# Patient Record
Sex: Female | Born: 1935 | Race: White | Hispanic: No | Marital: Married | Smoking: Former smoker
Health system: Southern US, Community
[De-identification: ages and names within clinical notes are randomized; demographics above are authoritative.]

## PROBLEM LIST (undated history)

## (undated) DIAGNOSIS — E039 Hypothyroidism, unspecified: Secondary | ICD-10-CM

## (undated) DIAGNOSIS — K649 Unspecified hemorrhoids: Secondary | ICD-10-CM

## (undated) DIAGNOSIS — E871 Hypo-osmolality and hyponatremia: Secondary | ICD-10-CM

## (undated) DIAGNOSIS — L600 Ingrowing nail: Secondary | ICD-10-CM

## (undated) DIAGNOSIS — I10 Essential (primary) hypertension: Secondary | ICD-10-CM

## (undated) DIAGNOSIS — Z8639 Personal history of other endocrine, nutritional and metabolic disease: Secondary | ICD-10-CM

## (undated) DIAGNOSIS — N390 Urinary tract infection, site not specified: Secondary | ICD-10-CM

## (undated) DIAGNOSIS — H9190 Unspecified hearing loss, unspecified ear: Secondary | ICD-10-CM

## (undated) DIAGNOSIS — C4490 Unspecified malignant neoplasm of skin, unspecified: Secondary | ICD-10-CM

## (undated) DIAGNOSIS — J309 Allergic rhinitis, unspecified: Secondary | ICD-10-CM

## (undated) DIAGNOSIS — G4730 Sleep apnea, unspecified: Secondary | ICD-10-CM

## (undated) DIAGNOSIS — Z923 Personal history of irradiation: Secondary | ICD-10-CM

## (undated) DIAGNOSIS — C50919 Malignant neoplasm of unspecified site of unspecified female breast: Secondary | ICD-10-CM

## (undated) DIAGNOSIS — D751 Secondary polycythemia: Secondary | ICD-10-CM

## (undated) DIAGNOSIS — I773 Arterial fibromuscular dysplasia: Secondary | ICD-10-CM

## (undated) DIAGNOSIS — R42 Dizziness and giddiness: Secondary | ICD-10-CM

## (undated) DIAGNOSIS — G4733 Obstructive sleep apnea (adult) (pediatric): Secondary | ICD-10-CM

## (undated) DIAGNOSIS — H9201 Otalgia, right ear: Secondary | ICD-10-CM

## (undated) DIAGNOSIS — F0390 Unspecified dementia without behavioral disturbance: Secondary | ICD-10-CM

## (undated) HISTORY — PX: APPENDECTOMY: SHX54

## (undated) HISTORY — DX: Malignant neoplasm of unspecified site of unspecified female breast: C50.919

## (undated) HISTORY — DX: Secondary polycythemia: D75.1

## (undated) HISTORY — DX: Hypothyroidism, unspecified: E03.9

## (undated) HISTORY — DX: Allergic rhinitis, unspecified: J30.9

## (undated) HISTORY — DX: Unspecified hemorrhoids: K64.9

## (undated) HISTORY — DX: Arterial fibromuscular dysplasia: I77.3

## (undated) HISTORY — DX: Dizziness and giddiness: R42

## (undated) HISTORY — DX: Unspecified malignant neoplasm of skin, unspecified: C44.90

## (undated) HISTORY — DX: Essential (primary) hypertension: I10

## (undated) HISTORY — DX: Personal history of irradiation: Z92.3

## (undated) HISTORY — DX: Sleep apnea, unspecified: G47.30

## (undated) HISTORY — DX: Ingrowing nail: L60.0

## (undated) HISTORY — DX: Unspecified hearing loss, unspecified ear: H91.90

## (undated) HISTORY — DX: Otalgia, right ear: H92.01

## (undated) HISTORY — DX: Unspecified dementia, unspecified severity, without behavioral disturbance, psychotic disturbance, mood disturbance, and anxiety: F03.90

## (undated) HISTORY — DX: Hypo-osmolality and hyponatremia: E87.1

## (undated) HISTORY — DX: Urinary tract infection, site not specified: N39.0

## (undated) HISTORY — DX: Personal history of other endocrine, nutritional and metabolic disease: Z86.39

---

## 1995-05-04 HISTORY — PX: OTHER SURGICAL HISTORY: SHX169

## 2003-12-11 ENCOUNTER — Ambulatory Visit: Payer: Self-pay | Admitting: Internal Medicine

## 2004-09-17 ENCOUNTER — Ambulatory Visit: Payer: Self-pay | Admitting: Internal Medicine

## 2005-09-19 ENCOUNTER — Ambulatory Visit: Payer: Self-pay | Admitting: Internal Medicine

## 2006-02-07 ENCOUNTER — Ambulatory Visit: Payer: Self-pay | Admitting: Internal Medicine

## 2006-02-12 ENCOUNTER — Inpatient Hospital Stay: Payer: Self-pay | Admitting: Specialist

## 2006-02-12 ENCOUNTER — Other Ambulatory Visit: Payer: Self-pay

## 2006-07-25 ENCOUNTER — Ambulatory Visit: Payer: Self-pay | Admitting: Internal Medicine

## 2006-09-22 ENCOUNTER — Ambulatory Visit: Payer: Self-pay | Admitting: Internal Medicine

## 2007-02-12 ENCOUNTER — Ambulatory Visit: Payer: Self-pay | Admitting: Ophthalmology

## 2007-02-19 ENCOUNTER — Ambulatory Visit: Payer: Self-pay | Admitting: Ophthalmology

## 2007-07-18 ENCOUNTER — Ambulatory Visit: Payer: Self-pay | Admitting: Unknown Physician Specialty

## 2007-09-27 ENCOUNTER — Ambulatory Visit: Payer: Self-pay | Admitting: Internal Medicine

## 2008-01-25 HISTORY — PX: FRACTURE SURGERY: SHX138

## 2008-11-20 ENCOUNTER — Ambulatory Visit: Payer: Self-pay | Admitting: Internal Medicine

## 2009-01-24 DIAGNOSIS — Z923 Personal history of irradiation: Secondary | ICD-10-CM

## 2009-01-24 HISTORY — DX: Personal history of irradiation: Z92.3

## 2009-11-24 ENCOUNTER — Ambulatory Visit: Payer: Self-pay | Admitting: Internal Medicine

## 2009-12-08 ENCOUNTER — Ambulatory Visit: Payer: Self-pay | Admitting: Internal Medicine

## 2010-01-24 HISTORY — PX: OTHER SURGICAL HISTORY: SHX169

## 2010-01-24 HISTORY — PX: BREAST LUMPECTOMY: SHX2

## 2011-11-10 DIAGNOSIS — H9201 Otalgia, right ear: Secondary | ICD-10-CM | POA: Insufficient documentation

## 2011-11-10 HISTORY — DX: Otalgia, right ear: H92.01

## 2011-12-26 DIAGNOSIS — R42 Dizziness and giddiness: Secondary | ICD-10-CM

## 2011-12-26 HISTORY — DX: Dizziness and giddiness: R42

## 2012-02-14 ENCOUNTER — Ambulatory Visit: Payer: Self-pay | Admitting: Ophthalmology

## 2012-02-20 ENCOUNTER — Ambulatory Visit: Payer: Self-pay | Admitting: Ophthalmology

## 2012-03-23 DIAGNOSIS — H9190 Unspecified hearing loss, unspecified ear: Secondary | ICD-10-CM | POA: Insufficient documentation

## 2012-05-29 DIAGNOSIS — L989 Disorder of the skin and subcutaneous tissue, unspecified: Secondary | ICD-10-CM | POA: Insufficient documentation

## 2012-11-12 ENCOUNTER — Ambulatory Visit: Payer: Self-pay | Admitting: Unknown Physician Specialty

## 2014-02-17 DIAGNOSIS — C50411 Malignant neoplasm of upper-outer quadrant of right female breast: Secondary | ICD-10-CM | POA: Insufficient documentation

## 2014-03-25 DIAGNOSIS — L600 Ingrowing nail: Secondary | ICD-10-CM

## 2014-03-25 HISTORY — DX: Ingrowing nail: L60.0

## 2014-05-16 NOTE — Op Note (Signed)
DATE OF BIRTH:  1935-04-24  DATE OF PROCEDURE:  02/20/2012  PREOPERATIVE DIAGNOSIS:  Cataract, left eye.   POSTOPERATIVE DIAGNOSIS:  Cataract, left eye.   PROCEDURE PERFORMED:  Extracapsular cataract extraction using phacoemulsification with placement of Alcon [id number] 21.0 diopter posterior chamber lens, serial number [id number].   ANESTHESIA:  Four percent lidocaine, 0.75% Marcaine, a 50-50 mixture of 10 units/mL of Hylenex added given as a peribulbar.   ANESTHESIOLOGIST:  Dr. [facility] Service.   COMPLICATIONS:  None.  ESTIMATED BLOOD LOSS:  Less than 1 mL.   DESCRIPTION OF PROCEDURE:  The patient was brought to the operating room and given a peribulbar block.  The patient was then prepped and draped in the usual fashion.  The vertical rectus muscles were imbricated using 5-0 silk sutures.  These sutures were then clamped to the sterile drapes as bridle sutures.  A limbal peritomy was performed extending two clock hours and hemostasis was obtained with cautery.  A partial thickness scleral groove was made at the surgical limbus and dissected anteriorly in a lamellar dissection using an Alcon crescent knife.  The anterior chamber was entered supero-temporally with a Superblade and through the lamellar dissection with a 2.6 mm keratome.  DisCoVisc was used to replace the aqueous and a continuous tear capsulorrhexis was carried out.  Hydrodissection and hydrodelineation were carried out with balanced salt and a 27 gauge canula.  The nucleus was rotated to confirm the effectiveness of the hydrodissection.  Phacoemulsification was carried out using a divide-and-conquer technique.  Total ultrasound time was 1 minute and 52 seconds with an average power of 17.2 percent, CDE of 32.81.  Irrigation/aspiration was used to remove the residual cortex.  DisCoVisc was used to inflate the capsule and the internal incision was enlarged to 3 mm with the crescent knife.  The intraocular lens was folded and  inserted into the capsular bag using the [employer].  Irrigation/aspiration was used to remove the residual DisCoVisc.  Miostat was injected into the anterior chamber through the paracentesis track to inflate the anterior chamber and induce miosis.  The wound was checked for leaks and none were found. The conjunctiva was closed with cautery and the bridle sutures were removed.  Two drops of 0.3% Vigamox were placed on the eye.   An eye shield was placed on the eye.  The patient was discharged to the recovery room in good condition.  An AcrySert delivery system was used without a Librarian, academic. A tenth of a milliliter of cefuroxime was injected into the anterior chamber via the paracentesis tract.    ____________________________ Loura Back. Trevor Duty, MD sad:ms D: 02/20/2012 13:22:18 ET T: 02/20/2012 22:05:49 ET JOB#: [id number]  cc: Remo Lipps A. Ysenia Filice, MD, <Dictator> Martie Lee MD ELECTRONICALLY SIGNED 02/27/2012 12:43

## 2014-10-17 ENCOUNTER — Encounter: Payer: Self-pay | Admitting: Internal Medicine

## 2014-10-17 ENCOUNTER — Inpatient Hospital Stay: Payer: Medicare Other

## 2014-10-17 ENCOUNTER — Inpatient Hospital Stay: Payer: Medicare Other | Attending: Internal Medicine | Admitting: Internal Medicine

## 2014-10-17 ENCOUNTER — Telehealth: Payer: Self-pay | Admitting: *Deleted

## 2014-10-17 ENCOUNTER — Encounter (INDEPENDENT_AMBULATORY_CARE_PROVIDER_SITE_OTHER): Payer: Self-pay

## 2014-10-17 ENCOUNTER — Ambulatory Visit: Payer: Self-pay

## 2014-10-17 VITALS — BP 148/84 | HR 66 | Resp 18

## 2014-10-17 VITALS — BP 141/95 | HR 90 | Temp 98.7°F | Resp 18 | Ht 66.5 in | Wt 132.7 lb

## 2014-10-17 DIAGNOSIS — I10 Essential (primary) hypertension: Secondary | ICD-10-CM

## 2014-10-17 DIAGNOSIS — Z79899 Other long term (current) drug therapy: Secondary | ICD-10-CM

## 2014-10-17 DIAGNOSIS — Z87891 Personal history of nicotine dependence: Secondary | ICD-10-CM | POA: Diagnosis not present

## 2014-10-17 DIAGNOSIS — D751 Secondary polycythemia: Secondary | ICD-10-CM | POA: Diagnosis present

## 2014-10-17 DIAGNOSIS — I701 Atherosclerosis of renal artery: Secondary | ICD-10-CM | POA: Diagnosis not present

## 2014-10-17 DIAGNOSIS — E871 Hypo-osmolality and hyponatremia: Secondary | ICD-10-CM

## 2014-10-17 DIAGNOSIS — G4733 Obstructive sleep apnea (adult) (pediatric): Secondary | ICD-10-CM | POA: Diagnosis not present

## 2014-10-17 DIAGNOSIS — Z853 Personal history of malignant neoplasm of breast: Secondary | ICD-10-CM

## 2014-10-17 DIAGNOSIS — Z9223 Personal history of estrogen therapy: Secondary | ICD-10-CM

## 2014-10-17 DIAGNOSIS — R634 Abnormal weight loss: Secondary | ICD-10-CM

## 2014-10-17 DIAGNOSIS — F0390 Unspecified dementia without behavioral disturbance: Secondary | ICD-10-CM

## 2014-10-17 DIAGNOSIS — Z85828 Personal history of other malignant neoplasm of skin: Secondary | ICD-10-CM

## 2014-10-17 DIAGNOSIS — Z923 Personal history of irradiation: Secondary | ICD-10-CM

## 2014-10-17 DIAGNOSIS — R5383 Other fatigue: Secondary | ICD-10-CM

## 2014-10-17 DIAGNOSIS — E039 Hypothyroidism, unspecified: Secondary | ICD-10-CM

## 2014-10-17 LAB — CBC WITH DIFFERENTIAL/PLATELET
BASOS ABS: 0 10*3/uL (ref 0–0.1)
Basophils Relative: 0 %
Eosinophils Absolute: 0 10*3/uL (ref 0–0.7)
Eosinophils Relative: 1 %
HEMATOCRIT: 47.2 % — AB (ref 35.0–47.0)
HEMOGLOBIN: 16.1 g/dL — AB (ref 12.0–16.0)
LYMPHS ABS: 0.9 10*3/uL — AB (ref 1.0–3.6)
LYMPHS PCT: 14 %
MCH: 33 pg (ref 26.0–34.0)
MCHC: 34.1 g/dL (ref 32.0–36.0)
MCV: 96.7 fL (ref 80.0–100.0)
Monocytes Absolute: 0.6 10*3/uL (ref 0.2–0.9)
Monocytes Relative: 9 %
NEUTROS ABS: 4.9 10*3/uL (ref 1.4–6.5)
NEUTROS PCT: 76 %
PLATELETS: 267 10*3/uL (ref 150–440)
RBC: 4.88 MIL/uL (ref 3.80–5.20)
RDW: 13.4 % (ref 11.5–14.5)
WBC: 6.3 10*3/uL (ref 3.6–11.0)

## 2014-10-17 LAB — COMPREHENSIVE METABOLIC PANEL
ALT: 28 U/L (ref 14–54)
AST: 29 U/L (ref 15–41)
Albumin: 4.6 g/dL (ref 3.5–5.0)
Alkaline Phosphatase: 69 U/L (ref 38–126)
Anion gap: 5 (ref 5–15)
BUN: 12 mg/dL (ref 6–20)
CO2: 29 mmol/L (ref 22–32)
CREATININE: 0.62 mg/dL (ref 0.44–1.00)
Calcium: 9.3 mg/dL (ref 8.9–10.3)
Chloride: 95 mmol/L — ABNORMAL LOW (ref 101–111)
Glucose, Bld: 109 mg/dL — ABNORMAL HIGH (ref 65–99)
POTASSIUM: 4.1 mmol/L (ref 3.5–5.1)
SODIUM: 129 mmol/L — AB (ref 135–145)
Total Bilirubin: 0.9 mg/dL (ref 0.3–1.2)
Total Protein: 7.6 g/dL (ref 6.5–8.1)

## 2014-10-17 LAB — LACTATE DEHYDROGENASE: LDH: 135 U/L (ref 98–192)

## 2014-10-17 NOTE — Progress Notes (Signed)
[facility] CONSULT NOTE  PCP: Dr.Jasmine Candiss Norse MD  CHIEF COMPLAINTS/PURPOSE OF CONSULTATION: Polycythemia  ONCOLOGIC HISTORY:  # 2012- STAGE I RIGHT BREAST CA [[facility]] ER/PR-POS; Her-2 NEG s/p LUMEPC- RT;ONCTYPE- RS 19; NO CHEMO; INTOL to AI.   # 2016- ISOLATED ERYTHROCYTOSIS  HISTORY OF PRESENTING ILLNESS:  Laura Carpenter 79 y.o. female  with multiple problems including obstructive sleep apnea on CPAP; with remote history of smoking; prior history of renal artery stenosis and also history of dementia was noted on routine labs to have elevated hemoglobin of 16.6 and hematocrit of 48.8. Upon the trend of her labs noted that- patient's hemoglobin started going up the last 6 months. Patient's white count and platelet count was within normal limits.  Patient complains of mild-to-moderate fatigue over the last 3 months. Patient denies any new headaches or vision changes. Denies any discoloration of her fingertips and toes.  Patient has mild memory problems; recently started on Aricept.   REVIEW OF SYSTEMS:   A complete 10 point review of system is done is negative for mentioned above in history of present illness   MEDICAL HISTORY:  Past Medical History  Diagnosis Date  . Breast cancer     02/17/10: right lumpectomy reveals 0.8 cm of invasive ductal carcinoma with 1/3 LN+ for isolated tumor cell cluster in Oncotype recurrent score 19 or 12% chance of distant recurrence; Femara self-discontinued secondary to side effects. She continues to decline further therapy  . History of radiation therapy 2011  . Sleep apnea   . Hemorrhoid   . Hypothyroidism   . Skin cancer     squamous cell skin cancer removed by Dermatology--arms and hands  . Dementia   . Dizziness   . Hypertension   . Fibromuscular dysplasia of renal artery     1997 Renal angioplasty at [facility]. She subsequently underwent repeat angioplasty a few years later. 12/2009 Abdominal Duplex: <60% bilateral RAS, Sequential  narrowing and dilations of renal arteries, suggests fibromuscular dysplasia.   . Otalgia of right ear 11/10/2011  . Chronic hyponatremia     Hyponatremia for which she has had since somewhere in the mid 1990's. She's had multiple hospitalization, one hospitalization in 1993, she was diagnosed with an EF of 25% as well, though coronaries were clean during that hospitalization. The cath showed an EF of 50%. She is being worked up for her hyponatremia at [facility].  . H/O electrolyte imbalance   . Ingrowing nail, left great toe   . Polycythemia   . Allergic rhinitis   . Hearing loss     SURGICAL HISTORY: Past Surgical History  Procedure Laterality Date  . Breast lumpectomy Right 2012    performed [facility]  . Core biopsy breast Right 2012    ultrasound guided core needle biopsy of the right breast reveals invasive ductal carcinoma, ER/PR positive, HER2 neu   . Fracture surgery  2010  . Angioplasty of right renal artery  05/04/1995    SOCIAL HISTORY: Social History   Social History  . Marital Status: Married    Spouse Name: N/A  . Number of Children: N/A  . Years of Education: N/A   Occupational History  . Not on file.   Social History Main Topics  . Smoking status: Former Smoker -- 0.25 packs/day for 20 years    Types: Cigarettes  . Smokeless tobacco: Former Systems developer    Quit date: 05/30/1976     Comment: early teens; early college smoking Quit: 05/30/1976  . Alcohol Use:  0.0 oz/week    0 Standard drinks or equivalent per week     Comment: occasional social drinker  . Drug Use: No  . Sexual Activity:    Partners: Male   Other Topics Concern  . Not on file   Social History Narrative  . No narrative on file    FAMILY HISTORY: Family History  Problem Relation Age of Onset  . Breast cancer Mother   . Hypertension Mother   . Alcohol abuse Father   . Cirrhosis Father   . Hypertension Father   . Heart disease Brother   . Hypertension Daughter   . Skin cancer Daughter      ALLERGIES:  has No Known Allergies.  MEDICATIONS:  Current Outpatient Prescriptions  Medication Sig Dispense Refill  . cyanocobalamin (,VITAMIN B-12,) 1000 MCG/ML injection Inject 1,000 mcg into the muscle every 30 (thirty) days.    Marland Kitchen desonide (DESOWEN) 0.05 % cream Apply 1 application topically 2 (two) times daily.    Marland Kitchen amLODipine (NORVASC) 5 MG tablet Take 1 tablet by mouth daily.  3  . Calcium-Vitamin D-Vitamin K 500-100-40 MG-UNT-MCG CHEW Chew 1 tablet by mouth daily.    . Cranberry Fruit 405 MG CAPS Take 1 capsule by mouth daily.    Marland Kitchen donepezil (ARICEPT) 5 MG tablet Take 1 tablet by mouth daily.  0  . fluticasone (FLONASE) 50 MCG/ACT nasal spray Place 2 sprays into both nostrils daily.  4  . levothyroxine (SYNTHROID, LEVOTHROID) 100 MCG tablet Take 1 tablet by mouth daily.  2  . lisinopril (PRINIVIL,ZESTRIL) 40 MG tablet Take 1 tablet by mouth daily.  8   No current facility-administered medications for this visit.      PHYSICAL EXAMINATION: ECOG PERFORMANCE STATUS: 1 - Symptomatic but completely ambulatory  Filed Vitals:   10/17/14 0934  BP: 141/95  Pulse: 90  Temp: 98.7 F (37.1 C)  Resp: 18   Filed Weights   10/17/14 0934  Weight: 132 lb 11.5 oz (60.201 kg)    GENERAL:alert, no distress and comfortable; accompanied by her husband. SKIN: skin color, texture, turgor are normal, no rashes or significant lesions EYES: normal, conjunctiva are pink and non-injected, sclera clear OROPHARYNX:no exudate, no erythema and lips, buccal mucosa, and tongue normal  NECK: supple, thyroid normal size, non-tender, without nodularity LYMPH:  no palpable lymphadenopathy in the cervical, axillary or inguinal LUNGS: clear to auscultation and percussion with normal breathing effort HEART: regular rate & rhythm and no murmurs and no lower extremity edema ABDOMEN:abdomen soft, non-tender and normal bowel sounds Musculoskeletal:no cyanosis of digits and no clubbing  PSYCH: alert &  oriented x 3 with fluent speech NEURO: no focal motor/sensory deficits  LABORATORY DATA:    ASSESSMENT & PLAN:   Isolated erythrocytosis- likely secondary erythrocytosis from ongoing obstructive sleep apnea. Patient is awaiting repeat workup for a new CPAP machine. However checked jak-2 mutation testing. Add erythropoietin.  # Weight loss- recommend checking CT of the abdomen and pelvis/ uterine tumors-renal Tumors also cause erythrocytosis.  # I recommend phlebotomy of 2 50 mL of blood today; the treatment symptomatically help patient fatigue. I recommend starting aspirin 81 mg a day.   # History of breast cancer early stage  2012; clinically no concerns for recurrence  She'll follow-up with me in approximately 2-3 weeks.   Thank you Dr. Candiss Norse for allowing me to participate the care of your pleasant patient.    All questions were answered. The patient knows to call the clinic with any problems,  questions or concerns. I spent 25 minutes counseling the patient face to face. The total time spent in the appointment was 40 minutes and more than 50% was on counseling.     Cammie Sickle, MD 10/17/2014 10:01 AM

## 2014-10-17 NOTE — Telephone Encounter (Signed)
Left msg. Patient did not get EPO drawn in clinic today. pt need labs drawn for full diagnostic workup. Asked patient to call cancer center back to arrange for this draw.

## 2014-10-17 NOTE — Patient Instructions (Signed)
Polycythemia Vera  Polycythemia Laura Carpenter is a condition in which the body makes too many red blood cells and there is no known cause. The red blood cells (erythrocytes) are the cells which carry the oxygen in your blood stream to the cells of your body. Because of the increased red blood cells, the blood becomes thicker and does not circulate as well. It would be similar to your car having oil which is too thick so it cannot start and circulate as well. When the blood is too thick it often causes headaches and dizziness. It may also cause blood clots. Even though the blood clots easier, these patients bleed easier. The bleeding is caused because the blood cells which help stop bleeding (platelets) do not function normally. It occurs in all age groups but is more common in the 30 to 12 year age range. TREATMENT  The treatment of polycythemia vera for many years has been blood removal (phlebotomy) which is similar to blood removal in a blood bank, however this blood is not used for donation. Hydroxyurea is used to supplement phlebotomy. Aspirin is commonly given to thin the blood as long as the patient does not have a problem with bleeding. Other drugs are used based on the progression of the disease. Document Released: 10/05/2000 Document Revised: 04/04/2011 Document Reviewed: 04/11/2008 [facility] Patient Information 2015 [place], [place]. This information is not intended to replace advice given to you by your health care provider. Make sure you discuss any questions you have with your health care provider.  Therapeutic Phlebotomy Therapeutic phlebotomy is the controlled removal of blood from your body for the purpose of treating a medical condition. It is similar to donating blood. Usually, about a pint (470 mL) of blood is removed. The average adult has 9 to 12 pints (4.3 to 5.7 L) of blood. Therapeutic phlebotomy may be used to treat the following medical conditions:  Hemochromatosis. This is a condition in  which there is too much iron in the blood.  Polycythemia vera. This is a condition in which there are too many red cells in the blood.  Porphyria cutanea tarda. This is a disease usually passed from one generation to the next (inherited). It is a condition in which an important part of hemoglobin is not made properly. This results in the build up of abnormal amounts of porphyrins in the body.  Sickle cell disease. This is an inherited disease. It is a condition in which the red blood cells form an abnormal crescent shape rather than a round shape. LET YOUR CAREGIVER KNOW ABOUT:  Allergies.  Medicines taken including herbs, eyedrops, over-the-counter medicines, and creams.  Use of steroids (by mouth or creams).  Previous problems with anesthetics or numbing medicine.  History of blood clots.  History of bleeding or blood problems.  Previous surgery.  Possibility of pregnancy, if this applies. RISKS AND COMPLICATIONS This is a simple and safe procedure. Problems are unlikely. However, problems can occur and may include:  Nausea or lightheadedness.  Low blood pressure.  Soreness, bleeding, swelling, or bruising at the needle insertion site.  Infection. BEFORE THE PROCEDURE  This is a procedure that can be done as an outpatient. Confirm the time that you need to arrive for your procedure. Confirm whether there is a need to fast or withhold any medications. It is helpful to wear clothing with sleeves that can be raised above the elbow. A blood sample may be done to determine the amount of red blood cells or iron in  your blood. Plan ahead of time to have someone drive you home after the procedure. PROCEDURE The entire procedure from preparation through recovery takes about 1 hour. The actual collection takes about 10 to 15 minutes.  A needle will be inserted into your vein.  Tubing and a collection bag will be attached to that needle.  Blood will flow through the needle and  tubing into the collection bag.  You may be asked to open and close your hand slowly and continuously during the entire collection.  Once the specified amount of blood has been removed from your body, the collection bag and tubing will be clamped.  The needle will be removed.  Pressure will be held on the site of the needle insertion to stop the bleeding. Then a bandage will be placed over the needle insertion site. AFTER THE PROCEDURE  Your recovery will be assessed and monitored. If there are no problems, as an outpatient, you should be able to go home shortly after the procedure.  Document Released: 06/14/2010 Document Revised: 04/04/2011 Document Reviewed: 06/14/2010 [facility] Patient Information 2015 [place], [place]. This information is not intended to replace advice given to you by your health care provider. Make sure you discuss any questions you have with your health care provider.  Therapeutic Phlebotomy, Care After Refer to this sheet in the next few weeks. These instructions provide you with information on caring for yourself after your procedure. Your caregiver may also give you more specific instructions. Your treatment has been planned according to current medical practices, but problems sometimes occur. Call your caregiver if you have any problems or questions after your procedure. HOME CARE INSTRUCTIONS Most people can go back to their normal activities right away. Before you leave, be sure to ask if there is anything you should or should not do. In general, it would be wise to:  Keep the bandage dry. You can remove the bandage after about 5 hours.  Eat well-balanced meals for the next 24 hours.  Drink enough fluids to keep your urine clear or pale yellow.  Avoid drinking alcohol minimally until after eating.  Avoid smoking for at least 30 minutes after the procedure.  Avoid strenuous physical activity or heavy lifting or pulling for about 5 hours after the  procedure.  Athletes should avoid strenuous exercise for 12 hours or more.  Change positions slowly for the remainder of the day to prevent light-headedness or fainting.  If you feel light-headed, lie down until the feeling subsides.  If you have bleeding from the needle insertion site, elevate your arm and press firmly on the site until the bleeding stops.  If bruising or bleeding appears under the skin, apply ice to the area for 15 to 20 minutes, 3 to 4 times per day. Put the ice in a plastic bag and place a towel between the bag of ice and your skin. Do this while you are awake for the first 24 hours. The ice packs can be stopped before 24 hours if the swelling goes away. If swelling persists after 24 hours, a warm, moist washcloth can be applied to the area for 15 to 20 minutes, 3 to 4 times per day. The warm, moist treatments can be stopped when the swelling goes away.  It is important to continue further therapeutic phlebotomy as directed by your caregiver. SEEK MEDICAL CARE IF:  There is bleeding or fluid leaking from the needle insertion site.  The needle insertion site becomes swollen, red, or sore.  You feel light-headed, dizzy or nauseated, and the feeling does not go away.  You notice new bruising at the needle insertion site.  You feel more weak or tired than normal.  You develop a fever. SEEK IMMEDIATE MEDICAL CARE IF:   There is increased bleeding, pain, or swelling from the needle insertion site.  You have severe nausea or vomiting.  You have chest pain.  You have trouble breathing. MAKE SURE YOU:  Understand these instructions.  Will watch your condition.  Will get help right away if you are not doing well or get worse. Document Released: 06/14/2010 Document Revised: 05/27/2013 Document Reviewed: 06/14/2010 [facility] Patient Information 2015 [place], [place]. This information is not intended to replace advice given to you by your health care provider. Make  sure you discuss any questions you have with your health care provider.

## 2014-10-20 ENCOUNTER — Inpatient Hospital Stay: Payer: Medicare Other

## 2014-10-20 DIAGNOSIS — D751 Secondary polycythemia: Secondary | ICD-10-CM | POA: Diagnosis not present

## 2014-10-20 NOTE — Telephone Encounter (Signed)
Husband called back this morning. Will bring his wife for the EPO level today at 1pm.

## 2014-10-21 ENCOUNTER — Ambulatory Visit: Payer: Medicare Other | Attending: Internal Medicine

## 2014-10-21 DIAGNOSIS — R531 Weakness: Secondary | ICD-10-CM | POA: Diagnosis present

## 2014-10-21 DIAGNOSIS — R2681 Unsteadiness on feet: Secondary | ICD-10-CM

## 2014-10-21 LAB — ERYTHROPOIETIN: Erythropoietin: 9.8 m[IU]/mL (ref 2.6–18.5)
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
## 2014-10-21 NOTE — Patient Instructions (Signed)
[email or web address] Sit to stands 2x10

## 2014-10-22 ENCOUNTER — Ambulatory Visit: Payer: Medicare Other | Attending: Otolaryngology

## 2014-10-22 DIAGNOSIS — G4733 Obstructive sleep apnea (adult) (pediatric): Secondary | ICD-10-CM | POA: Diagnosis present

## 2014-10-24 ENCOUNTER — Ambulatory Visit
Admission: RE | Admit: 2014-10-24 | Discharge: 2014-10-24 | Disposition: A | Discharge status: Home / Self Care | Payer: Medicare Other | Source: Ambulatory Visit | Attending: Internal Medicine | Admitting: Internal Medicine

## 2014-10-24 DIAGNOSIS — I700 Atherosclerosis of aorta: Secondary | ICD-10-CM | POA: Insufficient documentation

## 2014-10-24 DIAGNOSIS — I313 Pericardial effusion (noninflammatory): Secondary | ICD-10-CM | POA: Diagnosis not present

## 2014-10-24 DIAGNOSIS — R634 Abnormal weight loss: Secondary | ICD-10-CM

## 2014-10-24 LAB — JAK2 GENOTYPR

## 2014-10-24 MED ORDER — IOHEXOL 300 MG/ML  SOLN
100.0000 mL | Freq: Once | INTRAMUSCULAR | Status: AC | PRN
  Administered 2014-10-24: 85 mL via INTRAVENOUS

## 2014-10-27 ENCOUNTER — Ambulatory Visit: Payer: Medicare Other | Attending: Internal Medicine

## 2014-10-27 DIAGNOSIS — R531 Weakness: Secondary | ICD-10-CM

## 2014-10-27 DIAGNOSIS — R2681 Unsteadiness on feet: Secondary | ICD-10-CM | POA: Diagnosis present

## 2014-10-28 NOTE — Therapy (Signed)
[place] MAIN [facility] SERVICES [address] [place], [place], [address] Phone: [phone]   Fax:  [phone]  Physical Therapy Treatment  Patient Details  Name: Laura Carpenter MRN: [id number] Date of Birth: Jan 10, 1936 Referring Provider:  Glendon Axe, MD  Encounter Date: 10/27/2014      PT End of Session - 10/28/14 0911    Visit Number 2   Number of Visits 9   Date for PT Re-Evaluation 11/18/14   Authorization Type 03-27-22 G codes   PT Start Time 8676   PT Stop Time 1430   PT Time Calculation (min) 45 min   Equipment Utilized During Treatment Gait belt   Activity Tolerance Patient tolerated treatment well   Behavior During Therapy [facility] for tasks assessed/performed      Past Medical History  Diagnosis Date  . History of radiation therapy 2011  . Sleep apnea   . Hemorrhoid   . Hypothyroidism   . Dementia   . Dizziness   . Hypertension   . Fibromuscular dysplasia of renal artery ([place])     1997 Renal angioplasty at [facility]. She subsequently underwent repeat angioplasty a few years later. 12/2009 Abdominal Duplex: <60% bilateral RAS, Sequential narrowing and dilations of renal arteries, suggests fibromuscular dysplasia.   . Otalgia of right ear 11/10/2011  . Chronic hyponatremia     Hyponatremia for which she has had since somewhere in the mid 1990's. She's had multiple hospitalization, one hospitalization in 1993, she was diagnosed with an EF of 25% as well, though coronaries were clean during that hospitalization. The cath showed an EF of 50%. She is being worked up for her hyponatremia at [facility].  . H/O electrolyte imbalance   . Ingrowing nail, left great toe   . Polycythemia   . Allergic rhinitis   . Hearing loss   . Breast cancer ([place])     02/17/10: right lumpectomy reveals 0.8 cm of invasive ductal carcinoma with 1/3 LN+ for isolated tumor cell cluster in Oncotype recurrent score 19 or 12% chance of distant recurrence; Femara  self-discontinued secondary to side effects. She continues to decline further therapy  . Skin cancer     squamous cell skin cancer removed by Dermatology--arms and hands    Past Surgical History  Procedure Laterality Date  . Breast lumpectomy Right 2012    performed [facility]  . Core biopsy breast Right 2012    ultrasound guided core needle biopsy of the right breast reveals invasive ductal carcinoma, ER/PR positive, HER2 neu   . Fracture surgery  2010  . Angioplasty of right renal artery  05/04/1995    There were no vitals filed for this visit.  Visit Diagnosis:  Unsteadiness on feet  Weakness      Subjective Assessment - 10/28/14 0909    Subjective Pt has been compliant with HEP and hast started walking more   Patient Stated Goals would like to walk normal and go and down steps   Currently in Pain? No/denies   Pain Score 0-No pain         Nustep x4 min level 5: no charge,  Vitals were taken per history high blood pressure:  143/76 mmHg, 80 bpm Bilateral leg press with 60# 2x10 Sit to stand x10 Squats x10 Standing hip extension/abduction red band above knees 2x10 each LE Heel raises 2x10 Squats on AIREX X10 in //bars Eccentric step downs from 4 inch step x10 each LE Supine SLR x10 each LE  Neuro re-ed: Static  standing on AIREX with head turns (up, down, left, right) x2 min, pt required min UE when looking up Static standing on AIREX with UE movements x2 min Marching in //bars x 2 laps with instruction to maintain SLS as long as possible before advancing LEs, pt was able to maintain SLS ~1-2 sec bilaterally   pt required verbal and visual cues for correct exercise technique                        PT Education - 10/28/14 0911    Education provided Yes   Education Details plan of care and new exercises for HEP   Person(s) Educated Patient   Methods Explanation   Comprehension Verbalized understanding             PT Long Term Goals - 10/21/14  1730    PT LONG TERM GOAL #1   Title pt will be able to ascend/descend 4 steps with step over step pattern and without UE assist   Baseline pt ascends steps with step to step pattern without UE assist   Time 4   Period Weeks   Status New   PT LONG TERM GOAL #2   Title pt's 5x sit to stand will be less than 10 seconds indicating improved LE functional strength    Baseline 16.26 sec without UE assist   Period Days   Status New   PT LONG TERM GOAL #3   Title pt's timed up and go time will be less than 10 seconds indicating decreased fall risk   Baseline 12.26 sec   Time 4   Period Weeks   Status New               Plan - 10/28/14 0912    Clinical Impression Statement Pt did will today and was able to ambulate around the gym with close supervision and without an assistive device.  Pt did experience complaint of dizziness during AIREX exercises today and notes she occasionally gets dizzy during swaying motions.     Pt will benefit from skilled therapeutic intervention in order to improve on the following deficits Decreased strength;Decreased balance;Hypomobility;Decreased activity tolerance;Decreased range of motion   Rehab Potential Fair   Clinical Impairments Affecting Rehab Potential decreased right ankle ROM from previous surgery    PT Frequency 2x / week   PT Duration 4 weeks   PT Treatment/Interventions Cryotherapy;Moist Heat;Therapeutic exercise;Therapeutic activities;Functional mobility training;Stair training;Gait training;Balance training;Neuromuscular re-education;Patient/family education;Manual techniques   PT Next Visit Plan progress HEP        Problem List Patient Active Problem List   Diagnosis Date Noted  . Polycythemia, secondary 10/17/2014   Renford Dills, SPT This entire session was performed under direct supervision and direction of a licensed therapist/therapist assistant . I have personally read, edited and approve of the note as written. Gorden Harms.  [name], PT, DPT [phone]  [name],[name] 10/28/2014, 10:37 AM  [place] MAIN [facility] SERVICES [address] [place], [place], [address] Phone: [phone]   Fax:  [phone]

## 2014-10-28 NOTE — Patient Instructions (Signed)
[email or web address] SLR bilaterally 2x10 Standing hip abduction with red band 2x10

## 2014-10-29 ENCOUNTER — Ambulatory Visit: Payer: Medicare Other

## 2014-10-29 DIAGNOSIS — R531 Weakness: Secondary | ICD-10-CM

## 2014-10-29 DIAGNOSIS — R2681 Unsteadiness on feet: Secondary | ICD-10-CM

## 2014-10-30 NOTE — Therapy (Signed)
[place] MAIN [facility] SERVICES [address] [place], [place], [address] Phone: [phone]   Fax:  [phone]  Physical Therapy Treatment  Patient Details  Name: Laura Carpenter MRN: [id number] Date of Birth: 03/26/1935 Referring Provider:  Glendon Axe, MD  Encounter Date: 10/29/2014      PT End of Session - 10/30/14 1002    Visit Number 3   Number of Visits 9   Date for PT Re-Evaluation 11/18/14   Authorization Type 04/12/2022 G codes   PT Start Time 1300   PT Stop Time 1345   PT Time Calculation (min) 45 min   Equipment Utilized During Treatment Gait belt   Activity Tolerance Patient tolerated treatment well   Behavior During Therapy [name]      Past Medical History  Diagnosis Date  . History of radiation therapy 2011  . Sleep apnea   . Hemorrhoid   . Hypothyroidism   . Dementia   . Dizziness   . Hypertension   . Fibromuscular dysplasia of renal artery ([place])     1997 Renal angioplasty at [facility]. She subsequently underwent repeat angioplasty a few years later. 12/2009 Abdominal Duplex: <60% bilateral RAS, Sequential narrowing and dilations of renal arteries, suggests fibromuscular dysplasia.   . Otalgia of right ear 11/10/2011  . Chronic hyponatremia     Hyponatremia for which she has had since somewhere in the mid 1990's. She's had multiple hospitalization, one hospitalization in 1993, she was diagnosed with an EF of 25% as well, though coronaries were clean during that hospitalization. The cath showed an EF of 50%. She is being worked up for her hyponatremia at [employer].  . H/O electrolyte imbalance   . Ingrowing nail, left great toe   . Polycythemia   . Allergic rhinitis   . Hearing loss   . Breast cancer ([place])     02/17/10: right lumpectomy reveals 0.8 cm of invasive ductal carcinoma with 1/3 LN+ for isolated tumor cell cluster in Oncotype recurrent score 19 or 12% chance of distant recurrence; Femara  self-discontinued secondary to side effects. She continues to decline further therapy  . Skin cancer     squamous cell skin cancer removed by Dermatology--arms and hands    Past Surgical History  Procedure Laterality Date  . Breast lumpectomy Right 2012    performed [facility]  . Core biopsy breast Right 2012    ultrasound guided core needle biopsy of the right breast reveals invasive ductal carcinoma, ER/PR positive, HER2 neu   . Fracture surgery  2010  . Angioplasty of right renal artery  05/04/1995    There were no vitals filed for this visit.  Visit Diagnosis:  Unsteadiness on feet  Weakness      Subjective Assessment - 10/30/14 1001    Subjective Pt relates she has been doing well and is heading to the beach today.   pt denies any pain currently.     Patient Stated Goals would like to walk normal and go and down steps   Currently in Pain? No/denies   Pain Score 0-No pain        There ex:  Nustep x4 min level 5: no charge,   Bilateral leg press with 75# 2x10 Heel raises on leg press with 60# 2x10 Sit to stand x10 Standing hip extension/abduction with red band above knees 2x10 each LE Heel raises 2x10 Squats on AIREX 2X10 in //bars Side step on 6 inch step 5x sit to stand  regular pace 5x sit to stand in 15 sec  Neuro re-ed: Step on airex followed by 6 inch step followed by stepping down onto airex and finally down to hard surface x12 with step over step pattern.    Pt ambulated 2x80 ft while performing vertical and horizontal head turns Side stepping on airex beam 2 laps Forward on airex beam x 2 laps  Pt required verbal, visual and tactile cues for correct exercise technique                          PT Education - 10/30/14 1002    Education provided Yes   Education Details LE strengthening and balance training    Person(s) Educated Patient   Methods Explanation   Comprehension Verbalized understanding             PT Long Term Goals  - 10/21/14 1730    PT LONG TERM GOAL #1   Title pt will be able to ascend/descend 4 steps with step over step pattern and without UE assist   Baseline pt ascends steps with step to step pattern without UE assist   Time 4   Period Weeks   Status New   PT LONG TERM GOAL #2   Title pt's 5x sit to stand will be less than 10 seconds indicating improved LE functional strength    Baseline 16.26 sec without UE assist   Period Days   Status New   PT LONG TERM GOAL #3   Title pt's timed up and go time will be less than 10 seconds indicating decreased fall risk   Baseline 12.26 sec   Time 4   Period Weeks   Status New               Plan - 10/30/14 1003    Clinical Impression Statement Pt is able to ambulate around the gym safely without an assistive device.  pt decreases her cadence to  ambulate safely with vertical and horizontal head turns .  Will progress to gait training on unleveled surface next session.     Pt will benefit from skilled therapeutic intervention in order to improve on the following deficits Decreased strength;Decreased balance;Hypomobility;Decreased activity tolerance;Decreased range of motion   Rehab Potential Fair   Clinical Impairments Affecting Rehab Potential decreased right ankle ROM from previous surgery    PT Frequency 2x / week   PT Duration 4 weeks   PT Treatment/Interventions Cryotherapy;Moist Heat;Therapeutic exercise;Therapeutic activities;Functional mobility training;Stair training;Gait training;Balance training;Neuromuscular re-education;Patient/family education;Manual techniques   PT Next Visit Plan progress HEP        Problem List Patient Active Problem List   Diagnosis Date Noted  . Polycythemia, secondary 10/17/2014   Renford Dills, SPT This entire session was performed under direct supervision and direction of a licensed therapist/therapist assistant . I have personally read, edited and approve of the note as written. Gorden Harms. [name],  PT, DPT [phone]  [name],[name] 10/30/2014, 1:44 PM  [place] MAIN [facility] SERVICES [address] [place], [place], [address] Phone: [phone]   Fax:  [phone]

## 2014-10-31 ENCOUNTER — Inpatient Hospital Stay: Payer: Medicare Other | Attending: Internal Medicine

## 2014-10-31 ENCOUNTER — Inpatient Hospital Stay (HOSPITAL_BASED_OUTPATIENT_CLINIC_OR_DEPARTMENT_OTHER): Payer: Medicare Other | Admitting: Internal Medicine

## 2014-10-31 ENCOUNTER — Ambulatory Visit: Payer: Medicare Other | Admitting: Internal Medicine

## 2014-10-31 ENCOUNTER — Other Ambulatory Visit: Payer: Medicare Other

## 2014-10-31 VITALS — BP 168/98 | HR 98 | Temp 98.1°F | Resp 18 | Ht 66.5 in | Wt 127.9 lb

## 2014-10-31 DIAGNOSIS — E039 Hypothyroidism, unspecified: Secondary | ICD-10-CM

## 2014-10-31 DIAGNOSIS — Z923 Personal history of irradiation: Secondary | ICD-10-CM | POA: Diagnosis not present

## 2014-10-31 DIAGNOSIS — D751 Secondary polycythemia: Secondary | ICD-10-CM

## 2014-10-31 DIAGNOSIS — Z170 Estrogen receptor positive status [ER+]: Secondary | ICD-10-CM | POA: Insufficient documentation

## 2014-10-31 DIAGNOSIS — E871 Hypo-osmolality and hyponatremia: Secondary | ICD-10-CM | POA: Diagnosis not present

## 2014-10-31 DIAGNOSIS — Z853 Personal history of malignant neoplasm of breast: Secondary | ICD-10-CM | POA: Diagnosis not present

## 2014-10-31 DIAGNOSIS — Z87891 Personal history of nicotine dependence: Secondary | ICD-10-CM

## 2014-10-31 DIAGNOSIS — G4733 Obstructive sleep apnea (adult) (pediatric): Secondary | ICD-10-CM | POA: Diagnosis not present

## 2014-10-31 DIAGNOSIS — Z85828 Personal history of other malignant neoplasm of skin: Secondary | ICD-10-CM | POA: Diagnosis not present

## 2014-10-31 LAB — CBC WITH DIFFERENTIAL/PLATELET
Basophils Absolute: 0 10*3/uL (ref 0–0.1)
Basophils Relative: 1 %
Eosinophils Absolute: 0.1 10*3/uL (ref 0–0.7)
Eosinophils Relative: 2 %
HEMATOCRIT: 44.5 % (ref 35.0–47.0)
HEMOGLOBIN: 15.2 g/dL (ref 12.0–16.0)
LYMPHS PCT: 21 %
Lymphs Abs: 0.9 10*3/uL — ABNORMAL LOW (ref 1.0–3.6)
MCH: 33 pg (ref 26.0–34.0)
MCHC: 34.2 g/dL (ref 32.0–36.0)
MCV: 96.7 fL (ref 80.0–100.0)
MONO ABS: 0.5 10*3/uL (ref 0.2–0.9)
MONOS PCT: 11 %
NEUTROS ABS: 2.8 10*3/uL (ref 1.4–6.5)
NEUTROS PCT: 65 %
Platelets: 211 10*3/uL (ref 150–440)
RBC: 4.6 MIL/uL (ref 3.80–5.20)
RDW: 13.3 % (ref 11.5–14.5)
WBC: 4.3 10*3/uL (ref 3.6–11.0)

## 2014-10-31 NOTE — Progress Notes (Signed)
.[place] CONSULT NOTE  PCP: Dr.Jasmine Candiss Norse MD  CHIEF COMPLAINTS/PURPOSE OF CONSULTATION: Polycythemia  ONCOLOGIC HISTORY:  # 2016- ISOLATED ERYTHROCYTOSIS likely SECONDARY [ ? OSA vs RAS; Jak-2 NEG]   # 2012- STAGE I RIGHT BREAST CA [[facility]] ER/PR-POS; Her-2 NEG s/p LUMEPC- RT;ONCTYPE- RS 19; NO CHEMO; INTOL to AI.   # OSA on CPAP; Hx Fibromuscular dysplasia [1997 s/p Ang[facility]oplasty [facility]]  HISTORY OF PRESENTING ILLNESS:  Laura Carpenter 79 y.o. female  Patient is here for follow-up/to review the results of her blood work that were done approximately 2 weeks ago for her isolated erythrocytosis.  Patient had phlebotomy 2 weeks ago; she does not feel any significantly different since last time.   REVIEW OF SYSTEMS:   A complete 10 point review of system is done is negative for mentioned above in history of present illness   MEDICAL HISTORY:  Past Medical History  Diagnosis Date  . History of radiation therapy 2011  . Sleep apnea   . Hemorrhoid   . Hypothyroidism   . Dementia   . Dizziness   . Hypertension   . Fibromuscular dysplasia of renal artery ([place])     1997 Renal angioplasty at [facility]. She subsequently underwent repeat angioplasty a few years later. 12/2009 Abdominal Duplex: <60% bilateral RAS, Sequential narrowing and dilations of renal arteries, suggests fibromuscular dysplasia.   . Otalgia of right ear 11/10/2011  . Chronic hyponatremia     Hyponatremia for which she has had since somewhere in the mid 1990's. She's had multiple hospitalization, one hospitalization in 1993, she was diagnosed with an EF of 25% as well, though coronaries were clean during that hospitalization. The cath showed an EF of 50%. She is being worked up for her hyponatremia at [facility].  . H/O electrolyte imbalance   . Ingrowing nail, left great toe   . Polycythemia   . Allergic rhinitis   . Hearing loss   . Breast cancer ([place])     02/17/10: right lumpectomy reveals 0.8 cm of  invasive ductal carcinoma with 1/3 LN+ for isolated tumor cell cluster in Oncotype recurrent score 19 or 12% chance of distant recurrence; Femara self-discontinued secondary to side effects. She continues to decline further therapy  . Skin cancer     squamous cell skin cancer removed by Dermatology--arms and hands    SURGICAL HISTORY: Past Surgical History  Procedure Laterality Date  . Breast lumpectomy Right 2012    performed [facility]  . Core biopsy breast Right 2012    ultrasound guided core needle biopsy of the right breast reveals invasive ductal carcinoma, ER/PR positive, HER2 neu   . Fracture surgery  2010  . Angioplasty of right renal artery  05/04/1995    SOCIAL HISTORY: Social History   Social History  . Marital Status: Married    Spouse Name: N/A  . Number of Children: N/A  . Years of Education: N/A   Occupational History  . Not on file.   Social History Main Topics  . Smoking status: Former Smoker -- 0.25 packs/day for 20 years    Types: Cigarettes  . Smokeless tobacco: Former Systems developer    Quit date: 05/30/1976     Comment: early teens; early college smoking Quit: 05/30/1976  . Alcohol Use: 0.0 oz/week    0 Standard drinks or equivalent per week     Comment: occasional social drinker  . Drug Use: No  . Sexual Activity:    Partners: Male   Other Topics Concern  .  Not on file   Social History Narrative    FAMILY HISTORY: Family History  Problem Relation Age of Onset  . Breast cancer Mother   . Hypertension Mother   . Alcohol abuse Father   . Cirrhosis Father   . Hypertension Father   . Heart disease Brother   . Hypertension Daughter   . Skin cancer Daughter     ALLERGIES:  has No Known Allergies.  MEDICATIONS:  Current Outpatient Prescriptions  Medication Sig Dispense Refill  . amLODipine (NORVASC) 5 MG tablet Take 1 tablet by mouth daily.  3  . cyanocobalamin (,VITAMIN B-12,) 1000 MCG/ML injection Inject 1,000 mcg into the muscle every 30 (thirty)  days.    Marland Kitchen desonide (DESOWEN) 0.05 % cream Apply 1 application topically 2 (two) times daily.    Marland Kitchen donepezil (ARICEPT) 5 MG tablet Take 1 tablet by mouth daily.  0  . levothyroxine (SYNTHROID, LEVOTHROID) 100 MCG tablet Take 1 tablet by mouth daily.  2  . lisinopril (PRINIVIL,ZESTRIL) 40 MG tablet Take 1 tablet by mouth daily.  8  . fluticasone (FLONASE) 50 MCG/ACT nasal spray Place 2 sprays into both nostrils daily.  4   No current facility-administered medications for this visit.      PHYSICAL EXAMINATION: ECOG PERFORMANCE STATUS: 1 - Symptomatic but completely ambulatory  Filed Vitals:   10/31/14 1143  BP: 168/98  Pulse:   Temp:   Resp:    Filed Weights   10/31/14 1141  Weight: 127 lb 13.9 oz (58.001 kg)    GENERAL:alert, no distress and comfortable; accompanied by her husband.   LABORATORY DATA:    ASSESSMENT & PLAN:   # Isolated erythrocytosis/Secondary polycythemia [Jak-2 NEG] likely secondary erythrocytosis from ongoing obstructive sleep apnea. Question renal artery stenosis [prior history of fibromuscular dysplasia of the renal vessel]. Patient's blood pressure today is 168/98; last visit on 005 systolic.  # CT of the abdomen and pelvis- negative for any kidney masses or uterine tumors.  # Today patient's hematocrit is 44.5; I recommend holding off any phlebotomy today. The goal is to keep hematocrit less than 45.  # Given the elevated blood pressure- recommend follow up with PCP.   All questions were answered. The patient knows to call the clinic with any problems, questions or concerns. I spent 15 minutes counseling the patient face to face. The total time spent in the appointment was 30 minutes and more than 50% was on counseling.   Cammie Sickle, MD 10/31/2014 11:52 AM

## 2014-11-03 ENCOUNTER — Ambulatory Visit: Payer: Medicare Other

## 2014-11-05 ENCOUNTER — Ambulatory Visit: Payer: Medicare Other

## 2014-11-05 DIAGNOSIS — R2681 Unsteadiness on feet: Secondary | ICD-10-CM | POA: Diagnosis not present

## 2014-11-05 DIAGNOSIS — R531 Weakness: Secondary | ICD-10-CM

## 2014-11-06 NOTE — Therapy (Signed)
[place] MAIN [facility] SERVICES [address] [place], [place], [address] Phone: [phone]   Fax:  [phone]  Physical Therapy Treatment  Patient Details  Name: Laura Carpenter MRN: [id number] Date of Birth: Jun 26, 1935 Referring Provider:  Glendon Axe, MD  Encounter Date: 11/05/2014      PT End of Session - 11/06/14 0832    Visit Number 4   Number of Visits 9   Date for PT Re-Evaluation 11/18/14   Authorization Type 05-11-2022 G codes   PT Start Time 1300   PT Stop Time 1345   PT Time Calculation (min) 45 min   Equipment Utilized During Treatment Gait belt   Activity Tolerance Patient tolerated treatment well   Behavior During Therapy [facility] for tasks assessed/performed      Past Medical History  Diagnosis Date  . History of radiation therapy 2011  . Sleep apnea   . Hemorrhoid   . Hypothyroidism   . Dementia   . Dizziness   . Hypertension   . Fibromuscular dysplasia of renal artery ([place])     1997 Renal angioplasty at [facility]. She subsequently underwent repeat angioplasty a few years later. 12/2009 Abdominal Duplex: <60% bilateral RAS, Sequential narrowing and dilations of renal arteries, suggests fibromuscular dysplasia.   . Otalgia of right ear 11/10/2011  . Chronic hyponatremia     Hyponatremia for which she has had since somewhere in the mid 1990's. She's had multiple hospitalization, one hospitalization in 1993, she was diagnosed with an EF of 25% as well, though coronaries were clean during that hospitalization. The cath showed an EF of 50%. She is being worked up for her hyponatremia at [facility].  . H/O electrolyte imbalance   . Ingrowing nail, left great toe   . Polycythemia   . Allergic rhinitis   . Hearing loss   . Breast cancer ([place])     02/17/10: right lumpectomy reveals 0.8 cm of invasive ductal carcinoma with 1/3 LN+ for isolated tumor cell cluster in Oncotype recurrent score 19 or 12% chance of distant recurrence; Femara  self-discontinued secondary to side effects. She continues to decline further therapy  . Skin cancer     squamous cell skin cancer removed by Dermatology--arms and hands    Past Surgical History  Procedure Laterality Date  . Breast lumpectomy Right 2012    performed [facility]  . Core biopsy breast Right 2012    ultrasound guided core needle biopsy of the right breast reveals invasive ductal carcinoma, ER/PR positive, HER2 neu   . Fracture surgery  2010  . Angioplasty of right renal artery  1995-05-11    There were no vitals filed for this visit.  Visit Diagnosis:  Unsteadiness on feet  Weakness      Subjective Assessment - 11/06/14 0831    Subjective Pt relates she has been doing well and has tried to stay busy to avoid performing her HEP and walking.     Patient Stated Goals would like to walk normal and go and down steps   Currently in Pain? No/denies   Pain Score 0-No pain        There ex:  Sit to stand x10 Forward walking on red mat with objects underneath x6 laps Side stepping on red mat with objects underneath x6 laps Step forward and back on red mat x10 each LE Step forward and back on red mat x10 each LE with instruction to perform activity quickly  Side step and back on red ma  x10 each LE Side step and back on red mat x10 each LE with instruction to perform activity quickly Walk around the perimeter of the mat x2 Squats on AIREX x10 Pt standing on AIREX with ball toss x2 min Standing on AIREX with narrow base of support x1 min Side stepping on AIREX beam x 3laps in //bars Toe taps on 6 inch step while standing on AIREX x10 each LE Toe taps on 6 inch step while standing on AIREX X10 each LE  with instruction to perform activity quickly Marching in //bars x2 laps with instruction to maintain SLS as long as possible Pt demonstrates increased unsteadiness during right single leg stance compared to left Pt has tendency to look down when performing activities and  requires frequent cueing to up                           PT Education - 11/06/14 0831    Education provided Yes   Education Details plan of care and balance exercise progression    Person(s) Educated Patient   Methods Explanation   Comprehension Verbalized understanding             PT Long Term Goals - 10/21/14 1730    PT LONG TERM GOAL #1   Title pt will be able to ascend/descend 4 steps with step over step pattern and without UE assist   Baseline pt ascends steps with step to step pattern without UE assist   Time 4   Period Weeks   Status New   PT LONG TERM GOAL #2   Title pt's 5x sit to stand will be less than 10 seconds indicating improved LE functional strength    Baseline 16.26 sec without UE assist   Period Days   Status New   PT LONG TERM GOAL #3   Title pt's timed up and go time will be less than 10 seconds indicating decreased fall risk   Baseline 12.26 sec   Time 4   Period Weeks   Status New               Plan - 11/06/14 5176    Clinical Impression Statement Pt did really well today and experienced 2 LOB that required min A to recover, otherwise required close supervision-CGA.  pt has tendency to look down during activities and requires cueing to look up.  Pt is able to ambulate safely around the gym without any LOB.   Pt will benefit from skilled therapeutic intervention in order to improve on the following deficits Decreased strength;Decreased balance;Hypomobility;Decreased activity tolerance;Decreased range of motion   Rehab Potential Fair   Clinical Impairments Affecting Rehab Potential decreased right ankle ROM from previous surgery    PT Frequency 2x / week   PT Duration 4 weeks   PT Treatment/Interventions Cryotherapy;Moist Heat;Therapeutic exercise;Therapeutic activities;Functional mobility training;Stair training;Gait training;Balance training;Neuromuscular re-education;Patient/family education;Manual techniques   PT Next  Visit Plan progress HEP        Problem List Patient Active Problem List   Diagnosis Date Noted  . Polycythemia, secondary 10/17/2014   Renford Dills, SPT This entire session was performed under direct supervision and direction of a licensed therapist/therapist assistant . I have personally read, edited and approve of the note as written. Gorden Harms. [name], PT, DPT [phone]  [name],[name] 11/06/2014, 1:49 PM  [place] MAIN [facility] SERVICES [address] [place], [place], [address] Phone: [phone]   Fax:  [phone]      

## 2014-11-10 ENCOUNTER — Ambulatory Visit: Payer: Medicare Other

## 2014-11-10 DIAGNOSIS — R2681 Unsteadiness on feet: Secondary | ICD-10-CM | POA: Diagnosis not present

## 2014-11-10 DIAGNOSIS — R531 Weakness: Secondary | ICD-10-CM

## 2014-11-11 NOTE — Therapy (Addendum)
[place] [facility] REHAB SERVICES [address] [place], [place], [address] Phone: [phone]   Fax:  [phone]  Physical Therapy Treatment  Patient Details  Name: Laura Carpenter MRN: [id number] Date of Birth: 09/27/1935 Referring Provider: Jasmine Singh   Encounter Date: 11/10/2014      PT End of Session - 11/10/14 1305    Visit Number 5   Number of Visits 9   Date for PT Re-Evaluation 11/18/14   Authorization Type 5/10 G codes   PT Start Time 1300   PT Stop Time 1345   PT Time Calculation (min) 45 min   Equipment Utilized During Treatment Gait belt   Activity Tolerance Patient tolerated treatment well   Behavior During Therapy WFL for tasks assessed/performed      Past Medical History  Diagnosis Date  . History of radiation therapy 2011  . Sleep apnea   . Hemorrhoid   . Hypothyroidism   . Dementia   . Dizziness   . Hypertension   . Fibromuscular dysplasia of renal artery ([facility])     1997 Renal angioplasty at [facility]. She subsequently underwent repeat angioplasty a few years later. 12/2009 Abdominal Duplex: <60% bilateral RAS, Sequential narrowing and dilations of renal arteries, suggests fibromuscular dysplasia.   . Otalgia of right ear 11/10/2011  . Chronic hyponatremia     Hyponatremia for which she has had since somewhere in the mid 1990's. She's had multiple hospitalization, one hospitalization in 1993, she was diagnosed with an EF of 25% as well, though coronaries were clean during that hospitalization. The cath showed an EF of 50%. She is being worked up for her hyponatremia at [facility].  . H/O electrolyte imbalance   . Ingrowing nail, left great toe   . Polycythemia   . Allergic rhinitis   . Hearing loss   . Breast cancer ([facility])     02/17/10: right lumpectomy reveals 0.8 cm of invasive ductal carcinoma with 1/3 LN+ for isolated tumor cell cluster in Oncotype recurrent score 19 or 12% chance of distant recurrence; Femara  self-discontinued secondary to side effects. She continues to decline further therapy  . Skin cancer     squamous cell skin cancer removed by Dermatology--arms and hands    Past Surgical History  Procedure Laterality Date  . Breast lumpectomy Right 2012    performed [facility]  . Core biopsy breast Right 2012    ultrasound guided core needle biopsy of the right breast reveals invasive ductal carcinoma, ER/PR positive, HER2 neu   . Fracture surgery  2010  . Angioplasty of right renal artery  05/04/1995    There were no vitals filed for this visit.  Visit Diagnosis:  Unsteadiness on feet  Weakness      Subjective Assessment - 11/11/14 0832    Subjective Pt relates she is doing "pretty good."  Pt is "trying" to do her exercises at home at least once a day but prefers to read a book.     Patient Stated Goals would like to walk normal and go and down steps   Currently in Pain? No/denies      There ex  Nustep x 3 min level 3: no charge  Sit to stand x10  Bosu forward lunge 2x10, pt required verbal, visual and tactile cuing to bend her knee versus forward trunk lean  Heel raises x10  Squats on AIREX 2x10, pt required verbal cueing and mirror visual cueing to decrease knee valgus  Gait training  Pt   ambulated ~ 500 ft from the gym to outside garden area. Pt was able to ambulate safely on hard surface (sidewalk) with an incline. Pt's cadence and stride length decreases when ascending incline and required verbal to increase her stride. Pt was able to ambulate in a straight line while looking to the right or left with no noted unsteadiness or LOB. Pt was able to ambulate safely from sidewalk to grass (soft surface) without LOB. Pt's cadence and stride length decreases. Pt ambulated with single point cane on R. SPT cued for increased cadence and step length  Pt required ~4 min rest break during gait training  Pt requited CGA for safety and min cues for foot clearance and to attend to environment         [facility] PT Assessment - 11/11/14 0840    Assessment   Referring Provider Glendon Axe                              PT Education - 11/11/14 [phone]    Education provided Yes   Education Details to increase frequency of HEP performance, gait training and plan of care    Person(s) Educated Patient   Methods Explanation   Comprehension Verbalized understanding             PT Long Term Goals - 10/21/14 1730    PT LONG TERM GOAL #1   Title pt will be able to ascend/descend 4 steps with step over step pattern and without UE assist   Baseline pt ascends steps with step to step pattern without UE assist   Time 4   Period Weeks   Status New   PT LONG TERM GOAL #2   Title pt's 5x sit to stand will be less than 10 seconds indicating improved LE functional strength    Baseline 16.26 sec without UE assist   Period Days   Status New   PT LONG TERM GOAL #3   Title pt's timed up and go time will be less than 10 seconds indicating decreased fall risk   Baseline 12.26 sec   Time 4   Period Weeks   Status New               Plan - 11/11/14 9604    Clinical Impression Statement Pt did really well today and did not experience any LOB throughout session.  Pt demonstrates decreased functional activity tolerance by requiring rest break during gait training and decreases stride length and cadence as she fatigues.   pt would benefit from continued skilled PT services to improve functional activity tolerance and LE functional strength   Pt will benefit from skilled therapeutic intervention in order to improve on the following deficits Decreased strength;Decreased balance;Hypomobility;Decreased activity tolerance;Decreased range of motion   Rehab Potential Fair   Clinical Impairments Affecting Rehab Potential decreased right ankle ROM from previous surgery    PT Frequency 2x / week   PT Duration 4 weeks   PT Treatment/Interventions Cryotherapy;Moist  Heat;Therapeutic exercise;Therapeutic activities;Functional mobility training;Stair training;Gait training;Balance training;Neuromuscular re-education;Patient/family education;Manual techniques   PT Next Visit Plan progress HEP        Problem List Patient Active Problem List   Diagnosis Date Noted  . Polycythemia, secondary 10/17/2014   Renford Dills, SPT This entire session was performed under direct supervision and direction of a licensed therapist/therapist assistant . I have personally read, edited and approve of the note as written. Gorden Harms. [name],  PT, DPT [phone]  [name],[name] 11/11/2014, 9:51 AM  [place] MAIN [facility] SERVICES [address] [place], [place], [address] Phone: [phone]   Fax:  [phone]  Name: MARITSA HUNSUCKER MRN: [id number] Date of Birth: 1935/03/29

## 2014-11-12 ENCOUNTER — Ambulatory Visit: Payer: Medicare Other

## 2014-11-12 DIAGNOSIS — R2681 Unsteadiness on feet: Secondary | ICD-10-CM

## 2014-11-12 DIAGNOSIS — R531 Weakness: Secondary | ICD-10-CM

## 2014-11-13 NOTE — Patient Instructions (Signed)
[email or web address] Sit to stand 3x10

## 2014-11-13 NOTE — Therapy (Signed)
[place] MAIN [facility] SERVICES [address] [place], [place], [address] Phone: [phone]   Fax:  [phone]  Physical Therapy Treatment  Patient Details  Name: Laura Carpenter MRN: [id number] Date of Birth: 1935/02/19 Referring Provider: Glendon Axe   Encounter Date: 11/12/2014      PT End of Session - 11/12/14 1304    Visit Number 6   Number of Visits 9   Date for PT Re-Evaluation 11/18/14   Authorization Type 6/10 G codes   PT Start Time 1300   PT Stop Time 1345   PT Time Calculation (min) 45 min   Equipment Utilized During Treatment Gait belt   Activity Tolerance Patient tolerated treatment well   Behavior During Therapy [facility] for tasks assessed/performed      Past Medical History  Diagnosis Date  . History of radiation therapy 2011  . Sleep apnea   . Hemorrhoid   . Hypothyroidism   . Dementia   . Dizziness   . Hypertension   . Fibromuscular dysplasia of renal artery ([place])     1997 Renal angioplasty at [facility]. She subsequently underwent repeat angioplasty a few years later. 12/2009 Abdominal Duplex: <60% bilateral RAS, Sequential narrowing and dilations of renal arteries, suggests fibromuscular dysplasia.   . Otalgia of right ear 11/10/2011  . Chronic hyponatremia     Hyponatremia for which she has had since somewhere in the mid 1990's. She's had multiple hospitalization, one hospitalization in 1993, she was diagnosed with an EF of 25% as well, though coronaries were clean during that hospitalization. The cath showed an EF of 50%. She is being worked up for her hyponatremia at [employer].  . H/O electrolyte imbalance   . Ingrowing nail, left great toe   . Polycythemia   . Allergic rhinitis   . Hearing loss   . Breast cancer ([place])     02/17/10: right lumpectomy reveals 0.8 cm of invasive ductal carcinoma with 1/3 LN+ for isolated tumor cell cluster in Oncotype recurrent score 19 or 12% chance of distant recurrence; Femara  self-discontinued secondary to side effects. She continues to decline further therapy  . Skin cancer     squamous cell skin cancer removed by Dermatology--arms and hands    Past Surgical History  Procedure Laterality Date  . Breast lumpectomy Right 2012    performed [facility]  . Core biopsy breast Right 2012    ultrasound guided core needle biopsy of the right breast reveals invasive ductal carcinoma, ER/PR positive, HER2 neu   . Fracture surgery  2010  . Angioplasty of right renal artery  05/04/1995    There were no vitals filed for this visit.  Visit Diagnosis:  Unsteadiness on feet  Weakness      Subjective Assessment - 11/12/14 1304    Subjective Pt reports she is doing "pretty good" but prefers to sit and read a book.     Patient Stated Goals would like to walk normal and go and down steps   Currently in Pain? No/denies   Pain Score 0-No pain      There ex: Nustep level 5 x4 min: no charge Sit to stand x10 with red band above knees   Sit to stand x5 with red band above knees, pt fatigued after 5 reps Squats with red band above knees and yellow medicine ball x10 Bosu forward lunge 2x10, pt required verbal, visual and tactile cuing to bend her knee versus forward trunk lean   Heel raises x10  Bosu side lunge 2x10, pt required tactile cueing to shift weight to LE on Bosu Squats on AIREX 2x10 with red band above knees for tactile cueing to decrease knee valgus   Double leg press with 75#x10 pt required verbal cues to decrease speed for improved control Single leg press with 45# 2x10, pt required tactile cues to extend knee Side stepping with red band above knees x 2 laps 15 ft Pt required red band above knees to decrease knee valgus throughout session and verbal and visual cues for correct exercise technique Pt required 4 rest breaks each ~ 1-3 min and SBA for safety                            PT Education - 11/13/14 0922    Education provided Yes    Education Details plan of care and improving frequency of HEP to improve gains    Person(s) Educated Patient   Methods Explanation   Comprehension Verbalized understanding             PT Long Term Goals - 10/21/14 1730    PT LONG TERM GOAL #1   Title pt will be able to ascend/descend 4 steps with step over step pattern and without UE assist   Baseline pt ascends steps with step to step pattern without UE assist   Time 4   Period Weeks   Status New   PT LONG TERM GOAL #2   Title pt's 5x sit to stand will be less than 10 seconds indicating improved LE functional strength    Baseline 16.26 sec without UE assist   Period Days   Status New   PT LONG TERM GOAL #3   Title pt's timed up and go time will be less than 10 seconds indicating decreased fall risk   Baseline 12.26 sec   Time 4   Period Weeks   Status New               Plan - 11/13/14 0263    Clinical Impression Statement Pt did well with strength progression today and did not experience any pain or LOB throughout session.  Pt demonstrates increased knee valgus as she fatigues, especially after first sit of sit to stands and required mirror feedback and tactile cuing from red band to decrease knee valgus.  Pt was educated/instructed to increase frequency of HEP to improve LE strength and functional activity tolerance to improve functional mobility.     Pt will benefit from skilled therapeutic intervention in order to improve on the following deficits Decreased strength;Decreased balance;Hypomobility;Decreased activity tolerance;Decreased range of motion   Rehab Potential Fair   Clinical Impairments Affecting Rehab Potential decreased right ankle ROM from previous surgery    PT Frequency 2x / week   PT Duration 4 weeks   PT Treatment/Interventions Cryotherapy;Moist Heat;Therapeutic exercise;Therapeutic activities;Functional mobility training;Stair training;Gait training;Balance training;Neuromuscular  re-education;Patient/family education;Manual techniques   PT Next Visit Plan progress HEP        Problem List Patient Active Problem List   Diagnosis Date Noted  . Polycythemia, secondary 10/17/2014   Renford Dills, SPT This entire session was performed under direct supervision and direction of a licensed therapist/therapist assistant . I have personally read, edited and approve of the note as written. Gorden Harms. [name], PT, DPT [phone]  [name],[name] 11/13/2014, 11:58 AM  [place] MAIN [facility] SERVICES [address] [place], [place], [address] Phone: [phone]  Fax:  [phone]  Name: Laura Carpenter MRN: [id number] Date of Birth: November 06, 1935

## 2014-11-24 ENCOUNTER — Other Ambulatory Visit: Payer: Self-pay | Admitting: Neurology

## 2014-11-24 DIAGNOSIS — G301 Alzheimer's disease with late onset: Principal | ICD-10-CM

## 2014-11-24 DIAGNOSIS — F0280 Dementia in other diseases classified elsewhere without behavioral disturbance: Secondary | ICD-10-CM

## 2014-11-28 ENCOUNTER — Ambulatory Visit: Payer: Medicare Other

## 2014-12-01 ENCOUNTER — Ambulatory Visit: Payer: Medicare Other | Attending: Otolaryngology

## 2014-12-01 DIAGNOSIS — G4733 Obstructive sleep apnea (adult) (pediatric): Secondary | ICD-10-CM | POA: Insufficient documentation

## 2014-12-05 ENCOUNTER — Ambulatory Visit
Admission: RE | Admit: 2014-12-05 | Discharge: 2014-12-05 | Disposition: A | Discharge status: Home / Self Care | Payer: Medicare Other | Source: Ambulatory Visit | Attending: Neurology | Admitting: Neurology

## 2014-12-05 DIAGNOSIS — I679 Cerebrovascular disease, unspecified: Secondary | ICD-10-CM | POA: Diagnosis not present

## 2014-12-05 DIAGNOSIS — F0280 Dementia in other diseases classified elsewhere without behavioral disturbance: Secondary | ICD-10-CM | POA: Diagnosis present

## 2014-12-05 DIAGNOSIS — G301 Alzheimer's disease with late onset: Secondary | ICD-10-CM | POA: Insufficient documentation

## 2015-02-04 ENCOUNTER — Ambulatory Visit: Payer: Medicare Other | Attending: Neurology

## 2015-02-04 DIAGNOSIS — R2681 Unsteadiness on feet: Secondary | ICD-10-CM | POA: Diagnosis present

## 2015-02-04 DIAGNOSIS — R531 Weakness: Secondary | ICD-10-CM | POA: Insufficient documentation

## 2015-02-04 NOTE — Therapy (Signed)
[place] MAIN [facility] SERVICES [address] [place], [place], [address] Phone: [phone]   Fax:  [phone]  Physical Therapy Evaluation  Patient Details  Name: Laura Carpenter MRN: [id number] Date of Birth: 09/21/1935 Referring Provider: Lemmie Evens shah  Encounter Date: 02/04/2015      PT End of Session - 02/04/15 1405    Visit Number 1   Number of Visits 9   Date for PT Re-Evaluation 03/04/15   Authorization Type 1/10   PT Start Time 9150   PT Stop Time 1345   PT Time Calculation (min) 40 min   Equipment Utilized During Treatment Gait belt   Activity Tolerance Patient tolerated treatment well   Behavior During Therapy [facility] for tasks assessed/performed      Past Medical History  Diagnosis Date  . History of radiation therapy 2011  . Sleep apnea   . Hemorrhoid   . Hypothyroidism   . Dementia   . Dizziness   . Hypertension   . Fibromuscular dysplasia of renal artery ([place])     1997 Renal angioplasty at [facility]. She subsequently underwent repeat angioplasty a few years later. 12/2009 Abdominal Duplex: <60% bilateral RAS, Sequential narrowing and dilations of renal arteries, suggests fibromuscular dysplasia.   . Otalgia of right ear 11/10/2011  . Chronic hyponatremia     Hyponatremia for which she has had since somewhere in the mid 1990's. She's had multiple hospitalization, one hospitalization in 1993, she was diagnosed with an EF of 25% as well, though coronaries were clean during that hospitalization. The cath showed an EF of 50%. She is being worked up for her hyponatremia at [employer] [facility].  . H/O electrolyte imbalance   . Ingrowing nail, left great toe   . Polycythemia   . Allergic rhinitis   . Hearing loss   . Breast cancer ([place])     02/17/10: right lumpectomy reveals 0.8 cm of invasive ductal carcinoma with 1/3 LN+ for isolated tumor cell cluster in Oncotype recurrent score 19 or 12% chance of distant recurrence; Femara self-discontinued secondary to  side effects. She continues to decline further therapy  . Skin cancer     squamous cell skin cancer removed by Dermatology--arms and hands    Past Surgical History  Procedure Laterality Date  . Breast lumpectomy Right 2012    performed [facility]  . Core biopsy breast Right 2012    ultrasound guided core needle biopsy of the right breast reveals invasive ductal carcinoma, ER/PR positive, HER2 neu   . Fracture surgery  2010  . Angioplasty of right renal artery  05/04/1995    There were no vitals filed for this visit.  Visit Diagnosis:  Unsteadiness on feet - Plan: PT plan of care cert/re-cert  Weakness - Plan: PT plan of care cert/re-cert      Subjective Assessment - 02/04/15 1304    Subjective pt reports she quit coming to therapy because she thought she was finished. pt reports her balance has not gotten better or worse since she stopped,but she did not continue with her exercises. pt reports reduced sensation on the bottom of her feet.    Patient Stated Goals would like to walk normal and go and down steps   Currently in Pain? No/denies            [facility] PT Assessment - 02/04/15 0001    Assessment   Medical Diagnosis imbalance    Referring Provider H shah   Onset Date/Surgical Date 02/03/14   Prior  Therapy PT    Precautions   Precautions Fall   Balance Screen   Has the patient fallen in the past 6 months No   Has the patient had a decrease in activity level because of a fear of falling?  Yes   Is the patient reluctant to leave their home because of a fear of falling?  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         POSTURE/OBSERVATION: WFL  PROM/AROM: R ankle ROM limited:  Dorsiflexion 4deg Inversion 8deg Eversion 6 deg   STRENGTH:  Graded on a 0-5 scale Muscle Group Left Right                          Hip Flex 4 4  Hip Abd 4 3+  Hip Add 3- 3-  Hip Ext 3+ 4  Hip IR/ER    Knee Flex 4+ 3+  Knee Ext 5 4+  Ankle DF 3+ 4+  Ankle PF     SENSATION: Reduced plantar surface of foot  SPECIAL TESTS:   FUNCTIONAL MOBILITY:   BALANCE:   GAIT:  pt walks with short step length R>L  R foot turned out likely due to reduced dorsiflexion, wide BOS OUTCOME MEASURES: TEST Outcome Interpretation  5 times sit<>stand sec >78 yo, >15 sec indicates increased risk for falls  10 meter walk test                 m/s <1.0 m/s indicates increased risk for falls; limited community ambulator  Timed up and Go                 sec <14 sec indicates increased risk for falls  6 minute walk test                Feet 1000 feet is community Conservator, museum/gallery Assessment 50/56 <36/56 (100% risk for falls), 37-45 (80% risk for falls); 46-51 (>50% risk for falls); 52-55 (lower risk <25% of falls)                        PT Education - 02/04/15 1404    Education provided Yes   Education Details plan of care. eval findings    Person(s) Educated Patient   Methods Explanation   Comprehension Verbalized understanding             PT Long Term Goals - 02/04/15 1409    PT LONG TERM GOAL #1   Title pt will be able to ascend/descend 4 steps with step over step pattern and without UE assist   Time 4   Period Weeks   Status New   PT LONG TERM GOAL #2   Title pt's 5x sit to stand will be less than 10 seconds indicating improved LE functional strength    Period Days   Status New   PT LONG TERM GOAL #3   Title pt's timed  up and go time will be less than 10 seconds indicating decreased fall risk    Time 4   Period Weeks   Status New   PT LONG TERM GOAL #4   Title pt will score >52/56 on berg balance test as a low fall risk   Time 4   Period Weeks   Status New   PT LONG TERM GOAL #5   Title pt will score >19/24 on DGI signifying a low fall risk    Time 4   Period Weeks   Status New               Plan - 2015/02/14 1405    Clinical Impression Statement pt presents following 3 month break from PT. she does not demonstrate significant loss of balance from that time, scoring 50/56 on berg balance test placing her as a moderate fall risk. pt also demonstrates impaired gait, and LE strength, particularly in the hips. She has reduced ankle ROM, tightness, pain and reduced strength likely from a fracture of the ankle 20 years ago. pt would beneifit from skilled PT services to address stated deficits to reduce fall risk and maximize function.    Pt will benefit from skilled therapeutic intervention in order to improve on the following deficits Decreased strength;Decreased balance;Decreased activity tolerance;Decreased range of motion;Difficulty walking;Impaired flexibility   Rehab Potential Fair   Clinical Impairments Affecting Rehab Potential decreased right ankle ROM from previous surgery    PT Frequency 2x / week   PT Duration 4 weeks   PT Treatment/Interventions Cryotherapy;Moist Heat;Therapeutic exercise;Therapeutic activities;Functional mobility training;Stair training;Gait training;Balance training;Neuromuscular re-education;Patient/family education;Manual techniques   PT Next Visit Plan review HEP, DGI, 40mn walk, 5x sit to stand, gait speed           G-Codes - 0Jan 21, 20171408    Functional Assessment Tool Used history, clinical judgment, outcome measures    Functional Limitation Mobility: Walking and moving around   Mobility: Walking and Moving Around Current Status (([id number] At least 20 percent but less than 40 percent impaired, limited or restricted   Mobility: Walking and  Moving Around Goal Status [phone] At least 1 percent but less than 20 percent impaired, limited or restricted       Problem List Patient Active Problem List   Diagnosis Date Noted  . Polycythemia, secondary 10/17/2014   AGorden Harms Jamiee Milholland, PT, DPT #[phone]  Karlye Ihrig 12017/01/21 2:11 PM  [place] [facility] [address] [place] [address]Phone: [phone]  Fax:  [phone] Name: LCURTINA GRILLSMRN: [id number] of Birth: 5Oct 21, 1937

## 2015-02-06 ENCOUNTER — Inpatient Hospital Stay (HOSPITAL_BASED_OUTPATIENT_CLINIC_OR_DEPARTMENT_OTHER): Payer: Medicare Other | Admitting: Internal Medicine

## 2015-02-06 ENCOUNTER — Inpatient Hospital Stay: Payer: Medicare Other | Attending: Internal Medicine

## 2015-02-06 ENCOUNTER — Encounter: Payer: Self-pay | Admitting: Internal Medicine

## 2015-02-06 ENCOUNTER — Inpatient Hospital Stay: Payer: Medicare Other

## 2015-02-06 VITALS — BP 129/88 | HR 78 | Temp 98.1°F | Resp 18 | Ht 66.5 in | Wt 119.7 lb

## 2015-02-06 DIAGNOSIS — E039 Hypothyroidism, unspecified: Secondary | ICD-10-CM | POA: Insufficient documentation

## 2015-02-06 DIAGNOSIS — Z803 Family history of malignant neoplasm of breast: Secondary | ICD-10-CM | POA: Diagnosis not present

## 2015-02-06 DIAGNOSIS — I10 Essential (primary) hypertension: Secondary | ICD-10-CM

## 2015-02-06 DIAGNOSIS — Z9223 Personal history of estrogen therapy: Secondary | ICD-10-CM

## 2015-02-06 DIAGNOSIS — F0390 Unspecified dementia without behavioral disturbance: Secondary | ICD-10-CM

## 2015-02-06 DIAGNOSIS — G4733 Obstructive sleep apnea (adult) (pediatric): Secondary | ICD-10-CM | POA: Insufficient documentation

## 2015-02-06 DIAGNOSIS — Z923 Personal history of irradiation: Secondary | ICD-10-CM | POA: Diagnosis not present

## 2015-02-06 DIAGNOSIS — E871 Hypo-osmolality and hyponatremia: Secondary | ICD-10-CM

## 2015-02-06 DIAGNOSIS — R197 Diarrhea, unspecified: Secondary | ICD-10-CM

## 2015-02-06 DIAGNOSIS — D751 Secondary polycythemia: Secondary | ICD-10-CM | POA: Diagnosis present

## 2015-02-06 DIAGNOSIS — Z7982 Long term (current) use of aspirin: Secondary | ICD-10-CM | POA: Insufficient documentation

## 2015-02-06 DIAGNOSIS — Z853 Personal history of malignant neoplasm of breast: Secondary | ICD-10-CM

## 2015-02-06 DIAGNOSIS — Z79899 Other long term (current) drug therapy: Secondary | ICD-10-CM | POA: Insufficient documentation

## 2015-02-06 DIAGNOSIS — Z87891 Personal history of nicotine dependence: Secondary | ICD-10-CM | POA: Insufficient documentation

## 2015-02-06 DIAGNOSIS — Z85828 Personal history of other malignant neoplasm of skin: Secondary | ICD-10-CM | POA: Diagnosis not present

## 2015-02-06 LAB — HEMATOCRIT: HEMATOCRIT: 45.8 % (ref 35.0–47.0)

## 2015-02-06 LAB — HEMOGLOBIN: HEMOGLOBIN: 15.5 g/dL (ref 12.0–16.0)

## 2015-02-06 NOTE — Progress Notes (Signed)
1115 pt here for phlebo stuck times two both lines clotted off, pt prefers to come back Monday, encouraged to drink a lot of water prior to appt

## 2015-02-06 NOTE — Progress Notes (Signed)
.[place] CONSULT NOTE  PCP: Dr.Jasmine Candiss Norse MD  CHIEF COMPLAINTS/PURPOSE OF CONSULTATION: Polycythemia  ONCOLOGIC HISTORY:  # 2016- ISOLATED ERYTHROCYTOSIS likely SECONDARY [ ? OSA vs RAS; Jak-2 NEG] if Hct > 45  # 2012- STAGE I RIGHT BREAST CA [[facility]] ER/PR-POS; Her-2 NEG s/p LUMEPC- RT;ONCTYPE- RS 19; NO CHEMO; INTOL to AI.   # OSA on CPAP; Hx Fibromuscular dysplasia [1997 s/p [facility]]  HISTORY OF PRESENTING ILLNESS:   Laura Carpenter 80 y.o. female   Patient with history of dementia/  Also isolated  Erythrocytosis likely secondary is here for follow-up.  As per the husband patient had long-standing diarrhea which has improved since stopping the Aricept.  She also had a new  Sleep study/  She seems to be tolerating it better.  No headaches or vision changes or double vision. No nausea no vomiting.    REVIEW OF SYSTEMS:   A complete 10 point review of system is done is negative for mentioned above in history of present illness   MEDICAL HISTORY:  Past Medical History  Diagnosis Date  . History of radiation therapy 2011  . Sleep apnea   . Hemorrhoid   . Hypothyroidism   . Dementia   . Dizziness   . Hypertension   . Fibromuscular dysplasia of renal artery ([place])     1997 Renal angioplasty at [facility]. She subsequently underwent repeat angioplasty a few years later. 12/2009 Abdominal Duplex: <60% bilateral RAS, Sequential narrowing and dilations of renal arteries, suggests fibromuscular dysplasia.   . Otalgia of right ear 11/10/2011  . Chronic hyponatremia     Hyponatremia for which she has had since somewhere in the mid 1990's. She's had multiple hospitalization, one hospitalization in 1993, she was diagnosed with an EF of 25% as well, though coronaries were clean during that hospitalization. The cath showed an EF of 50%. She is being worked up for her hyponatremia at [employer].  . H/O electrolyte imbalance   . Ingrowing nail, left great toe   .  Polycythemia   . Allergic rhinitis   . Hearing loss   . Breast cancer ([place])     02/17/10: right lumpectomy reveals 0.8 cm of invasive ductal carcinoma with 1/3 LN+ for isolated tumor cell cluster in Oncotype recurrent score 19 or 12% chance of distant recurrence; Femara self-discontinued secondary to side effects. She continues to decline further therapy  . Skin cancer     squamous cell skin cancer removed by Dermatology--arms and hands    SURGICAL HISTORY: Past Surgical History  Procedure Laterality Date  . Breast lumpectomy Right 2012    performed [facility]  . Core biopsy breast Right 2012    ultrasound guided core needle biopsy of the right breast reveals invasive ductal carcinoma, ER/PR positive, HER2 neu   . Fracture surgery  2010  . Angioplasty of right renal artery  05/04/1995    SOCIAL HISTORY: Social History   Social History  . Marital Status: Married    Spouse Name: N/A  . Number of Children: N/A  . Years of Education: N/A   Occupational History  . Not on file.   Social History Main Topics  . Smoking status: Former Smoker -- 0.25 packs/day for 20 years    Types: Cigarettes  . Smokeless tobacco: Former Systems developer    Quit date: 05/30/1976     Comment: early teens; early college smoking Quit: 05/30/1976  . Alcohol Use: 0.0 oz/week    0 Standard drinks or equivalent per  week     Comment: occasional social drinker  . Drug Use: No  . Sexual Activity:    Partners: Male   Other Topics Concern  . Not on file   Social History Narrative    FAMILY HISTORY: Family History  Problem Relation Age of Onset  . Breast cancer Mother   . Hypertension Mother   . Alcohol abuse Father   . Cirrhosis Father   . Hypertension Father   . Heart disease Brother   . Hypertension Daughter   . Skin cancer Daughter     ALLERGIES:  has No Known Allergies.  MEDICATIONS:  Current Outpatient Prescriptions  Medication Sig Dispense Refill  . amLODipine (NORVASC) 5 MG tablet Take 1 tablet by  mouth daily.  3  . aspirin 81 MG tablet Take 81 mg by mouth daily.    . cyanocobalamin (,VITAMIN B-12,) 1000 MCG/ML injection Inject 1,000 mcg into the muscle every 30 (thirty) days.    Marland Kitchen desonide (DESOWEN) 0.05 % cream Apply 1 application topically 2 (two) times daily.    Marland Kitchen donepezil (ARICEPT) 5 MG tablet Take 1 tablet by mouth daily.  0  . fluticasone (FLONASE) 50 MCG/ACT nasal spray Place 2 sprays into both nostrils daily.  4  . levothyroxine (SYNTHROID, LEVOTHROID) 100 MCG tablet Take 1 tablet by mouth daily.  2  . lisinopril (PRINIVIL,ZESTRIL) 40 MG tablet Take 1 tablet by mouth daily.  8  . sertraline (ZOLOFT) 50 MG tablet Take 1 tablet by mouth daily.     No current facility-administered medications for this visit.      PHYSICAL EXAMINATION: ECOG PERFORMANCE STATUS: 1 - Symptomatic but completely ambulatory   GENERAL:alert, no distress and comfortable; accompanied by her husband. SKIN: skin color, texture, turgor are normal, no rashes or significant lesions EYES: normal, conjunctiva are pink and non-injected, sclera clear OROPHARYNX:no exudate, no erythema and lips, buccal mucosa, and tongue normal  NECK: supple, thyroid normal size, non-tender, without nodularity LYMPH: no palpable lymphadenopathy in the cervical, axillary or inguinal LUNGS: clear to auscultation and percussion with normal breathing effort HEART: regular rate & rhythm and no murmurs and no lower extremity edema ABDOMEN:abdomen soft, non-tender and normal bowel sounds Musculoskeletal:no cyanosis of digits and no clubbing  PSYCH: alert & oriented x 3 with fluent speech NEURO: no focal motor/sensory deficits  Filed Vitals:   02/06/15 0951  BP: 129/88  Pulse: 78  Temp: 98.1 F (36.7 C)  Resp: 18   Filed Weights   02/06/15 0939 02/06/15 0951  Weight: 119 lb 11.4 oz (54.3 kg) 119 lb 11.4 oz (54.3 kg)    G   LABORATORY DATA:    ASSESSMENT & PLAN:   # Isolated erythrocytosis/Secondary  polycythemia [Jak-2 NEG] 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 Patient recently had a new CPAP machine.  #  Today hematocrit is 45.8;  Recommend phlebotomy of 250 mL.  Patient will likely need phlebotomy every 4 months.  #  Patient will follow-up because in approximately 4 months with CBC/ possible phlebotomy.  #  Diarrhea-  Question related to Aricept.  Improved since discontinuation.  Defer to PCP.  I spent 15 minutes counseling the patient face to face. The total time spent in the appointment was 30 minutes and more than 50% was on counseling.   Cammie Sickle, MD 02/06/2015 10:02 AM

## 2015-02-06 NOTE — Progress Notes (Signed)
Patient states she has been having diarrhea over the last month 2-3 stools a day. Husband states they stopped the Aricept on 01/27/15 and diarrhea has been less. Patient reports 1 diarrhea stool this am.

## 2015-02-09 ENCOUNTER — Ambulatory Visit: Payer: Medicare Other

## 2015-02-09 ENCOUNTER — Inpatient Hospital Stay: Payer: Medicare Other

## 2015-02-09 DIAGNOSIS — D751 Secondary polycythemia: Secondary | ICD-10-CM

## 2015-02-09 DIAGNOSIS — R531 Weakness: Secondary | ICD-10-CM

## 2015-02-09 DIAGNOSIS — R2681 Unsteadiness on feet: Secondary | ICD-10-CM | POA: Diagnosis not present

## 2015-02-09 NOTE — Patient Instructions (Addendum)
   All exercises provided were adapted from hep2go.com. Patient was provided a written handout with pictures as described. Any additional cues were manually entered in to handout and copied in to this document.   Standing marching in place-Supported  Stand facing your kitchen countertop. Place 1 or 2 hands on the counter for support. Begin marching in place, bringing your left knee up then down, followed by your right knee up and down. Continue alternating legs in this manner for the duration of the exercise.  TANDEM STANCE WITH SUPPORT  Stand in front of a chair, table or counter top for support. Then place the heel of one foot so that it is touching the toes of the other foot. Maintain your balance in this position.    Mini Squat at Counter  Standing with feet shoulder length apart, bend knees slightly and return to standing position. Don't let knees go over toes and keep hands lightly on counter top for support.

## 2015-02-10 NOTE — Therapy (Signed)
[place] MAIN [facility] SERVICES [address] [place], [place], [address] Phone: [phone]   Fax:  [phone]  Physical Therapy Treatment  Patient Details  Name: Laura Carpenter MRN: [id number] Date of Birth: [birth year] Referring Provider: Lemmie Evens shah  Encounter Date: 02/09/2015      PT End of Session - 02/09/15 1246    Visit Number 2   Number of Visits 9   Date for PT Re-Evaluation 03/04/15   Authorization Type 2/10   PT Start Time 1120   PT Stop Time 1201   PT Time Calculation (min) 41 min   Equipment Utilized During Treatment Gait belt   Activity Tolerance Patient tolerated treatment well   Behavior During Therapy [facility] for tasks assessed/performed      Past Medical History  Diagnosis Date  . History of radiation therapy 2011  . Sleep apnea   . Hemorrhoid   . Hypothyroidism   . Dementia   . Dizziness   . Hypertension   . Fibromuscular dysplasia of renal artery ([place])     1997 Renal angioplasty at [facility]. She subsequently underwent repeat angioplasty a few years later. 12/2009 Abdominal Duplex: <60% bilateral RAS, Sequential narrowing and dilations of renal arteries, suggests fibromuscular dysplasia.   . Otalgia of right ear 11/10/2011  . Chronic hyponatremia     Hyponatremia for which she has had since somewhere in the mid 1990's. She's had multiple hospitalization, one hospitalization in 1993, she was diagnosed with an EF of 25% as well, though coronaries were clean during that hospitalization. The cath showed an EF of 50%. She is being worked up for her hyponatremia at [employer].  . H/O electrolyte imbalance   . Ingrowing nail, left great toe   . Polycythemia   . Allergic rhinitis   . Hearing loss   . Breast cancer ([place])     02/17/10: right lumpectomy reveals 0.8 cm of invasive ductal carcinoma with 1/3 LN+ for isolated tumor cell cluster in Oncotype recurrent score 19 or 12% chance of distant recurrence; Femara self-discontinued secondary to  side effects. She continues to decline further therapy  . Skin cancer     squamous cell skin cancer removed by Dermatology--arms and hands    Past Surgical History  Procedure Laterality Date  . Breast lumpectomy Right 2012    performed [facility]  . Core biopsy breast Right 2012    ultrasound guided core needle biopsy of the right breast reveals invasive ductal carcinoma, ER/PR positive, HER2 neu   . Fracture surgery  2010  . Angioplasty of right renal artery  05/04/1995    There were no vitals filed for this visit.  Visit Diagnosis:  Unsteadiness on feet  Weakness      Subjective Assessment - 02/09/15 1241    Subjective Patient reports she is anxious about flying, but is looking forward to the cruise. It appears she has intermittently completed her HEP.    Patient Stated Goals would like to walk normal and go and down steps   Currently in Pain? No/denies        6MW test - 1180'   40mwalk time - 10.25 seconds   5x sit to stand - 14.41 seconds (without use of hands)  FGA - 18/30  NM Re-Education  Standing marching with HHA progression x 10 bilaterally with cuing for appropriate amount of assistance from UEs.  Tandem stance with counter top for support 3 bouts x 5-10" holds bilaterally  Standing mini-squats x 10 with  cuing for hip hinging and upright posture                           PT Education - 02/09/15 1244    Education provided Yes   Education Details Progressed HEP and instructed patient in findings of balance measures.    Person(s) Educated Patient   Methods Explanation;Demonstration;Handout   Comprehension Verbalized understanding;Returned demonstration             PT Long Term Goals - 02/04/15 1409    PT LONG TERM GOAL #1   Title pt will be able to ascend/descend 4 steps with step over step pattern and without UE assist   Time 4   Period Weeks   Status New   PT LONG TERM GOAL #2   Title pt's 5x sit to stand will be less than 10  seconds indicating improved LE functional strength    Period Days   Status New   PT LONG TERM GOAL #3   Title pt's timed up and go time will be less than 10 seconds indicating decreased fall risk   Time 4   Period Weeks   Status New   PT LONG TERM GOAL #4   Title pt will score >52/56 on berg balance test as a low fall risk   Time 4   Period Weeks   Status New   PT LONG TERM GOAL #5   Title pt will score >19/24 on DGI signifying a low fall risk    Time 4   Period Weeks   Status New               Plan - 02/09/15 1246    Clinical Impression Statement Per previous note patient demonstrates adequate static balance, however she demonstrates significant deficits in dynamic balance (FGA 18/30) indicating she is at high risk for falling in community distance mobility. Patient provided HEP targeting her deficits identified in testing today (narrow BOS, LE strength deficit).    Pt will benefit from skilled therapeutic intervention in order to improve on the following deficits Decreased strength;Decreased balance;Decreased activity tolerance;Decreased range of motion;Difficulty walking;Impaired flexibility   Rehab Potential Fair   Clinical Impairments Affecting Rehab Potential decreased right ankle ROM from previous surgery    PT Frequency 2x / week   PT Duration 4 weeks   PT Treatment/Interventions Cryotherapy;Moist Heat;Therapeutic exercise;Therapeutic activities;Functional mobility training;Stair training;Gait training;Balance training;Neuromuscular re-education;Patient/family education;Manual techniques   PT Next Visit Plan Progress narrow BOS, LE strengthening.    Consulted and Agree with Plan of Care Patient        Problem List Patient Active Problem List   Diagnosis Date Noted  . Polycythemia, secondary 10/17/2014    Kerman Passey, PT, DPT    02/10/2015, 8:24 AM  [place] MAIN [facility] SERVICES [address] [place],  [place], [address] Phone: [phone]   Fax:  [phone]  Name: Laura Carpenter MRN: [id number] Date of Birth: [birth year]

## 2015-02-11 ENCOUNTER — Ambulatory Visit: Payer: Medicare Other

## 2015-02-11 DIAGNOSIS — R2681 Unsteadiness on feet: Secondary | ICD-10-CM | POA: Diagnosis not present

## 2015-02-11 DIAGNOSIS — R531 Weakness: Secondary | ICD-10-CM

## 2015-02-11 NOTE — Therapy (Signed)
[place] MAIN [facility] SERVICES [address] [place], [place], [address] Phone: [phone]   Fax:  [phone]  Physical Therapy Treatment  Patient Details  Name: Laura Carpenter MRN: [id number] Date of Birth: 1935-12-07 Referring Provider: Lemmie Evens shah  Encounter Date: 02/11/2015      PT End of Session - 02/11/15 1450    Visit Number 3   Number of Visits 9   Date for PT Re-Evaluation 03/04/15   Authorization Type 3/10   PT Start Time 6440   PT Stop Time 1350   PT Time Calculation (min) 45 min   Equipment Utilized During Treatment Gait belt   Activity Tolerance Patient tolerated treatment well   Behavior During Therapy [employer] for tasks assessed/performed      Past Medical History  Diagnosis Date  . History of radiation therapy 2011  . Sleep apnea   . Hemorrhoid   . Hypothyroidism   . Dementia   . Dizziness   . Hypertension   . Fibromuscular dysplasia of renal artery ([place])     1997 Renal angioplasty at [facility]. She subsequently underwent repeat angioplasty a few years later. 12/2009 Abdominal Duplex: <60% bilateral RAS, Sequential narrowing and dilations of renal arteries, suggests fibromuscular dysplasia.   . Otalgia of right ear 11/10/2011  . Chronic hyponatremia     Hyponatremia for which she has had since somewhere in the mid 1990's. She's had multiple hospitalization, one hospitalization in 1993, she was diagnosed with an EF of 25% as well, though coronaries were clean during that hospitalization. The cath showed an EF of 50%. She is being worked up for her hyponatremia at [employer].  . H/O electrolyte imbalance   . Ingrowing nail, left great toe   . Polycythemia   . Allergic rhinitis   . Hearing loss   . Breast cancer ([place])     02/17/10: right lumpectomy reveals 0.8 cm of invasive ductal carcinoma with 1/3 LN+ for isolated tumor cell cluster in Oncotype recurrent score 19 or 12% chance of distant recurrence; Femara self-discontinued secondary to  side effects. She continues to decline further therapy  . Skin cancer     squamous cell skin cancer removed by Dermatology--arms and hands    Past Surgical History  Procedure Laterality Date  . Breast lumpectomy Right 2012    performed [facility]  . Core biopsy breast Right 2012    ultrasound guided core needle biopsy of the right breast reveals invasive ductal carcinoma, ER/PR positive, HER2 neu   . Fracture surgery  2010  . Angioplasty of right renal artery  05/04/1995    There were no vitals filed for this visit.  Visit Diagnosis:  Unsteadiness on feet  Weakness      Subjective Assessment - 02/11/15 1440    Subjective pt reports mild soreness after last session. pt reports trying to lift foot higher to prevent catching her foot on the ground. pt reports continuing her HEP.   Patient is accompained by: Family member   Patient Stated Goals would like to walk normal and go and down steps   Currently in Pain? No/denies   Pain Score 0-No pain           Gait training:  TM ambulation x 4 min 1.6-1.25mh cues for increased step length and heel/toe transfer. Pt used BUE  100 ft laps in hallway x 6 Pt requires max cues to increase step length improve heel/toe transfer and look forward SBA for safety Neuromuscular re ed: 20  ft lap ambulation with R and L head turns x 6 Step ups from airex to 6 in box and down 10 reps x 2 sets Forward and retro tandem walk in // bars x 4 laps Agility ladder 2 step forward and 1 step retro ambulation x 4 laps Agility ladder 2 step forward and 1 step retro and side step ambulation x 4 laps  Pt requires min to mod cues in correct pattern and foot placement with CGA-minA           PT Long Term Goals - 02/04/15 1409    PT LONG TERM GOAL #1   Title pt will be able to ascend/descend 4 steps with step over step pattern and without UE assist   Time 4   Period Weeks   Status New   PT LONG TERM GOAL #2   Title pt's 5x sit to stand will be less  than 10 seconds indicating improved LE functional strength    Period Days   Status New   PT LONG TERM GOAL #3   Title pt's timed up and go time will be less than 10 seconds indicating decreased fall risk   Time 4   Period Weeks   Status New   PT LONG TERM GOAL #4   Title pt will score >52/56 on berg balance test as a low fall risk   Time 4   Period Weeks   Status New   PT LONG TERM GOAL #5   Title pt will score >19/24 on DGI signifying a low fall risk    Time 4   Period Weeks   Status New               Plan - 02/11/15 1451    Clinical Impression Statement pt demonstrates improved gait patterns with cues and continues HEP. pt demonstrates deficits in dynamic balance activities and fatigue by the end of session. pt will continue to benefit from physical therapy.   Pt will benefit from skilled therapeutic intervention in order to improve on the following deficits Decreased strength;Decreased balance;Decreased activity tolerance;Decreased range of motion;Difficulty walking;Impaired flexibility   Rehab Potential Fair   Clinical Impairments Affecting Rehab Potential decreased right ankle ROM from previous surgery    PT Frequency 2x / week   PT Duration 4 weeks   PT Treatment/Interventions Cryotherapy;Moist Heat;Therapeutic exercise;Therapeutic activities;Functional mobility training;Stair training;Gait training;Balance training;Neuromuscular re-education;Patient/family education;Manual techniques   PT Next Visit Plan Progress narrow BOS, LE strengthening.    Consulted and Agree with Plan of Care Patient        Problem List Patient Active Problem List   Diagnosis Date Noted  . Polycythemia, secondary 10/17/2014    [name],[name] 02/11/2015, 3:15 PM  [place] MAIN [facility] SERVICES [address] [place], [place], [address] Phone: [phone]   Fax:  [phone]  Name: Laura Carpenter MRN: [id number] Date of Birth:  [birth year]

## 2015-02-16 ENCOUNTER — Ambulatory Visit: Payer: Medicare Other

## 2015-02-16 DIAGNOSIS — R531 Weakness: Secondary | ICD-10-CM

## 2015-02-16 DIAGNOSIS — R2681 Unsteadiness on feet: Secondary | ICD-10-CM

## 2015-02-16 NOTE — Therapy (Addendum)
[place] MAIN [facility] SERVICES [address] [place], [place], [address] Phone: [phone]   Fax:  [phone]  Physical Therapy Treatment  Patient Details  Name: Laura Carpenter MRN: [id number] Date of Birth: 1935-09-10 Referring Provider: Lemmie Evens shah  Encounter Date: 02/16/2015      PT End of Session - 02/16/15 1219    Visit Number 4   Number of Visits 9   Date for PT Re-Evaluation 03/04/15   Authorization Type 4/10   PT Start Time 1117   PT Stop Time 1201   PT Time Calculation (min) 44 min   Equipment Utilized During Treatment Gait belt   Activity Tolerance Patient tolerated treatment well   Behavior During Therapy [facility] for tasks assessed/performed      Past Medical History  Diagnosis Date  . History of radiation therapy 2011  . Sleep apnea   . Hemorrhoid   . Hypothyroidism   . Dementia   . Dizziness   . Hypertension   . Fibromuscular dysplasia of renal artery ([place])     1997 Renal angioplasty at [facility]. She subsequently underwent repeat angioplasty a few years later. 12/2009 Abdominal Duplex: <60% bilateral RAS, Sequential narrowing and dilations of renal arteries, suggests fibromuscular dysplasia.   . Otalgia of right ear 11/10/2011  . Chronic hyponatremia     Hyponatremia for which she has had since somewhere in the mid 1990's. She's had multiple hospitalization, one hospitalization in 1993, she was diagnosed with an EF of 25% as well, though coronaries were clean during that hospitalization. The cath showed an EF of 50%. She is being worked up for her hyponatremia at [employer].  . H/O electrolyte imbalance   . Ingrowing nail, left great toe   . Polycythemia   . Allergic rhinitis   . Hearing loss   . Breast cancer ([place])     02/17/10: right lumpectomy reveals 0.8 cm of invasive ductal carcinoma with 1/3 LN+ for isolated tumor cell cluster in Oncotype recurrent score 19 or 12% chance of distant recurrence; Femara self-discontinued secondary to  side effects. She continues to decline further therapy  . Skin cancer     squamous cell skin cancer removed by Dermatology--arms and hands    Past Surgical History  Procedure Laterality Date  . Breast lumpectomy Right 2012    performed [facility]  . Core biopsy breast Right 2012    ultrasound guided core needle biopsy of the right breast reveals invasive ductal carcinoma, ER/PR positive, HER2 neu   . Fracture surgery  2010  . Angioplasty of right renal artery  05/04/1995    There were no vitals filed for this visit.  Visit Diagnosis:  Unsteadiness on feet  Weakness      Subjective Assessment - 02/16/15 1210    Subjective pt reports not doing all of her HEP. pt reports feeling more dizzy in activities with looking down.   Patient is accompained by: Family member   Patient Stated Goals would like to walk normal and go and down steps   Currently in Pain? No/denies   Pain Score 0-No pain        Neuromuscular re ed:  Forward ambulation with R and L head turns 75 ft x 4 laps Lateral great vine R and L 75 ft x 2 Retro ambulation 75 ft x 2 laps  Pt required min cues for foot placement and upright posture  Pt required CGA for safety  Lateral great vine with R and L head turns  75 x 2 laps   pt required mod to max verbal cues in head turns and foot placement with mod-A needed for safety  Toe touch to BOSU ball from airex forward, R and L 10 reps x 2 Star touch R and L x 3   dynadisc step up 6 in x 10 with 1 hand UE support  pt requires CGA-min assist  For safety and verbal cues for upright posture            PT Long Term Goals - 02/04/15 1409    PT LONG TERM GOAL #1   Title pt will be able to ascend/descend 4 steps with step over step pattern and without UE assist   Time 4   Period Weeks   Status New   PT LONG TERM GOAL #2   Title pt's 5x sit to stand will be less than 10 seconds indicating improved LE functional strength    Period Days   Status New   PT LONG TERM  GOAL #3   Title pt's timed up and go time will be less than 10 seconds indicating decreased fall risk   Time 4   Period Weeks   Status New   PT LONG TERM GOAL #4   Title pt will score >52/56 on berg balance test as a low fall risk   Time 4   Period Weeks   Status New   PT LONG TERM GOAL #5   Title pt will score >19/24 on DGI signifying a low fall risk    Time 4   Period Weeks   Status New               Plan - 02/16/15 1222    Clinical Impression Statement pt demonstrates good session carry over in gait step length and swing phase. pt continues to struggle in dynamic balance actvities and activities that require multiple components of movement.   Pt will benefit from skilled therapeutic intervention in order to improve on the following deficits Decreased strength;Decreased balance;Decreased activity tolerance;Decreased range of motion;Difficulty walking;Impaired flexibility   Rehab Potential Fair   Clinical Impairments Affecting Rehab Potential decreased right ankle ROM from previous surgery    PT Frequency 2x / week   PT Duration 4 weeks   PT Treatment/Interventions Cryotherapy;Moist Heat;Therapeutic exercise;Therapeutic activities;Functional mobility training;Stair training;Gait training;Balance training;Neuromuscular re-education;Patient/family education;Manual techniques   PT Next Visit Plan Progress narrow BOS, LE strengthening.    Consulted and Agree with Plan of Care Patient        Problem List Patient Active Problem List   Diagnosis Date Noted  . Polycythemia, secondary 10/17/2014    Domingo Pulse, SPT This entire session was performed under direct supervision and direction of a licensed therapist/therapist assistant . I have personally read, edited and approve of the note as written.  Gorden Harms. [name], PT, DPT [phone]  [name],[name] 02/16/2015, 1:20 PM  [place] MAIN [facility] SERVICES [address] [place],  [place], [address] Phone: [phone]   Fax:  [phone]  Name: Laura Carpenter MRN: [id number] Date of Birth: 02/03/1935

## 2015-02-18 ENCOUNTER — Ambulatory Visit: Payer: Medicare Other

## 2015-02-18 DIAGNOSIS — R2681 Unsteadiness on feet: Secondary | ICD-10-CM | POA: Diagnosis not present

## 2015-02-18 DIAGNOSIS — R531 Weakness: Secondary | ICD-10-CM

## 2015-02-18 NOTE — Therapy (Signed)
[place] MAIN [facility] SERVICES [address] [place], [place], [address] Phone: [phone]   Fax:  [phone]  Physical Therapy Treatment  Patient Details  Name: Laura Carpenter MRN: [id number] Date of Birth: 1935-10-19 Referring Provider: Lemmie Evens shah  Encounter Date: 02/18/2015      PT End of Session - 02/18/15 1422    Visit Number 5   Number of Visits 9   Date for PT Re-Evaluation 03/04/15   Authorization Type 5/10   PT Start Time 1300   PT Stop Time 1347   PT Time Calculation (min) 47 min   Equipment Utilized During Treatment Gait belt   Activity Tolerance Patient tolerated treatment well   Behavior During Therapy [facility] for tasks assessed/performed      Past Medical History  Diagnosis Date  . History of radiation therapy 2011  . Sleep apnea   . Hemorrhoid   . Hypothyroidism   . Dementia   . Dizziness   . Hypertension   . Fibromuscular dysplasia of renal artery ([place])     1997 Renal angioplasty at [facility]. She subsequently underwent repeat angioplasty a few years later. 12/2009 Abdominal Duplex: <60% bilateral RAS, Sequential narrowing and dilations of renal arteries, suggests fibromuscular dysplasia.   . Otalgia of right ear 11/10/2011  . Chronic hyponatremia     Hyponatremia for which she has had since somewhere in the mid 1990's. She's had multiple hospitalization, one hospitalization in 1993, she was diagnosed with an EF of 25% as well, though coronaries were clean during that hospitalization. The cath showed an EF of 50%. She is being worked up for her hyponatremia at [employer].  . H/O electrolyte imbalance   . Ingrowing nail, left great toe   . Polycythemia   . Allergic rhinitis   . Hearing loss   . Breast cancer ([place])     02/17/10: right lumpectomy reveals 0.8 cm of invasive ductal carcinoma with 1/3 LN+ for isolated tumor cell cluster in Oncotype recurrent score 19 or 12% chance of distant recurrence; Femara self-discontinued secondary to  side effects. She continues to decline further therapy  . Skin cancer     squamous cell skin cancer removed by Dermatology--arms and hands    Past Surgical History  Procedure Laterality Date  . Breast lumpectomy Right 2012    performed [facility]  . Core biopsy breast Right 2012    ultrasound guided core needle biopsy of the right breast reveals invasive ductal carcinoma, ER/PR positive, HER2 neu   . Fracture surgery  2010  . Angioplasty of right renal artery  05/04/1995    There were no vitals filed for this visit.  Visit Diagnosis:  Unsteadiness on feet  Weakness      Subjective Assessment - 02/18/15 1410    Subjective pt reports she has not been doing HEP. pt reports that she no episodes of falls since last visit but is feeling more dizzy stating she thinks it may be vertigo due to family history.   Patient is accompained by: Family member   Patient Stated Goals would like to walk normal and go and down steps   Currently in Pain? No/denies   Pain Score 0-No pain        Neuromuscular re ed:  Ambulation with bilateral head turns 70 ft x 3 laps pt required min cues for stride length and CGA Ambulation with superior and inferior gaze 70 ft x 2 laps pt required min cues for stride length and CGA Staggered  stance on airex with ball and visual tracking in D2 UE pattern bilaterally 10 x 2 sets pt required min cues for ball watch and control with CGA for safety Staggered stance on airex with ball and visual tracking in L to R and R to L pattern 10 x 2 sets pt required min cues for ball watch and control with CGA for safety Staggered stance on airex with ball and visual tracking in inferior to superior pattern 10 x 2 sets pt required min cues for ball watch and control with CGA for safety SLS bilaterally in // bars with 1 finger UE support 20 sec x 3 sets pt required CGA for safety Tandem ambulation in // bars on 2x4 for 3 laps pt required min cues for foot placement and min assist for  safety Agility ladder in 2 steps forward and 1 step back pattern x 2 laps pt required min cues for foot placement and CGA Agility ladder in 2 steps forward 1 step back and posterolateral step pattern x 2 laps pt required min cues for foot placement and CGA           PT Education - 02/18/15 1409    Education provided Yes   Education Details pt educated on importance of HEP   Person(s) Educated Patient   Methods Explanation   Comprehension Verbalized understanding             PT Long Term Goals - 02/04/15 1409    PT LONG TERM GOAL #1   Title pt will be able to ascend/descend 4 steps with step over step pattern and without UE assist   Time 4   Period Weeks   Status New   PT LONG TERM GOAL #2   Title pt's 5x sit to stand will be less than 10 seconds indicating improved LE functional strength    Period Days   Status New   PT LONG TERM GOAL #3   Title pt's timed up and go time will be less than 10 seconds indicating decreased fall risk   Time 4   Period Weeks   Status New   PT LONG TERM GOAL #4   Title pt will score >52/56 on berg balance test as a low fall risk   Time 4   Period Weeks   Status New   PT LONG TERM GOAL #5   Title pt will score >19/24 on DGI signifying a low fall risk    Time 4   Period Weeks   Status New               Plan - 02/18/15 1415    Clinical Impression Statement pt demonstrates inproved balance dynamic activities with head turns and turning. pt struggles in positions of NBOS and SLS. pt is non compliant with HEP which can negatively effect progress.    Pt will benefit from skilled therapeutic intervention in order to improve on the following deficits Decreased strength;Decreased balance;Decreased activity tolerance;Decreased range of motion;Difficulty walking;Impaired flexibility   Rehab Potential Fair   Clinical Impairments Affecting Rehab Potential decreased right ankle ROM from previous surgery    PT Frequency 2x / week   PT  Duration 4 weeks   PT Treatment/Interventions Cryotherapy;Moist Heat;Therapeutic exercise;Therapeutic activities;Functional mobility training;Stair training;Gait training;Balance training;Neuromuscular re-education;Patient/family education;Manual techniques   PT Next Visit Plan Progress narrow BOS, LE strengthening.    Consulted and Agree with Plan of Care Patient        Problem List Patient Active Problem List  Diagnosis Date Noted  . Polycythemia, secondary 10/17/2014   Domingo Pulse, SPT This entire session was performed under direct supervision and direction of a licensed therapist/therapist assistant . I have personally read, edited and approve of the note as written. Gorden Harms. [name], PT, DPT [phone]  [name],[name] 02/18/2015, 5:20 PM  [place] MAIN [facility] SERVICES [address] [place], [place], [address] Phone: [phone]   Fax:  [phone]  Name: Laura Carpenter MRN: [id number] Date of Birth: Jun 02, 1935

## 2015-02-23 ENCOUNTER — Ambulatory Visit: Payer: Medicare Other

## 2015-02-23 DIAGNOSIS — R531 Weakness: Secondary | ICD-10-CM

## 2015-02-23 DIAGNOSIS — R2681 Unsteadiness on feet: Secondary | ICD-10-CM | POA: Diagnosis not present

## 2015-02-23 NOTE — Therapy (Signed)
[place] MAIN [facility] SERVICES [address] [place], [place], [address] Phone: [phone]   Fax:  [phone]  Physical Therapy Treatment  Patient Details  Name: Laura Carpenter MRN: [id number] Date of Birth: Nov 30, 1935 Referring Provider: Lemmie Evens shah  Encounter Date: 02/23/2015      PT End of Session - 02/23/15 1218    Visit Number 6   Number of Visits 9   Date for PT Re-Evaluation 03/04/15   Authorization Type 6/10   PT Start Time 1115   PT Stop Time 1200   PT Time Calculation (min) 45 min   Equipment Utilized During Treatment Gait belt   Activity Tolerance Patient tolerated treatment well   Behavior During Therapy [facility] for tasks assessed/performed      Past Medical History  Diagnosis Date  . History of radiation therapy 2011  . Sleep apnea   . Hemorrhoid   . Hypothyroidism   . Dementia   . Dizziness   . Hypertension   . Fibromuscular dysplasia of renal artery ([place])     1997 Renal angioplasty at [facility]. She subsequently underwent repeat angioplasty a few years later. 12/2009 Abdominal Duplex: <60% bilateral RAS, Sequential narrowing and dilations of renal arteries, suggests fibromuscular dysplasia.   . Otalgia of right ear 11/10/2011  . Chronic hyponatremia     Hyponatremia for which she has had since somewhere in the mid 1990's. She's had multiple hospitalization, one hospitalization in 1993, she was diagnosed with an EF of 25% as well, though coronaries were clean during that hospitalization. The cath showed an EF of 50%. She is being worked up for her hyponatremia at [facility].  . H/O electrolyte imbalance   . Ingrowing nail, left great toe   . Polycythemia   . Allergic rhinitis   . Hearing loss   . Breast cancer ([place])     02/17/10: right lumpectomy reveals 0.8 cm of invasive ductal carcinoma with 1/3 LN+ for isolated tumor cell cluster in Oncotype recurrent score 19 or 12% chance of distant recurrence; Femara self-discontinued secondary to  side effects. She continues to decline further therapy  . Skin cancer     squamous cell skin cancer removed by Dermatology--arms and hands    Past Surgical History  Procedure Laterality Date  . Breast lumpectomy Right 2012    performed [facility]  . Core biopsy breast Right 2012    ultrasound guided core needle biopsy of the right breast reveals invasive ductal carcinoma, ER/PR positive, HER2 neu   . Fracture surgery  2010  . Angioplasty of right renal artery  05/04/1995    There were no vitals filed for this visit.  Visit Diagnosis:  Unsteadiness on feet  Weakness      Subjective Assessment - 02/23/15 1206    Subjective pt reports she was unable to do HEP due to travel this past weekend. pt reports feeling a little more unsteadiness in looking up and down with walking. pt asked how many session of therapy remain   Patient is accompained by: Family member   Patient Stated Goals would like to walk normal and go and down steps   Currently in Pain? No/denies         Neuromuscular re ed: ambulation with bilateral head turns 70 ft x 4 laps Ambulation with glancing up and down 70 ft x 2 laps  Pt required CGA for safety  Tandem stance on airex in // bars 30 sec x 3 sets Tandem forward and retro gait  on 2x4 in // bars 4 laps  8 point star toe touch 5 x bilaterally  Pt required min cues for foot placement with min assist for safety  Therapeutic exercise: Squats in // bars with red band at knees 10 x 3 sets Lateral band walks with red band at ankles 2 laps in // bars x 3 sets  Pt required mod cues to keep knees apart and glut activation with CGA for safety           PT Long Term Goals - 02/04/15 1409    PT LONG TERM GOAL #1   Title pt will be able to ascend/descend 4 steps with step over step pattern and without UE assist   Time 4   Period Weeks   Status New   PT LONG TERM GOAL #2   Title pt's 5x sit to stand will be less than 10 seconds indicating improved LE  functional strength    Period Days   Status New   PT LONG TERM GOAL #3   Title pt's timed up and go time will be less than 10 seconds indicating decreased fall risk   Time 4   Period Weeks   Status New   PT LONG TERM GOAL #4   Title pt will score >52/56 on berg balance test as a low fall risk   Time 4   Period Weeks   Status New   PT LONG TERM GOAL #5   Title pt will score >19/24 on DGI signifying a low fall risk    Time 4   Period Weeks   Status New               Plan - 02/23/15 1218    Clinical Impression Statement pt demonstrates improved dynamic balance in ambulation with head turns. pt has difficulty with retro stepping.    Pt will benefit from skilled therapeutic intervention in order to improve on the following deficits Decreased strength;Decreased balance;Decreased activity tolerance;Decreased range of motion;Difficulty walking;Impaired flexibility   Rehab Potential Fair   Clinical Impairments Affecting Rehab Potential decreased right ankle ROM from previous surgery    PT Frequency 2x / week   PT Duration 4 weeks   PT Treatment/Interventions Cryotherapy;Moist Heat;Therapeutic exercise;Therapeutic activities;Functional mobility training;Stair training;Gait training;Balance training;Neuromuscular re-education;Patient/family education;Manual techniques   PT Next Visit Plan Progress narrow BOS, LE strengthening.    Consulted and Agree with Plan of Care Patient        Problem List Patient Active Problem List   Diagnosis Date Noted  . Polycythemia, secondary 10/17/2014   Domingo Pulse, SPT This entire session was performed under direct supervision and direction of a licensed therapist/therapist assistant . I have personally read, edited and approve of the note as written. Gorden Harms. [name], PT, DPT [phone]  [name],[name] 02/23/2015, 1:22 PM  [place] MAIN [facility] SERVICES [address] [place], [place], [address] Phone:  [phone]   Fax:  [phone]  Name: MADOLYN ACKROYD MRN: [id number] Date of Birth: 1935-12-03

## 2015-02-25 ENCOUNTER — Ambulatory Visit: Payer: Medicare Other | Attending: Neurology

## 2015-02-25 DIAGNOSIS — R531 Weakness: Secondary | ICD-10-CM | POA: Diagnosis present

## 2015-02-25 DIAGNOSIS — R2681 Unsteadiness on feet: Secondary | ICD-10-CM

## 2015-02-25 NOTE — Therapy (Signed)
[place] MAIN [facility] SERVICES [address] [place], [place], [address] Phone: [phone]   Fax:  [phone]  Physical Therapy Treatment/ Discharge Summary  Patient Details  Name: Laura Carpenter MRN: [id number] Date of Birth: 11-06-1935 Referring Provider: Lemmie Evens shah  Encounter Date: 02/25/2015      PT End of Session - 02/25/15 1407    Visit Number 7   Number of Visits 9   Date for PT Re-Evaluation 03/04/15   Authorization Type 7/10   PT Start Time 1300   PT Stop Time 1342   PT Time Calculation (min) 42 min   Equipment Utilized During Treatment Gait belt   Activity Tolerance Patient tolerated treatment well   Behavior During Therapy [facility] for tasks assessed/performed      Past Medical History  Diagnosis Date  . History of radiation therapy 2011  . Sleep apnea   . Hemorrhoid   . Hypothyroidism   . Dementia   . Dizziness   . Hypertension   . Fibromuscular dysplasia of renal artery ([place])     1997 Renal angioplasty at [facility]. She subsequently underwent repeat angioplasty a few years later. 12/2009 Abdominal Duplex: <60% bilateral RAS, Sequential narrowing and dilations of renal arteries, suggests fibromuscular dysplasia.   . Otalgia of right ear 11/10/2011  . Chronic hyponatremia     Hyponatremia for which she has had since somewhere in the mid 1990's. She's had multiple hospitalization, one hospitalization in 1993, she was diagnosed with an EF of 25% as well, though coronaries were clean during that hospitalization. The cath showed an EF of 50%. She is being worked up for her hyponatremia at [facility].  . H/O electrolyte imbalance   . Ingrowing nail, left great toe   . Polycythemia   . Allergic rhinitis   . Hearing loss   . Breast cancer ([place])     02/17/10: right lumpectomy reveals 0.8 cm of invasive ductal carcinoma with 1/3 LN+ for isolated tumor cell cluster in Oncotype recurrent score 19 or 12% chance of distant recurrence; Femara  self-discontinued secondary to side effects. She continues to decline further therapy  . Skin cancer     squamous cell skin cancer removed by Dermatology--arms and hands    Past Surgical History  Procedure Laterality Date  . Breast lumpectomy Right 2012    performed [facility]  . Core biopsy breast Right 2012    ultrasound guided core needle biopsy of the right breast reveals invasive ductal carcinoma, ER/PR positive, HER2 neu   . Fracture surgery  2010  . Angioplasty of right renal artery  05/04/1995    There were no vitals filed for this visit.  Visit Diagnosis:  Unsteadiness on feet  Weakness      Subjective Assessment - 02/25/15 1403    Subjective pt reports poor compliance with HEP but feels like her balance is better.   Patient is accompained by: Family member   Patient Stated Goals would like to walk normal and go and down steps   Currently in Pain? No/denies   Pain Score 0-No pain     Therapeutic exercise:    SPT re-assessed outcome measures and progress toward goals as follows:  5xSTS-12.31 sec TUG- 9.94 Berg- 54/56 DGI- 22/24 Ascend and descend 4 stairs in step over step pattern with no UE support- achieved                           PT Education -  03/24/15 1405    Education provided Yes   Education Details pt educated in progress note findings with progress toward goals, further plan of care and in HEP exercises for discharge   Person(s) Educated Patient   Methods Explanation   Comprehension Verbalized understanding             PT Long Term Goals - 24-Mar-2015 1407    PT LONG TERM GOAL #1   Title pt will be able to ascend/descend 4 steps with step over step pattern and without UE assist   Time 4   Period Weeks   Status Achieved   PT LONG TERM GOAL #2   Title pt's 5x sit to stand will be less than 10 seconds indicating improved LE functional strength    Period Days   Status Not Met   PT LONG TERM GOAL #3   Title pt's timed up  and go time will be less than 10 seconds indicating decreased fall risk   Time 4   Period Weeks   Status Achieved   PT LONG TERM GOAL #4   Title pt will score >52/56 on berg balance test as a low fall risk   Time 4   Period Weeks   Status Achieved   PT LONG TERM GOAL #5   Title pt will score >19/24 on DGI signifying a low fall risk    Time 4   Period Weeks   Status Achieved               Plan - 03-24-2015 1408    Clinical Impression Statement pt demonstrates improvement in static and dynamic balance activities according to scores on the Berg and DGI outcome measures and is now in the low fall risk category. pt has some difficulty in SLS positions and was provided a HEP with progression to imrpove balance. patient has achieved PT goals and will be DC to HEP following todays visit as skilled PT services are no longer needed.    Pt will benefit from skilled therapeutic intervention in order to improve on the following deficits Decreased strength;Decreased balance;Decreased activity tolerance;Decreased range of motion;Difficulty walking;Impaired flexibility   Rehab Potential Fair   Clinical Impairments Affecting Rehab Potential decreased right ankle ROM from previous surgery    PT Frequency 2x / week   PT Duration 4 weeks   PT Treatment/Interventions Cryotherapy;Moist Heat;Therapeutic exercise;Therapeutic activities;Functional mobility training;Stair training;Gait training;Balance training;Neuromuscular re-education;Patient/family education;Manual techniques   PT Next Visit Plan Progress narrow BOS, LE strengthening.    Consulted and Agree with Plan of Care Patient          G-Codes - 2015-03-24 1702    Functional Assessment Tool Used history, clinical judgment, outcome measures    Functional Limitation Mobility: Walking and moving around   Mobility: Walking and Moving Around Current Status [phone]) At least 1 percent but less than 20 percent impaired, limited or restricted    Mobility: Walking and Moving Around Goal Status [phone]) At least 1 percent but less than 20 percent impaired, limited or restricted   Mobility: Walking and Moving Around Discharge Status [phone]) At least 1 percent but less than 20 percent impaired, limited or restricted      Problem List Patient Active Problem List   Diagnosis Date Noted  . Polycythemia, secondary 10/17/2014   Domingo Pulse, SPT This entire session was performed under direct supervision and direction of a licensed therapist/therapist assistant . I have personally read, edited and approve of the note as written. Gorden Harms.  [name], PT, DPT [phone]   [name],[name] 02/25/2015, 5:03 PM  [place] MAIN [facility] SERVICES [address] [place], [place], [address] Phone: [phone]   Fax:  [phone]  Name: AFRA TRICARICO MRN: [id number] Date of Birth: November 06, 1935

## 2015-06-05 ENCOUNTER — Inpatient Hospital Stay: Payer: Medicare Other

## 2015-06-05 ENCOUNTER — Inpatient Hospital Stay: Payer: Medicare Other | Admitting: Internal Medicine

## 2015-06-12 ENCOUNTER — Ambulatory Visit: Payer: Medicare Other | Admitting: Internal Medicine

## 2015-06-12 ENCOUNTER — Other Ambulatory Visit: Payer: Medicare Other

## 2015-06-12 ENCOUNTER — Ambulatory Visit: Payer: Medicare Other

## 2015-06-12 ENCOUNTER — Inpatient Hospital Stay: Payer: Medicare Other

## 2015-06-12 ENCOUNTER — Encounter: Payer: Self-pay | Admitting: *Deleted

## 2015-06-12 ENCOUNTER — Inpatient Hospital Stay: Payer: Medicare Other | Admitting: Internal Medicine

## 2015-06-26 ENCOUNTER — Other Ambulatory Visit: Payer: Medicare Other

## 2015-06-26 ENCOUNTER — Ambulatory Visit: Payer: Medicare Other

## 2015-06-26 ENCOUNTER — Ambulatory Visit: Payer: Medicare Other | Admitting: Internal Medicine

## 2015-06-29 ENCOUNTER — Inpatient Hospital Stay: Payer: Medicare Other

## 2015-06-29 ENCOUNTER — Inpatient Hospital Stay: Payer: Medicare Other | Attending: Internal Medicine

## 2015-06-29 ENCOUNTER — Inpatient Hospital Stay (HOSPITAL_BASED_OUTPATIENT_CLINIC_OR_DEPARTMENT_OTHER): Payer: Medicare Other | Admitting: Internal Medicine

## 2015-06-29 ENCOUNTER — Telehealth: Payer: Self-pay | Admitting: *Deleted

## 2015-06-29 VITALS — BP 101/68 | HR 74 | Temp 98.9°F | Resp 18 | Wt 116.2 lb

## 2015-06-29 DIAGNOSIS — Z87891 Personal history of nicotine dependence: Secondary | ICD-10-CM

## 2015-06-29 DIAGNOSIS — Z79899 Other long term (current) drug therapy: Secondary | ICD-10-CM

## 2015-06-29 DIAGNOSIS — E039 Hypothyroidism, unspecified: Secondary | ICD-10-CM | POA: Diagnosis not present

## 2015-06-29 DIAGNOSIS — E860 Dehydration: Secondary | ICD-10-CM | POA: Diagnosis not present

## 2015-06-29 DIAGNOSIS — F0390 Unspecified dementia without behavioral disturbance: Secondary | ICD-10-CM | POA: Insufficient documentation

## 2015-06-29 DIAGNOSIS — R197 Diarrhea, unspecified: Secondary | ICD-10-CM | POA: Insufficient documentation

## 2015-06-29 DIAGNOSIS — Z85828 Personal history of other malignant neoplasm of skin: Secondary | ICD-10-CM | POA: Diagnosis not present

## 2015-06-29 DIAGNOSIS — Z808 Family history of malignant neoplasm of other organs or systems: Secondary | ICD-10-CM | POA: Insufficient documentation

## 2015-06-29 DIAGNOSIS — E871 Hypo-osmolality and hyponatremia: Secondary | ICD-10-CM | POA: Insufficient documentation

## 2015-06-29 DIAGNOSIS — I959 Hypotension, unspecified: Secondary | ICD-10-CM

## 2015-06-29 DIAGNOSIS — N179 Acute kidney failure, unspecified: Secondary | ICD-10-CM

## 2015-06-29 DIAGNOSIS — Z7982 Long term (current) use of aspirin: Secondary | ICD-10-CM

## 2015-06-29 DIAGNOSIS — D751 Secondary polycythemia: Secondary | ICD-10-CM

## 2015-06-29 DIAGNOSIS — G4733 Obstructive sleep apnea (adult) (pediatric): Secondary | ICD-10-CM

## 2015-06-29 DIAGNOSIS — Z803 Family history of malignant neoplasm of breast: Secondary | ICD-10-CM | POA: Diagnosis not present

## 2015-06-29 DIAGNOSIS — Z85821 Personal history of Merkel cell carcinoma: Secondary | ICD-10-CM

## 2015-06-29 HISTORY — DX: Dehydration: E86.0

## 2015-06-29 LAB — CBC WITH DIFFERENTIAL/PLATELET
BASOS ABS: 0 10*3/uL (ref 0–0.1)
Basophils Relative: 0 %
EOS PCT: 0 %
Eosinophils Absolute: 0 10*3/uL (ref 0–0.7)
HCT: 45.2 % (ref 35.0–47.0)
Hemoglobin: 15.6 g/dL (ref 12.0–16.0)
LYMPHS ABS: 1.3 10*3/uL (ref 1.0–3.6)
LYMPHS PCT: 17 %
MCH: 33 pg (ref 26.0–34.0)
MCHC: 34.4 g/dL (ref 32.0–36.0)
MCV: 95.7 fL (ref 80.0–100.0)
MONO ABS: 1.2 10*3/uL — AB (ref 0.2–0.9)
Monocytes Relative: 16 %
Neutro Abs: 4.8 10*3/uL (ref 1.4–6.5)
Neutrophils Relative %: 67 %
Platelets: 210 10*3/uL (ref 150–440)
RBC: 4.73 MIL/uL (ref 3.80–5.20)
RDW: 13.6 % (ref 11.5–14.5)
WBC: 7.3 10*3/uL (ref 3.6–11.0)

## 2015-06-29 LAB — BASIC METABOLIC PANEL
Anion gap: 8 (ref 5–15)
BUN: 37 mg/dL — AB (ref 6–20)
CALCIUM: 9.4 mg/dL (ref 8.9–10.3)
CO2: 27 mmol/L (ref 22–32)
CREATININE: 1.96 mg/dL — AB (ref 0.44–1.00)
Chloride: 91 mmol/L — ABNORMAL LOW (ref 101–111)
GFR calc non Af Amer: 23 mL/min — ABNORMAL LOW (ref >60)
GFR, EST AFRICAN AMERICAN: 27 mL/min — AB (ref >60)
Glucose, Bld: 125 mg/dL — ABNORMAL HIGH (ref 65–99)
Potassium: 3.5 mmol/L (ref 3.5–5.1)
Sodium: 126 mmol/L — ABNORMAL LOW (ref 135–145)

## 2015-06-29 MED ORDER — SODIUM CHLORIDE 0.9 % IV SOLN
INTRAVENOUS | Status: DC
  Administered 2015-06-29: 15:00:00 via INTRAVENOUS
  Filled 2015-06-29: qty 1000

## 2015-06-29 NOTE — Telephone Encounter (Signed)
Left msg for pcp office with [facility] clinic reception.  Explained that patient had an elevated creatinine level today at 1.96. Explained to pcp office that the Pt/pt's husband were both instructed to hold lisinopril. Pt instructed to take Norvasc 5 mg daily. If systolic BP greater than [id number], then pt should take 2 tablets of Norvasc 5 mg on that day. Pt also instructed to check bp daily. She should have her labs checked in 1 wk to recheck bmp. RN asked if PCP will recheck bmp and offer further advice to pt/pt's husband for bp mgmt and elevated creat. level.

## 2015-06-29 NOTE — Progress Notes (Signed)
.[place] CONSULT NOTE  PCP: Dr.Jasmine Candiss Norse MD  CHIEF COMPLAINTS/PURPOSE OF CONSULTATION: Polycythemia  ONCOLOGIC HISTORY:  # 2016- ISOLATED ERYTHROCYTOSIS likely SECONDARY [ ? OSA vs RAS; Jak-2 NEG] if Hct > 45  # 2012- STAGE I RIGHT BREAST CA [[facility]] ER/PR-POS; Her-2 NEG s/p LUMEPC- RT;ONCTYPE- RS 19; NO CHEMO; INTOL to AI.   # OSA on CPAP; Hx Fibromuscular dysplasia [1997 s/p Angioplasty [facility]]  HISTORY OF PRESENTING ILLNESS:   Laura Carpenter 80 y.o. female   Patient with history of dementia/ Also isolated Erythrocytosis likely secondary is here for follow-up.  Patient is a poor historian as per the husband patient had been feeling poorly over the last few days. She had a bout of diarrhea approximate 4 days ago. They attribute  This to outside food.  Diarrhea resolved.    Patient has feeling dizzy;  Poor appetite. Positive for nausea no vomiting.  No syncopal episodes no falls.  REVIEW OF SYSTEMS:   Difficult to assess  Review of system given patient's poor history giving skills.   MEDICAL HISTORY:  Past Medical History  Diagnosis Date  . History of radiation therapy 2011  . Sleep apnea   . Hemorrhoid   . Hypothyroidism   . Dementia   . Dizziness   . Hypertension   . Fibromuscular dysplasia of renal artery ([place])     1997 Renal angioplasty at [facility]. She subsequently underwent repeat angioplasty a few years later. 12/2009 Abdominal Duplex: <60% bilateral RAS, Sequential narrowing and dilations of renal arteries, suggests fibromuscular dysplasia.   . Otalgia of right ear 11/10/2011  . Chronic hyponatremia     Hyponatremia for which she has had since somewhere in the mid 1990's. She's had multiple hospitalization, one hospitalization in 1993, she was diagnosed with an EF of 25% as well, though coronaries were clean during that hospitalization. The cath showed an EF of 50%. She is being worked up for her hyponatremia at [facility].  . H/O electrolyte imbalance   .  Ingrowing nail, left great toe   . Polycythemia   . Allergic rhinitis   . Hearing loss   . Breast cancer ([place])     02/17/10: right lumpectomy reveals 0.8 cm of invasive ductal carcinoma with 1/3 LN+ for isolated tumor cell cluster in Oncotype recurrent score 19 or 12% chance of distant recurrence; Femara self-discontinued secondary to side effects. She continues to decline further therapy  . Skin cancer     squamous cell skin cancer removed by Dermatology--arms and hands    SURGICAL HISTORY: Past Surgical History  Procedure Laterality Date  . Breast lumpectomy Right 2012    performed [facility]  . Core biopsy breast Right 2012    ultrasound guided core needle biopsy of the right breast reveals invasive ductal carcinoma, ER/PR positive, HER2 neu   . Fracture surgery  2010  . Angioplasty of right renal artery  05/04/1995    SOCIAL HISTORY: Social History   Social History  . Marital Status: Married    Spouse Name: N/A  . Number of Children: N/A  . Years of Education: N/A   Occupational History  . Not on file.   Social History Main Topics  . Smoking status: Former Smoker -- 0.25 packs/day for 20 years    Types: Cigarettes  . Smokeless tobacco: Former Systems developer    Quit date: 05/30/1976     Comment: early teens; early college smoking Quit: 05/30/1976  . Alcohol Use: 0.0 oz/week    0  Standard drinks or equivalent per week     Comment: occasional social drinker  . Drug Use: No  . Sexual Activity:    Partners: Male   Other Topics Concern  . Not on file   Social History Narrative    FAMILY HISTORY: Family History  Problem Relation Age of Onset  . Breast cancer Mother   . Hypertension Mother   . Alcohol abuse Father   . Cirrhosis Father   . Hypertension Father   . Heart disease Brother   . Hypertension Daughter   . Skin cancer Daughter     ALLERGIES:  has No Known Allergies.  MEDICATIONS:  Current Outpatient Prescriptions  Medication Sig Dispense Refill  . amLODipine  (NORVASC) 5 MG tablet Take 1 tablet by mouth daily.  3  . aspirin 81 MG tablet Take 81 mg by mouth daily.    . cyanocobalamin (,VITAMIN B-12,) 1000 MCG/ML injection Inject 1,000 mcg into the muscle every 30 (thirty) days.    Marland Kitchen desonide (DESOWEN) 0.05 % cream Apply 1 application topically 2 (two) times daily.    Marland Kitchen donepezil (ARICEPT) 5 MG tablet Take 1 tablet by mouth daily. Reported on 02/06/2015  0  . fluticasone (FLONASE) 50 MCG/ACT nasal spray Place 2 sprays into both nostrils daily.  4  . levothyroxine (SYNTHROID, LEVOTHROID) 100 MCG tablet Take 1 tablet by mouth daily.  2  . lisinopril (PRINIVIL,ZESTRIL) 40 MG tablet Take 1 tablet by mouth daily.  8  . sertraline (ZOLOFT) 50 MG tablet Take 1 tablet by mouth daily.     Current Facility-Administered Medications  Medication Dose Route Frequency Provider Last Rate Last Dose  . 0.9 %  sodium chloride infusion   Intravenous Continuous Cammie Sickle, MD          PHYSICAL EXAMINATION: ECOG PERFORMANCE STATUS: 1 - Symptomatic but completely ambulatory   GENERAL:alert, no distress and comfortable; accompanied by her husband. EYES: normal, conjunctiva are pink and non-injected, sclera clear OROPHARYNX:no exudate, no erythema and lips, buccal mucosa, and tongue normal  NECK: supple, thyroid normal size, non-tender, without nodularity LYMPH: no palpable lymphadenopathy in the cervical, axillary or inguinal LUNGS: clear to auscultation and percussion with normal breathing effort HEART: regular rate & rhythm and no murmurs and no lower extremity edema ABDOMEN:abdomen soft, non-tender and normal bowel sounds Musculoskeletal:no cyanosis of digits and no clubbing  PSYCH: alert & oriented x 3 with fluent speech NEURO: no focal motor/sensory deficits  Filed Vitals:   06/29/15 1410  BP: 101/68  Pulse: 74  Temp: 98.9 F (37.2 C)  Resp: 18   Filed Weights   06/29/15 1410  Weight: 116 lb 2.9 oz (52.7 kg)      ASSESSMENT & PLAN:    # Isolated erythrocytosis/Secondary polycythemia [Jak-2 NEG] 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 Today hematocrit is 45.2.  Hold off any phlebotomy given the reasons below.   # Dehydration Hyponatremia sodium 126/ Acute renal failure creat 1.96.  Recommend IV fluids 500 cc over one hour.  #  Borderline low blood pressure systolic 465/ patient is usually hypertensive in 160s.  Recommend holding off  Lisinopril.  Recommend checking blood pressure tomorrow morning-  If blood pressure around 120s to 130s-  Recommend taking  Norvasc 5 mg Once a day. However if the systolic blood pressures greater than 140-  Recommend taking Norvasc twice a day. Written instructions were given down to the patient's Husband.  We will convey this to the PCPs office.  Patient will need to have follow-up BMP in 1 week.  #  Diarrhea-/ related to food-  Currently resolved.  # 25 minutes face-to-face with the patient discussing the above plan of care; more than 50% of time spent on prognosis/ natural history; counseling and coordination.    Cammie Sickle, MD 06/29/2015 2:47 PM

## 2015-09-24 DIAGNOSIS — Y92009 Unspecified place in unspecified non-institutional (private) residence as the place of occurrence of the external cause: Secondary | ICD-10-CM | POA: Insufficient documentation

## 2015-09-24 DIAGNOSIS — W19XXXA Unspecified fall, initial encounter: Secondary | ICD-10-CM | POA: Insufficient documentation

## 2015-12-30 ENCOUNTER — Inpatient Hospital Stay: Payer: Medicare Other

## 2015-12-30 ENCOUNTER — Inpatient Hospital Stay: Payer: Medicare Other | Admitting: Internal Medicine

## 2017-10-17 ENCOUNTER — Other Ambulatory Visit: Payer: Self-pay | Admitting: Internal Medicine

## 2017-10-17 DIAGNOSIS — Z1231 Encounter for screening mammogram for malignant neoplasm of breast: Secondary | ICD-10-CM

## 2018-04-24 ENCOUNTER — Telehealth: Payer: Self-pay

## 2018-04-24 NOTE — Telephone Encounter (Signed)
Copied from [place] [phone]. Topic: Appointment Scheduling - Scheduling Inquiry for Clinic >> Apr 24, 2018  1:40 PM Rayann Heman wrote: Reason for CRM: pt daughter called and stated that she would like to schedule a new pt appointment with Karlyn Agee. Please advise

## 2018-06-28 ENCOUNTER — Encounter: Payer: Self-pay | Admitting: Internal Medicine

## 2018-06-28 DIAGNOSIS — Z853 Personal history of malignant neoplasm of breast: Secondary | ICD-10-CM | POA: Insufficient documentation

## 2018-06-28 DIAGNOSIS — G4730 Sleep apnea, unspecified: Secondary | ICD-10-CM | POA: Insufficient documentation

## 2018-06-28 DIAGNOSIS — E785 Hyperlipidemia, unspecified: Secondary | ICD-10-CM | POA: Insufficient documentation

## 2018-06-28 DIAGNOSIS — D259 Leiomyoma of uterus, unspecified: Secondary | ICD-10-CM | POA: Insufficient documentation

## 2018-06-28 DIAGNOSIS — E039 Hypothyroidism, unspecified: Secondary | ICD-10-CM | POA: Insufficient documentation

## 2018-06-28 DIAGNOSIS — E782 Mixed hyperlipidemia: Secondary | ICD-10-CM | POA: Insufficient documentation

## 2018-06-28 DIAGNOSIS — C4490 Unspecified malignant neoplasm of skin, unspecified: Secondary | ICD-10-CM | POA: Insufficient documentation

## 2018-06-28 DIAGNOSIS — F0390 Unspecified dementia without behavioral disturbance: Secondary | ICD-10-CM | POA: Insufficient documentation

## 2018-06-28 DIAGNOSIS — I773 Arterial fibromuscular dysplasia: Secondary | ICD-10-CM | POA: Insufficient documentation

## 2018-06-28 DIAGNOSIS — E538 Deficiency of other specified B group vitamins: Secondary | ICD-10-CM | POA: Insufficient documentation

## 2018-06-28 DIAGNOSIS — C50919 Malignant neoplasm of unspecified site of unspecified female breast: Secondary | ICD-10-CM | POA: Insufficient documentation

## 2018-06-28 DIAGNOSIS — J309 Allergic rhinitis, unspecified: Secondary | ICD-10-CM | POA: Insufficient documentation

## 2018-06-28 DIAGNOSIS — I10 Essential (primary) hypertension: Secondary | ICD-10-CM | POA: Insufficient documentation

## 2018-06-28 DIAGNOSIS — G4733 Obstructive sleep apnea (adult) (pediatric): Secondary | ICD-10-CM | POA: Insufficient documentation

## 2018-06-28 DIAGNOSIS — E871 Hypo-osmolality and hyponatremia: Secondary | ICD-10-CM | POA: Insufficient documentation

## 2018-06-28 HISTORY — DX: Hypo-osmolality and hyponatremia: E87.1

## 2018-06-28 HISTORY — DX: Leiomyoma of uterus, unspecified: D25.9

## 2018-06-29 ENCOUNTER — Ambulatory Visit (INDEPENDENT_AMBULATORY_CARE_PROVIDER_SITE_OTHER): Payer: Medicare Other | Admitting: Internal Medicine

## 2018-06-29 ENCOUNTER — Other Ambulatory Visit: Payer: Self-pay

## 2018-06-29 ENCOUNTER — Encounter: Payer: Self-pay | Admitting: Internal Medicine

## 2018-06-29 DIAGNOSIS — E538 Deficiency of other specified B group vitamins: Secondary | ICD-10-CM

## 2018-06-29 DIAGNOSIS — E039 Hypothyroidism, unspecified: Secondary | ICD-10-CM

## 2018-06-29 DIAGNOSIS — G4730 Sleep apnea, unspecified: Secondary | ICD-10-CM | POA: Diagnosis not present

## 2018-06-29 DIAGNOSIS — Z1389 Encounter for screening for other disorder: Secondary | ICD-10-CM

## 2018-06-29 DIAGNOSIS — F0280 Dementia in other diseases classified elsewhere without behavioral disturbance: Secondary | ICD-10-CM

## 2018-06-29 DIAGNOSIS — E559 Vitamin D deficiency, unspecified: Secondary | ICD-10-CM

## 2018-06-29 DIAGNOSIS — G301 Alzheimer's disease with late onset: Secondary | ICD-10-CM | POA: Diagnosis not present

## 2018-06-29 DIAGNOSIS — I150 Renovascular hypertension: Secondary | ICD-10-CM

## 2018-06-29 DIAGNOSIS — D751 Secondary polycythemia: Secondary | ICD-10-CM

## 2018-06-29 HISTORY — DX: Dementia in other diseases classified elsewhere, unspecified severity, without behavioral disturbance, psychotic disturbance, mood disturbance, and anxiety: F02.80

## 2018-06-29 MED ORDER — LEVOTHYROXINE SODIUM 100 MCG PO TABS: 100.0000 ug | ORAL_TABLET | Freq: Every day | ORAL | 3 refills | Status: DC

## 2018-06-29 NOTE — Progress Notes (Signed)
Virtual Visit via Video Note  I connected with Laura Carpenter  on 06/29/18 at  2:50 PM EDT by a video enabled telemedicine application and verified that I am speaking with the correct person using two identifiers.  Location patient: home Location provider:work  Persons participating in the virtual visit: patient, provider, Pts daughter Laura Carpenter and husband   I discussed the limitations of evaluation and management by telemedicine and the availability of in person appointments. The patient expressed understanding and agreed to proceed.   HPI: 1. 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 Dementia she lives with husband follows with [facility] Wynetta Carpenter [facility] 1x per year on rivastigmine 9.5 mg patch for SDAT 3. H/o secondary polycythemia she has not seen H/o in 3 years per husband used to get blood drained  4. H/o intermittent hyponatremia needing to be hospitalized in the the past  5. hypothyroidisim on levo 100 mcg qd and needs refill   ROS: See pertinent positives and negatives per HPI. General: wt stable  HEENT: no sore throat  CV: no chest pain  Lungs: no sob  Ab: no N/v/d Neuro+dementia  Psych: no anxiety/depression  Skin: denies skin lesions   Past Medical History:  Diagnosis Date  . Breast cancer ([place])    02/17/10: right lumpectomy reveals 0.8 cm of invasive ductal carcinoma with 1/3 LN+ for isolated tumor cell cluster in Oncotype recurrent score 19 or 12% chance of distant recurrence; Femara self-discontinued secondary to side effects. She continues to decline further therapy  . Chronic hyponatremia    Hyponatremia for which she has had since somewhere in the mid 1990's. She's had multiple hospitalization, one hospitalization in 1993, she was diagnosed with an EF of 25% as well, though coronaries were clean during that hospitalization. The cath showed an EF of 50%. She is being worked up for her hyponatremia at [facility].  .  Dementia ([place])   . Dizziness   . 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 She subsequently underwent repeat angioplasty a few years later. 12/2009 Abdominal Duplex: <60% bilateral RAS, Sequential narrowing and dilations of renal arteries, suggests fibromuscular dysplasia.   . H/O electrolyte imbalance   . Hearing loss   . Hemorrhoid   . History of radiation therapy 2011  . Hypertension   . Hypothyroidism   . Ingrowing nail, left great toe   . Otalgia of right ear 11/10/2011  . Polycythemia   . Skin cancer    squamous cell skin cancer removed by Dermatology--arms and hands  . Sleep apnea   . UTI (urinary tract infection)     Past Surgical History:  Procedure Laterality Date  . ANGIOPLASTY OF RIGHT RENAL ARTERY  05/04/1995  . APPENDECTOMY    . BREAST LUMPECTOMY Right 2012   performed [facility]  . core biopsy breast Right 2012   ultrasound guided core needle biopsy of the right breast reveals invasive ductal carcinoma, ER/PR positive, HER2 neu   . FRACTURE SURGERY  2010   right    Family History  Problem Relation Age of Onset  . Hypertension Mother   . Cancer Mother        ? type lived 63   . Alcohol abuse Father        died 73   . Cirrhosis Father   . Hypertension Daughter        Laura Carpenter  .  Cirrhosis Brother        a lot of health problems died 76s-70s   . Hyperlipidemia Son     SOCIAL HX: married 3 kids    Current Outpatient Medications:  .  amLODipine-benazepril (LOTREL) 10-40 MG capsule, , Disp: , Rfl:  .  aspirin 81 MG tablet, Take 81 mg by mouth daily., Disp: , Rfl:  .  levothyroxine (SYNTHROID) 100 MCG tablet, Take 1 tablet (100 mcg total) by mouth daily before breakfast., Disp: 90 tablet, Rfl: 3 .  rivastigmine (EXELON) 9.5 mg/24hr, , Disp: , Rfl:  .  vitamin B-12 (CYANOCOBALAMIN) 1000 MCG tablet, Take by mouth., Disp: , Rfl:   EXAM:  VITALS per patient if applicable:  GENERAL: alert, oriented, appears well and in no  acute distress  HEENT: atraumatic, conjunttiva clear, no obvious abnormalities on inspection of external nose and ears  NECK: normal movements of the head and neck  LUNGS: on inspection no signs of respiratory distress, breathing rate appears normal, no obvious gross SOB, gasping or wheezing  CV: no obvious cyanosis  MS: moves all visible extremities without noticeable abnormality  PSYCH/NEURO: pleasant and cooperative, no obvious depression or anxiety, speech and thought processing grossly intact  ASSESSMENT AND PLAN:  Discussed the following assessment and plan:  Hypothyroidism, unspecified type - Plan: levothyroxine (SYNTHROID) 100 MCG tablet qam   Polycythemia, 2ndary  -repeat CBC and if elevated refer back to H/o   Senile dementia of Alzheimer's type (HCC)-f/u Dr. Francene Carpenter   B12 deficiency-taking oral B12 1000 mcg qd check B12  Sleep apnea on cpap tolerating and benefiting   Renovascular hypertension -cont BP meds monitor BP  -check BP with lab visit   HM utd flu, prevnar, pna 23 consider repeat at f/u shingrix had 2/2  Consider Tdap in future   Out of age window pap, mammogram (last 03/06/15 negative diag b/l pt does have h/o breast cancer), colonoscopy DEXA 11/06/17 osteopenia lumbar not reported 2/2 scoliosis or sclerosis   Dentist declines for now  Dermatology declines for now    I discussed the assessment and treatment plan with the patient. The patient was provided an opportunity to ask questions and all were answered. The patient agreed with the plan and demonstrated an understanding of the instructions.   The patient was advised to call back or seek an in-person evaluation if the symptoms worsen or if the condition fails to improve as anticipated.  Time spent 25 minutes  Delorise Jackson, MD

## 2018-07-03 ENCOUNTER — Encounter: Payer: Self-pay | Admitting: Internal Medicine

## 2018-07-03 NOTE — Patient Instructions (Signed)
Dementia Caregiver Guide Dementia is a term used to describe a number of symptoms that affect memory and thinking. The most common symptoms include:  Memory loss.  Trouble with language and communication.  Trouble concentrating.  Poor judgment.  Problems with reasoning.  Child-like behavior and language.  Extreme anxiety.  Angry outbursts.  Wandering from home or public places. Dementia usually gets worse slowly over time. In the early stages, people with dementia can stay independent and safe with some help. In later stages, they need help with daily tasks such as dressing, grooming, and using the bathroom. How to help the person with dementia cope Dementia can be frightening and confusing. Here are some tips to help the person with dementia cope with changes caused by the disease. General tips  Keep the person on track with his or her routine.  Try to identify areas where the person may need help.  Be supportive, patient, calm, and encouraging.  Gently remind the person that adjusting to changes takes time.  Help with the tasks that the person has asked for help with.  Keep the person involved in daily tasks and decisions as much as possible.  Encourage conversation, but try not to get frustrated or harried if the person struggles to find words or does not seem to appreciate your help. Communication tips  When the person is talking or seems frustrated, make eye contact and hold the person's hand.  Ask specific questions that need yes or no answers.  Use simple words, short sentences, and a calm voice. Only give one direction at a time.  When offering choices, limit them to just 1 or 2.  Avoid correcting the person in a negative way.  If the person is struggling to find the right words, gently try to help him or her. How to recognize symptoms of stress Symptoms of stress in caregivers include:  Feeling frustrated or angry with the person with dementia.   Denying that the person has dementia or that his or her symptoms will not improve.  Feeling hopeless and unappreciated.  Difficulty sleeping.  Difficulty concentrating.  Feeling anxious, irritable, or depressed.  Developing stress-related health problems.  Feeling like you have too little time for your own life. Follow these instructions at home:   Make sure that you and the person you are caring for: ? Get regular sleep. ? Exercise regularly. ? Eat regular, nutritious meals. ? Drink enough fluid to keep your urine clear or pale yellow. ? Take over-the-counter and prescription medicines only as told by your health care providers. ? Attend all scheduled health care appointments.  Join a support group with others who are caregivers.  Ask about respite care resources so that you can have a regular break from the stress of caregiving.  Look for signs of stress in yourself and in the person you are caring for. If you notice signs of stress, take steps to manage it.  Consider any safety risks and take steps to avoid them.  Organize medications in a pill box for each day of the week.  Create a plan to handle any legal or financial matters. Get legal or financial advice if needed.  Keep a calendar in a central location to remind the person of appointments or other activities. Tips for reducing the risk of injury  Keep floors clear of clutter. Remove rugs, magazine racks, and floor lamps.  Keep hallways well lit, especially at night.  Put a handrail and nonslip mat in the bathtub or   shower.  Put childproof locks on cabinets that contain dangerous items, such as medicines, alcohol, guns, toxic cleaning items, sharp tools or utensils, matches, and lighters.  Put the locks in places where the person cannot see or reach them easily. This will help ensure that the person does not wander out of the house and get lost.  Be prepared for emergencies. Keep a list of emergency phone  numbers and addresses in a convenient area.  Remove car keys and lock garage doors so that the person does not try to get in the car and drive.  Have the person wear a bracelet that tracks locations and identifies the person as having memory problems. This should be worn at all times for safety. Where to find support: Many individuals and organizations offer support. These include:  Support groups for people with dementia and for caregivers.  Counselors or therapists.  Home health care services.  Adult day care centers. Where to find more information Alzheimer's Association: www.alz.org Contact a health care provider if:  The person's health is rapidly getting worse.  You are no longer able to care for the person.  Caring for the person is affecting your physical and emotional health.  The person threatens himself or herself, you, or anyone else. Summary  Dementia is a term used to describe a number of symptoms that affect memory and thinking.  Dementia usually gets worse slowly over time.  Take steps to reduce the person's risk of injury, and to plan for future care.  Caregivers need support, relief from caregiving, and time for their own lives. This information is not intended to replace advice given to you by your health care provider. Make sure you discuss any questions you have with your health care provider. Document Released: 12/15/2015 Document Revised: 12/15/2015 Document Reviewed: 12/15/2015 Elsevier Interactive Patient Education  2019 Elsevier Inc.  

## 2018-07-10 ENCOUNTER — Other Ambulatory Visit (INDEPENDENT_AMBULATORY_CARE_PROVIDER_SITE_OTHER): Payer: Medicare Other

## 2018-07-10 ENCOUNTER — Other Ambulatory Visit: Payer: Self-pay | Admitting: Internal Medicine

## 2018-07-10 ENCOUNTER — Other Ambulatory Visit: Payer: Self-pay

## 2018-07-10 ENCOUNTER — Ambulatory Visit (INDEPENDENT_AMBULATORY_CARE_PROVIDER_SITE_OTHER): Payer: Medicare Other

## 2018-07-10 VITALS — BP 130/80 | HR 85

## 2018-07-10 DIAGNOSIS — N3000 Acute cystitis without hematuria: Secondary | ICD-10-CM

## 2018-07-10 DIAGNOSIS — I150 Renovascular hypertension: Secondary | ICD-10-CM | POA: Diagnosis not present

## 2018-07-10 DIAGNOSIS — Z1389 Encounter for screening for other disorder: Secondary | ICD-10-CM

## 2018-07-10 DIAGNOSIS — E559 Vitamin D deficiency, unspecified: Secondary | ICD-10-CM | POA: Diagnosis not present

## 2018-07-10 DIAGNOSIS — I10 Essential (primary) hypertension: Secondary | ICD-10-CM

## 2018-07-10 DIAGNOSIS — E538 Deficiency of other specified B group vitamins: Secondary | ICD-10-CM

## 2018-07-10 DIAGNOSIS — R739 Hyperglycemia, unspecified: Secondary | ICD-10-CM

## 2018-07-10 DIAGNOSIS — E039 Hypothyroidism, unspecified: Secondary | ICD-10-CM | POA: Diagnosis not present

## 2018-07-10 LAB — LIPID PANEL
Cholesterol: 270 mg/dL — ABNORMAL HIGH (ref 0–200)
HDL: 101.5 mg/dL (ref >39.00)
LDL Cholesterol: 150 mg/dL — ABNORMAL HIGH (ref 0–99)
NonHDL: 168.32
Total CHOL/HDL Ratio: 3
Triglycerides: 93 mg/dL (ref 0.0–149.0)
VLDL: 18.6 mg/dL (ref 0.0–40.0)

## 2018-07-10 LAB — COMPREHENSIVE METABOLIC PANEL
ALT: 25 U/L (ref 0–35)
AST: 24 U/L (ref 0–37)
Albumin: 5.2 g/dL (ref 3.5–5.2)
Alkaline Phosphatase: 80 U/L (ref 39–117)
BUN: 9 mg/dL (ref 6–23)
CO2: 24 mEq/L (ref 19–32)
Calcium: 10.6 mg/dL — ABNORMAL HIGH (ref 8.4–10.5)
Chloride: 96 mEq/L (ref 96–112)
Creatinine, Ser: 0.66 mg/dL (ref 0.40–1.20)
GFR: 85.94 mL/min (ref >60.00)
Glucose, Bld: 101 mg/dL — ABNORMAL HIGH (ref 70–99)
Potassium: 4.2 mEq/L (ref 3.5–5.1)
Sodium: 135 mEq/L (ref 135–145)
Total Bilirubin: 0.9 mg/dL (ref 0.2–1.2)
Total Protein: 7.8 g/dL (ref 6.0–8.3)

## 2018-07-10 LAB — VITAMIN D 25 HYDROXY (VIT D DEFICIENCY, FRACTURES): VITD: 24.58 ng/mL — ABNORMAL LOW (ref 30.00–100.00)

## 2018-07-10 LAB — CBC WITH DIFFERENTIAL/PLATELET
Basophils Absolute: 0.1 10*3/uL (ref 0.0–0.1)
Basophils Relative: 0.9 % (ref 0.0–3.0)
Eosinophils Absolute: 0.1 10*3/uL (ref 0.0–0.7)
Eosinophils Relative: 1.1 % (ref 0.0–5.0)
HCT: 53.2 % — ABNORMAL HIGH (ref 36.0–46.0)
Hemoglobin: 17.9 g/dL — ABNORMAL HIGH (ref 12.0–15.0)
Lymphocytes Relative: 33.1 % (ref 12.0–46.0)
Lymphs Abs: 2.1 10*3/uL (ref 0.7–4.0)
MCHC: 33.6 g/dL (ref 30.0–36.0)
MCV: 98.2 fl (ref 78.0–100.0)
Monocytes Absolute: 0.6 10*3/uL (ref 0.1–1.0)
Monocytes Relative: 9 % (ref 3.0–12.0)
Neutro Abs: 3.5 10*3/uL (ref 1.4–7.7)
Neutrophils Relative %: 55.9 % (ref 43.0–77.0)
Platelets: 139 10*3/uL — ABNORMAL LOW (ref 150.0–400.0)
RBC: 5.42 Mil/uL — ABNORMAL HIGH (ref 3.87–5.11)
RDW: 13.6 % (ref 11.5–15.5)
WBC: 6.3 10*3/uL (ref 4.0–10.5)

## 2018-07-10 LAB — TSH: TSH: 2.86 u[IU]/mL (ref 0.35–4.50)

## 2018-07-10 LAB — VITAMIN B12: Vitamin B-12: >1500 pg/mL — ABNORMAL HIGH (ref 211–911)

## 2018-07-10 NOTE — Progress Notes (Addendum)
Patient here for nurse visit BP check per order from [facility].   Patient reports compliance with prescribed BP medications: yes  Last dose of BP medication: yesterday  BP Readings from Last 3 Encounters:  07/10/18 130/80  06/29/15 101/68  02/09/15 105/73   Pulse Readings from Last 3 Encounters:  07/10/18 85  06/29/15 74  02/09/15 93    Per Dr. Mclean-Scocuzzo; keep current regimen.    Patient verbalized understanding of instructions.   Gordy Councilman, CMA

## 2018-07-11 ENCOUNTER — Other Ambulatory Visit: Payer: Self-pay | Admitting: Internal Medicine

## 2018-07-11 DIAGNOSIS — N3000 Acute cystitis without hematuria: Secondary | ICD-10-CM

## 2018-07-11 LAB — URINALYSIS, ROUTINE W REFLEX MICROSCOPIC
Bilirubin Urine: NEGATIVE
Glucose, UA: NEGATIVE
Hgb urine dipstick: NEGATIVE
Nitrite: POSITIVE — AB
Protein, ur: NEGATIVE
Specific Gravity, Urine: 1.01 (ref 1.001–1.03)
pH: 7.5 (ref 5.0–8.0)

## 2018-07-11 NOTE — Addendum Note (Signed)
Addended by: Leeanne Rio on: 07/11/2018 09:06 AM   Modules accepted: Orders

## 2018-07-12 ENCOUNTER — Other Ambulatory Visit: Payer: Self-pay | Admitting: Internal Medicine

## 2018-07-12 DIAGNOSIS — E785 Hyperlipidemia, unspecified: Secondary | ICD-10-CM

## 2018-07-12 MED ORDER — ATORVASTATIN CALCIUM 10 MG PO TABS: 10.0000 mg | ORAL_TABLET | Freq: Every day | ORAL | 3 refills | Status: DC

## 2018-07-13 ENCOUNTER — Other Ambulatory Visit: Payer: Self-pay | Admitting: Internal Medicine

## 2018-07-13 ENCOUNTER — Telehealth: Payer: Self-pay | Admitting: Internal Medicine

## 2018-07-13 ENCOUNTER — Encounter: Payer: Self-pay | Admitting: Internal Medicine

## 2018-07-13 DIAGNOSIS — D751 Secondary polycythemia: Secondary | ICD-10-CM

## 2018-07-13 DIAGNOSIS — D696 Thrombocytopenia, unspecified: Secondary | ICD-10-CM

## 2018-07-13 DIAGNOSIS — N3000 Acute cystitis without hematuria: Secondary | ICD-10-CM

## 2018-07-13 LAB — URINE CULTURE
MICRO NUMBER:: 579159
SPECIMEN QUALITY:: ADEQUATE

## 2018-07-13 MED ORDER — CIPROFLOXACIN HCL 500 MG PO TABS: 500.0000 mg | ORAL_TABLET | Freq: Two times a day (BID) | ORAL | 0 refills | Status: DC

## 2018-07-13 NOTE — Telephone Encounter (Signed)
Pt needs to be scheduled for labs BMET, A1C and urine culture repeat by 07/24/2018  In result noted I asked this to be done and it was not   [place]

## 2018-07-16 ENCOUNTER — Telehealth: Payer: Self-pay | Admitting: Internal Medicine

## 2018-07-16 NOTE — Telephone Encounter (Signed)
Spoken to libby.

## 2018-07-16 NOTE — Telephone Encounter (Signed)
Lab appointment has been placed.

## 2018-07-16 NOTE — Telephone Encounter (Signed)
Patient's daughter, [name], requesting call back from PCP or CMA to discuss patients lab results from 06/17.

## 2018-07-24 ENCOUNTER — Other Ambulatory Visit: Payer: Self-pay | Admitting: Internal Medicine

## 2018-07-24 MED ORDER — AMLODIPINE BESY-BENAZEPRIL HCL 10-40 MG PO CAPS: 1.0000 | ORAL_CAPSULE | Freq: Every day | ORAL | 3 refills | Status: DC

## 2018-08-06 ENCOUNTER — Other Ambulatory Visit: Payer: Self-pay

## 2018-08-06 ENCOUNTER — Other Ambulatory Visit (INDEPENDENT_AMBULATORY_CARE_PROVIDER_SITE_OTHER): Payer: Medicare Other

## 2018-08-06 DIAGNOSIS — D696 Thrombocytopenia, unspecified: Secondary | ICD-10-CM | POA: Diagnosis not present

## 2018-08-06 DIAGNOSIS — D751 Secondary polycythemia: Secondary | ICD-10-CM

## 2018-08-06 DIAGNOSIS — N3000 Acute cystitis without hematuria: Secondary | ICD-10-CM

## 2018-08-06 DIAGNOSIS — R739 Hyperglycemia, unspecified: Secondary | ICD-10-CM | POA: Diagnosis not present

## 2018-08-06 LAB — CBC WITH DIFFERENTIAL/PLATELET
Basophils Absolute: 0 10*3/uL (ref 0.0–0.1)
Basophils Relative: 0.5 % (ref 0.0–3.0)
Eosinophils Absolute: 0.1 10*3/uL (ref 0.0–0.7)
Eosinophils Relative: 1.8 % (ref 0.0–5.0)
HCT: 45.7 % (ref 36.0–46.0)
Hemoglobin: 15.6 g/dL — ABNORMAL HIGH (ref 12.0–15.0)
Lymphocytes Relative: 36.9 % (ref 12.0–46.0)
Lymphs Abs: 1.9 10*3/uL (ref 0.7–4.0)
MCHC: 34.1 g/dL (ref 30.0–36.0)
MCV: 97.8 fl (ref 78.0–100.0)
Monocytes Absolute: 0.5 10*3/uL (ref 0.1–1.0)
Monocytes Relative: 9.1 % (ref 3.0–12.0)
Neutro Abs: 2.6 10*3/uL (ref 1.4–7.7)
Neutrophils Relative %: 51.7 % (ref 43.0–77.0)
Platelets: 226 10*3/uL (ref 150.0–400.0)
RBC: 4.67 Mil/uL (ref 3.87–5.11)
RDW: 13.4 % (ref 11.5–15.5)
WBC: 5.1 10*3/uL (ref 4.0–10.5)

## 2018-08-06 LAB — BASIC METABOLIC PANEL
BUN: 14 mg/dL (ref 6–23)
CO2: 27 mEq/L (ref 19–32)
Calcium: 10 mg/dL (ref 8.4–10.5)
Chloride: 97 mEq/L (ref 96–112)
Creatinine, Ser: 0.74 mg/dL (ref 0.40–1.20)
GFR: 75.3 mL/min (ref >60.00)
Glucose, Bld: 106 mg/dL — ABNORMAL HIGH (ref 70–99)
Potassium: 4.3 mEq/L (ref 3.5–5.1)
Sodium: 134 mEq/L — ABNORMAL LOW (ref 135–145)

## 2018-08-06 LAB — HEMOGLOBIN A1C: Hgb A1c MFr Bld: 5.4 % (ref 4.6–6.5)

## 2018-08-08 ENCOUNTER — Other Ambulatory Visit: Payer: Self-pay | Admitting: Internal Medicine

## 2018-08-08 DIAGNOSIS — N3000 Acute cystitis without hematuria: Secondary | ICD-10-CM

## 2018-08-08 LAB — URINE CULTURE
MICRO NUMBER:: 660179
SPECIMEN QUALITY:: ADEQUATE

## 2018-08-08 MED ORDER — AMOXICILLIN-POT CLAVULANATE 875-125 MG PO TABS: 1.0000 | ORAL_TABLET | Freq: Two times a day (BID) | ORAL | 0 refills | Status: DC

## 2018-10-03 ENCOUNTER — Ambulatory Visit: Payer: Medicare Other | Admitting: Internal Medicine

## 2018-10-10 ENCOUNTER — Ambulatory Visit (INDEPENDENT_AMBULATORY_CARE_PROVIDER_SITE_OTHER): Payer: Medicare Other | Admitting: Internal Medicine

## 2018-10-10 ENCOUNTER — Other Ambulatory Visit: Payer: Self-pay

## 2018-10-10 DIAGNOSIS — D751 Secondary polycythemia: Secondary | ICD-10-CM

## 2018-10-10 DIAGNOSIS — H9193 Unspecified hearing loss, bilateral: Secondary | ICD-10-CM

## 2018-10-10 DIAGNOSIS — C50911 Malignant neoplasm of unspecified site of right female breast: Secondary | ICD-10-CM

## 2018-10-10 DIAGNOSIS — N3000 Acute cystitis without hematuria: Secondary | ICD-10-CM

## 2018-10-10 DIAGNOSIS — E039 Hypothyroidism, unspecified: Secondary | ICD-10-CM

## 2018-10-10 DIAGNOSIS — E785 Hyperlipidemia, unspecified: Secondary | ICD-10-CM

## 2018-10-10 DIAGNOSIS — Z1231 Encounter for screening mammogram for malignant neoplasm of breast: Secondary | ICD-10-CM | POA: Diagnosis not present

## 2018-10-10 DIAGNOSIS — E559 Vitamin D deficiency, unspecified: Secondary | ICD-10-CM

## 2018-10-11 ENCOUNTER — Encounter: Payer: Self-pay | Admitting: Internal Medicine

## 2018-10-11 NOTE — Progress Notes (Signed)
Virtual Visit via Video Note  I connected with Laura Carpenter  on 10/11/18 at  3:30 PM EDT by a video enabled telemedicine application and verified that I am speaking with the correct person using two identifiers.  Location patient: home Location provider:work or home office Persons participating in the virtual visit: patient, provider, pts daughter [name] and husband   I discussed the limitations of evaluation and management by telemedicine and the availability of in person appointments. The patient expressed understanding and agreed to proceed.   HPI: 1. HLD on lipitor 10 mg qhs tolerating  2. H/o right breast cancer in 2012 agreeable to right breast mammogram  3. Dementia appt upcoming 10/12/18 with Dr. Francene Boyers memory care 4. Hearing loss they will call [facility] and have hearing aids adjusted last done 06/01/2018 she is having trouble hearing esp on the phone  5. H/o UTI on sxs currently   ROS: See pertinent positives and negatives per HPI.  Past Medical History:  Diagnosis Date  . Breast cancer ([place])    02/17/10: right lumpectomy reveals 0.8 cm of invasive ductal carcinoma with 1/3 LN+ for isolated tumor cell cluster in Oncotype recurrent score 19 or 12% chance of distant recurrence; Femara self-discontinued secondary to side effects. She continues to decline further therapy  . Chronic hyponatremia    Hyponatremia for which she has had since somewhere in the mid 1990's. She's had multiple hospitalization, one hospitalization in 1993, she was diagnosed with an EF of 25% as well, though coronaries were clean during that hospitalization. The cath showed an EF of 50%. She is being worked up for her hyponatremia at [facility].  . Dementia ([place])   . Dizziness   . 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 She subsequently underwent repeat angioplasty a few years later. 12/2009 Abdominal Duplex: <60% bilateral RAS, Sequential narrowing and dilations of renal  arteries, suggests fibromuscular dysplasia.   . H/O electrolyte imbalance   . Hearing loss   . Hemorrhoid   . History of radiation therapy 2011  . Hypertension   . Hypothyroidism   . Ingrowing nail, left great toe   . Otalgia of right ear 11/10/2011  . Polycythemia   . Skin cancer    squamous cell skin cancer removed by Dermatology--arms and hands  . Sleep apnea   . UTI (urinary tract infection)     Past Surgical History:  Procedure Laterality Date  . ANGIOPLASTY OF RIGHT RENAL ARTERY  05/04/1995  . APPENDECTOMY    . BREAST LUMPECTOMY Right 2012   performed [facility]  . core biopsy breast Right 2012   ultrasound guided core needle biopsy of the right breast reveals invasive ductal carcinoma, ER/PR positive, HER2 neu   . FRACTURE SURGERY  2010   right    Family History  Problem Relation Age of Onset  . Hypertension Mother   . Cancer Mother        ? type lived 62   . Alcohol abuse Father        died 51   . Cirrhosis Father   . Hypertension Daughter        [name]  . Cirrhosis Brother        a lot of health problems died 23s-70s   . Hyperlipidemia Son     SOCIAL HX:  Married x 39 years as of 09/2018  3 kids 1 daughter and 2 sons 5 grands  Homemaker  DPR 1. Husband John [phone])  [phone]; [phone] 2. Libby [phone]    Current Outpatient Medications:  .  Cholecalciferol (VITAMIN D3) 50 MCG (2000 UT) TABS, Take 4,000 Units by mouth daily., Disp: , Rfl:  .  amLODipine-benazepril (LOTREL) 10-40 MG capsule, Take 1 capsule by mouth daily., Disp: 90 capsule, Rfl: 3 .  aspirin 81 MG tablet, Take 81 mg by mouth daily., Disp: , Rfl:  .  atorvastatin (LIPITOR) 10 MG tablet, Take 1 tablet (10 mg total) by mouth daily at 6 PM., Disp: 90 tablet, Rfl: 3 .  ciprofloxacin (CIPRO) 500 MG tablet, Take 1 tablet (500 mg total) by mouth 2 (two) times daily. With food, Disp: 10 tablet, Rfl: 0 .  levothyroxine (SYNTHROID) 100 MCG tablet, Take 1 tablet (100 mcg total) by mouth daily  before breakfast., Disp: 90 tablet, Rfl: 3 .  rivastigmine (EXELON) 9.5 mg/24hr, , Disp: , Rfl:  .  vitamin B-12 (CYANOCOBALAMIN) 1000 MCG tablet, Take by mouth., Disp: , Rfl:   EXAM:  VITALS per patient if applicable:  GENERAL: alert, oriented, appears well and in no acute distress  HEENT: atraumatic, conjunttiva clear, no obvious abnormalities on inspection of external nose and ears  NECK: normal movements of the head and neck  LUNGS: on inspection no signs of respiratory distress, breathing rate appears normal, no obvious gross SOB, gasping or wheezing  CV: no obvious cyanosis  MS: moves all visible extremities without noticeable abnormality  PSYCH/NEURO: pleasant and cooperative, no obvious depression or anxiety, speech and thought processing grossly intact  ASSESSMENT AND PLAN:  Discussed the following assessment and plan:  Malignant neoplasm of right female breast, unspecified estrogen receptor status, unspecified site of breast (HCC)  Screening mammogram, encounter for - Plan: MM 3D SCREEN BREAST BILATERAL  Hyperlipidemia, unspecified hyperlipidemia type  Hypothyroidism, unspecified type  Bilateral hearing loss, unspecified hearing loss type  Acute cystitis without hematuria HM utd flu last week total care, prevnar, pna 23 consider repeat at f/u shingrix had 2/2  Consider Tdap in future   Out of age window pap, mammogram (last 03/06/15 negative diag b/l pt does have h/o breast cancer), colonoscopy DEXA 11/06/17 osteopenia lumbar not reported 2/2 scoliosis or sclerosis  -rec calcium 600 mg qd and D3 4000 IU daily  Dentist declines for now  Dermatology declines for now   -we discussed possible serious and likely etiologies, options for evaluation and workup, limitations of telemedicine visit vs in person visit, treatment, treatment risks and precautions. Pt prefers to treat via telemedicine empirically rather then risking or undertaking an in person visit at  this moment. Patient agrees to seek prompt in person care if worsening, new symptoms arise, or if is not improving with treatment.   I discussed the assessment and treatment plan with the patient. The patient was provided an opportunity to ask questions and all were answered. The patient agreed with the plan and demonstrated an understanding of the instructions.   The patient was advised to call back or seek an in-person evaluation if the symptoms worsen or if the condition fails to improve as anticipated.  Time spent 25 minutes  Delorise Jackson, MD

## 2018-10-25 ENCOUNTER — Encounter: Payer: Self-pay | Admitting: Internal Medicine

## 2018-11-09 ENCOUNTER — Other Ambulatory Visit: Payer: Self-pay

## 2018-11-09 DIAGNOSIS — Z20822 Contact with and (suspected) exposure to covid-19: Secondary | ICD-10-CM

## 2018-11-10 LAB — NOVEL CORONAVIRUS, NAA: SARS-CoV-2, NAA: NOT DETECTED

## 2019-01-03 ENCOUNTER — Inpatient Hospital Stay
Admission: RE | Admit: 2019-01-03 | Discharge: 2019-01-03 | Disposition: A | Discharge status: Home / Self Care | Payer: Self-pay | Source: Ambulatory Visit | Attending: *Deleted | Admitting: *Deleted

## 2019-01-03 ENCOUNTER — Other Ambulatory Visit: Payer: Self-pay | Admitting: *Deleted

## 2019-01-03 DIAGNOSIS — Z1231 Encounter for screening mammogram for malignant neoplasm of breast: Secondary | ICD-10-CM

## 2019-01-04 ENCOUNTER — Ambulatory Visit
Admission: RE | Admit: 2019-01-04 | Discharge: 2019-01-04 | Disposition: A | Discharge status: Home / Self Care | Payer: Medicare Other | Source: Ambulatory Visit | Attending: Internal Medicine | Admitting: Internal Medicine

## 2019-01-04 DIAGNOSIS — Z1231 Encounter for screening mammogram for malignant neoplasm of breast: Secondary | ICD-10-CM | POA: Insufficient documentation

## 2019-01-10 ENCOUNTER — Other Ambulatory Visit (INDEPENDENT_AMBULATORY_CARE_PROVIDER_SITE_OTHER): Payer: Medicare Other

## 2019-01-10 ENCOUNTER — Other Ambulatory Visit: Payer: Self-pay

## 2019-01-10 DIAGNOSIS — E039 Hypothyroidism, unspecified: Secondary | ICD-10-CM | POA: Diagnosis not present

## 2019-01-10 DIAGNOSIS — E559 Vitamin D deficiency, unspecified: Secondary | ICD-10-CM

## 2019-01-10 DIAGNOSIS — N3000 Acute cystitis without hematuria: Secondary | ICD-10-CM

## 2019-01-10 DIAGNOSIS — D751 Secondary polycythemia: Secondary | ICD-10-CM | POA: Diagnosis not present

## 2019-01-10 DIAGNOSIS — E785 Hyperlipidemia, unspecified: Secondary | ICD-10-CM

## 2019-01-10 LAB — CBC WITH DIFFERENTIAL/PLATELET
Basophils Absolute: 0 10*3/uL (ref 0.0–0.1)
Basophils Relative: 0.4 % (ref 0.0–3.0)
Eosinophils Absolute: 0.1 10*3/uL (ref 0.0–0.7)
Eosinophils Relative: 2.5 % (ref 0.0–5.0)
HCT: 45.2 % (ref 36.0–46.0)
Hemoglobin: 15.4 g/dL — ABNORMAL HIGH (ref 12.0–15.0)
Lymphocytes Relative: 39.3 % (ref 12.0–46.0)
Lymphs Abs: 2.3 10*3/uL (ref 0.7–4.0)
MCHC: 34 g/dL (ref 30.0–36.0)
MCV: 97.6 fl (ref 78.0–100.0)
Monocytes Absolute: 0.4 10*3/uL (ref 0.1–1.0)
Monocytes Relative: 7.3 % (ref 3.0–12.0)
Neutro Abs: 2.9 10*3/uL (ref 1.4–7.7)
Neutrophils Relative %: 50.5 % (ref 43.0–77.0)
Platelets: 223 10*3/uL (ref 150.0–400.0)
RBC: 4.63 Mil/uL (ref 3.87–5.11)
RDW: 13.6 % (ref 11.5–15.5)
WBC: 5.7 10*3/uL (ref 4.0–10.5)

## 2019-01-10 LAB — COMPREHENSIVE METABOLIC PANEL
ALT: 33 U/L (ref 0–35)
AST: 32 U/L (ref 0–37)
Albumin: 4.8 g/dL (ref 3.5–5.2)
Alkaline Phosphatase: 69 U/L (ref 39–117)
BUN: 21 mg/dL (ref 6–23)
CO2: 26 mEq/L (ref 19–32)
Calcium: 10.1 mg/dL (ref 8.4–10.5)
Chloride: 99 mEq/L (ref 96–112)
Creatinine, Ser: 0.77 mg/dL (ref 0.40–1.20)
GFR: 71.85 mL/min (ref >60.00)
Glucose, Bld: 94 mg/dL (ref 70–99)
Potassium: 4.4 mEq/L (ref 3.5–5.1)
Sodium: 134 mEq/L — ABNORMAL LOW (ref 135–145)
Total Bilirubin: 0.8 mg/dL (ref 0.2–1.2)
Total Protein: 7.7 g/dL (ref 6.0–8.3)

## 2019-01-10 LAB — LIPID PANEL
Cholesterol: 216 mg/dL — ABNORMAL HIGH (ref 0–200)
HDL: 101.6 mg/dL (ref >39.00)
LDL Cholesterol: 101 mg/dL — ABNORMAL HIGH (ref 0–99)
NonHDL: 113.93
Total CHOL/HDL Ratio: 2
Triglycerides: 64 mg/dL (ref 0.0–149.0)
VLDL: 12.8 mg/dL (ref 0.0–40.0)

## 2019-01-10 LAB — VITAMIN D 25 HYDROXY (VIT D DEFICIENCY, FRACTURES): VITD: 38.32 ng/mL (ref 30.00–100.00)

## 2019-01-10 LAB — TSH: TSH: 6.93 u[IU]/mL — ABNORMAL HIGH (ref 0.35–4.50)

## 2019-01-11 LAB — URINE CULTURE
MICRO NUMBER:: 1208340
SPECIMEN QUALITY:: ADEQUATE

## 2019-01-11 LAB — URINALYSIS, ROUTINE W REFLEX MICROSCOPIC
Bacteria, UA: NONE SEEN /HPF
Bilirubin Urine: NEGATIVE
Glucose, UA: NEGATIVE
Hgb urine dipstick: NEGATIVE
Hyaline Cast: NONE SEEN /LPF
Ketones, ur: NEGATIVE
Nitrite: NEGATIVE
Protein, ur: NEGATIVE
RBC / HPF: NONE SEEN /HPF (ref 0–2)
Specific Gravity, Urine: 1.019 (ref 1.001–1.03)
pH: 5.5 (ref 5.0–8.0)

## 2019-01-28 ENCOUNTER — Encounter: Payer: Self-pay | Admitting: Internal Medicine

## 2019-04-10 ENCOUNTER — Ambulatory Visit: Payer: Medicare Other | Admitting: Internal Medicine

## 2019-04-16 ENCOUNTER — Telehealth: Payer: Self-pay | Admitting: Internal Medicine

## 2019-04-16 DIAGNOSIS — E039 Hypothyroidism, unspecified: Secondary | ICD-10-CM

## 2019-04-16 MED ORDER — LEVOTHYROXINE SODIUM 100 MCG PO TABS: 100.0000 ug | ORAL_TABLET | Freq: Every day | ORAL | 1 refills | Status: DC

## 2019-04-16 NOTE — Telephone Encounter (Signed)
Medication sent in to preferred pharmacy per protocol.   

## 2019-04-19 ENCOUNTER — Other Ambulatory Visit: Payer: Self-pay | Admitting: Internal Medicine

## 2019-04-19 DIAGNOSIS — E785 Hyperlipidemia, unspecified: Secondary | ICD-10-CM

## 2019-04-19 MED ORDER — AMLODIPINE BESY-BENAZEPRIL HCL 10-40 MG PO CAPS: 1.0000 | ORAL_CAPSULE | Freq: Every day | ORAL | 3 refills | Status: DC

## 2019-04-19 MED ORDER — ATORVASTATIN CALCIUM 10 MG PO TABS: 10.0000 mg | ORAL_TABLET | Freq: Every day | ORAL | 3 refills | Status: DC

## 2019-05-06 ENCOUNTER — Other Ambulatory Visit: Payer: Self-pay

## 2019-05-08 ENCOUNTER — Encounter: Payer: Self-pay | Admitting: Internal Medicine

## 2019-05-08 ENCOUNTER — Ambulatory Visit (INDEPENDENT_AMBULATORY_CARE_PROVIDER_SITE_OTHER): Payer: Medicare Other | Admitting: Internal Medicine

## 2019-05-08 ENCOUNTER — Other Ambulatory Visit: Payer: Self-pay

## 2019-05-08 VITALS — BP 126/86 | HR 67 | Temp 97.0°F | Ht 66.5 in | Wt 134.4 lb

## 2019-05-08 DIAGNOSIS — I10 Essential (primary) hypertension: Secondary | ICD-10-CM | POA: Diagnosis not present

## 2019-05-08 DIAGNOSIS — N3000 Acute cystitis without hematuria: Secondary | ICD-10-CM | POA: Diagnosis not present

## 2019-05-08 DIAGNOSIS — E039 Hypothyroidism, unspecified: Secondary | ICD-10-CM

## 2019-05-08 DIAGNOSIS — Z1283 Encounter for screening for malignant neoplasm of skin: Secondary | ICD-10-CM

## 2019-05-08 DIAGNOSIS — L570 Actinic keratosis: Secondary | ICD-10-CM

## 2019-05-08 HISTORY — DX: Actinic keratosis: L57.0

## 2019-05-08 LAB — COMPREHENSIVE METABOLIC PANEL
ALT: 26 U/L (ref 0–35)
AST: 28 U/L (ref 0–37)
Albumin: 5.1 g/dL (ref 3.5–5.2)
Alkaline Phosphatase: 73 U/L (ref 39–117)
BUN: 14 mg/dL (ref 6–23)
CO2: 27 mEq/L (ref 19–32)
Calcium: 10.4 mg/dL (ref 8.4–10.5)
Chloride: 96 mEq/L (ref 96–112)
Creatinine, Ser: 0.75 mg/dL (ref 0.40–1.20)
GFR: 74 mL/min (ref >60.00)
Glucose, Bld: 101 mg/dL — ABNORMAL HIGH (ref 70–99)
Potassium: 4 mEq/L (ref 3.5–5.1)
Sodium: 133 mEq/L — ABNORMAL LOW (ref 135–145)
Total Bilirubin: 1 mg/dL (ref 0.2–1.2)
Total Protein: 8 g/dL (ref 6.0–8.3)

## 2019-05-08 LAB — CBC WITH DIFFERENTIAL/PLATELET
Basophils Absolute: 0 10*3/uL (ref 0.0–0.1)
Basophils Relative: 0.5 % (ref 0.0–3.0)
Eosinophils Absolute: 0.1 10*3/uL (ref 0.0–0.7)
Eosinophils Relative: 1.3 % (ref 0.0–5.0)
HCT: 47.9 % — ABNORMAL HIGH (ref 36.0–46.0)
Hemoglobin: 16.3 g/dL — ABNORMAL HIGH (ref 12.0–15.0)
Lymphocytes Relative: 30.6 % (ref 12.0–46.0)
Lymphs Abs: 2 10*3/uL (ref 0.7–4.0)
MCHC: 34 g/dL (ref 30.0–36.0)
MCV: 98.8 fl (ref 78.0–100.0)
Monocytes Absolute: 0.6 10*3/uL (ref 0.1–1.0)
Monocytes Relative: 9.1 % (ref 3.0–12.0)
Neutro Abs: 3.8 10*3/uL (ref 1.4–7.7)
Neutrophils Relative %: 58.5 % (ref 43.0–77.0)
Platelets: 228 10*3/uL (ref 150.0–400.0)
RBC: 4.85 Mil/uL (ref 3.87–5.11)
RDW: 13.9 % (ref 11.5–15.5)
WBC: 6.4 10*3/uL (ref 4.0–10.5)

## 2019-05-08 LAB — TSH: TSH: 7.81 u[IU]/mL — ABNORMAL HIGH (ref 0.35–4.50)

## 2019-05-08 NOTE — Progress Notes (Signed)
Chief Complaint  Patient presents with  . Follow-up   F/u 1. HTN repeat BP 126/86 lotrel 10-40 mg qd  2. Hypothyroidism on synthroid 100 mcg daily recheck tsh today 3. hld on lipitor 10 mg and improved  No complaints today   Review of Systems  Constitutional: Negative for weight loss.  HENT: Negative for hearing loss.   Eyes: Negative for blurred vision.  Respiratory: Negative for shortness of breath.   Cardiovascular: Negative for chest pain.  Gastrointestinal: Negative for abdominal pain.  Musculoskeletal: Negative for falls.  Skin: Negative for rash.  Neurological: Negative for headaches.  Psychiatric/Behavioral: Positive for memory loss.   Past Medical History:  Diagnosis Date  . Breast cancer ([place])    02/17/10: right lumpectomy reveals 0.8 cm of invasive ductal carcinoma with 1/3 LN+ for isolated tumor cell cluster in Oncotype recurrent score 19 or 12% chance of distant recurrence; Femara self-discontinued secondary to side effects. She continues to decline further therapy  . Chronic hyponatremia    Hyponatremia for which she has had since somewhere in the mid 1990's. She's had multiple hospitalization, one hospitalization in 1993, she was diagnosed with an EF of 25% as well, though coronaries were clean during that hospitalization. The cath showed an EF of 50%. She is being worked up for her hyponatremia at [facility].  . Dementia ([place])   . Dizziness   . Fibromuscular dysplasia of renal artery ([place])    1997 Renal angioplasty at [facility]. She subsequently underwent repeat angioplasty a few years later. 12/2009 Abdominal Duplex: <60% bilateral RAS, Sequential narrowing and dilations of renal arteries, suggests fibromuscular dysplasia.   . H/O electrolyte imbalance   . Hearing loss   . Hemorrhoid   . History of radiation therapy 2011  . Hypertension   . Hypothyroidism   . Ingrowing nail, left great toe   . Otalgia of right ear 11/10/2011  . Polycythemia   . Skin cancer    squamous  cell skin cancer removed by Dermatology--arms and hands  . Sleep apnea   . UTI (urinary tract infection)    Past Surgical History:  Procedure Laterality Date  . ANGIOPLASTY OF RIGHT RENAL ARTERY  05/04/1995  . APPENDECTOMY    . BREAST LUMPECTOMY Right 2012   performed [facility]   . core biopsy breast Right 2012   ultrasound guided core needle biopsy of the right breast reveals invasive ductal carcinoma, ER/PR positive, HER2 neu   . FRACTURE SURGERY  2010   right   Family History  Problem Relation Age of Onset  . Hypertension Mother   . Cancer Mother        ? type lived 33   . Alcohol abuse Father        died 58   . Cirrhosis Father   . Hypertension Daughter        [name]  . Cirrhosis Brother        a lot of health problems died 66s-70s   . Hyperlipidemia Son    Social History   Socioeconomic History  . Marital status: Married    Spouse name: Not on file  . Number of children: Not on file  . Years of education: Not on file  . Highest education level: Not on file  Occupational History  . Not on file  Tobacco Use  . Smoking status: Former Smoker    Packs/day: 0.25    Years: 20.00    Pack years: 5.00    Types: Cigarettes  .  Smokeless tobacco: Former User    Quit date: 05/30/1976  . Tobacco comment: early teens; early college smoking Quit: 05/30/1976  Substance and Sexual Activity  . Alcohol use: Yes    Alcohol/week: 0.0 standard drinks    Comment: occasional social drinker  . Drug use: No  . Sexual activity: Yes    Partners: Male  Other Topics Concern  . Not on file  Social History Narrative   Married x 59 years as of 09/2018    3 kids 1 daughter and 2 sons 5 grands    Homemaker    DPR   1. Husband John [phone]; [phone]   2. Libby [phone]    Social Determinants of Health   Financial Resource Strain:   . Difficulty of Paying Living Expenses:   Food Insecurity:   . Worried About Running Out of Food in the Last Year:   . Ran Out of  Food in the Last Year:   Transportation Needs:   . Lack of Transportation (Medical):   . Lack of Transportation (Non-Medical):   Physical Activity:   . Days of Exercise per Week:   . Minutes of Exercise per Session:   Stress:   . Feeling of Stress :   Social Connections:   . Frequency of Communication with Friends and Family:   . Frequency of Social Gatherings with Friends and Family:   . Attends Religious Services:   . Active Member of Clubs or Organizations:   . Attends Club or Organization Meetings:   . Marital Status:   Intimate Partner Violence:   . Fear of Current or Ex-Partner:   . Emotionally Abused:   . Physically Abused:   . Sexually Abused:    Current Meds  Medication Sig  . amLODipine-benazepril (LOTREL) 10-40 MG capsule Take 1 capsule by mouth daily.  . aspirin 81 MG tablet Take 81 mg by mouth daily.  . atorvastatin (LIPITOR) 10 MG tablet Take 1 tablet (10 mg total) by mouth daily at 6 PM.  . Cholecalciferol (VITAMIN D3) 50 MCG (2000 UT) TABS Take 4,000 Units by mouth daily.  . levothyroxine (SYNTHROID) 100 MCG tablet Take 1 tablet (100 mcg total) by mouth daily before breakfast.  . rivastigmine (EXELON) 9.5 mg/24hr   . vitamin B-12 (CYANOCOBALAMIN) 1000 MCG tablet Take by mouth.   Allergies  Allergen Reactions  . Aricept [Donepezil Hcl]     GI upset    No results found for this or any previous visit (from the past 2160 hour(s)). Objective  Body mass index is 21.37 kg/m. Wt Readings from Last 3 Encounters:  05/08/19 134 lb 6.4 oz (61 kg)  06/29/15 116 lb 2.9 oz (52.7 kg)  02/06/15 119 lb 11.4 oz (54.3 kg)   Temp Readings from Last 3 Encounters:  05/08/19 (!) 97 F (36.1 C) (Temporal)  06/29/15 98.9 F (37.2 C) (Tympanic)  02/09/15 (!) 96.7 F (35.9 C)   BP Readings from Last 3 Encounters:  05/08/19 126/86  07/10/18 130/80  06/29/15 101/68   Pulse Readings from Last 3 Encounters:  05/08/19 67  07/10/18 85  06/29/15 74    Physical  Exam Vitals and nursing note reviewed.  Constitutional:      Appearance: Normal appearance. She is well-developed and well-groomed.  HENT:     Head: Normocephalic and atraumatic.  Eyes:     Conjunctiva/sclera: Conjunctivae normal.     Pupils: Pupils are equal, round, and reactive to light.  Cardiovascular:       Rate and Rhythm: Normal rate and regular rhythm.     Heart sounds: Normal heart sounds. No murmur.  Pulmonary:     Effort: Pulmonary effort is normal.     Breath sounds: Normal breath sounds.  Skin:    General: Skin is warm and dry.     Comments: aks to hands and arms   Neurological:     General: No focal deficit present.     Mental Status: She is alert and oriented to person, place, and time. Mental status is at baseline.     Gait: Gait normal.  Psychiatric:        Attention and Perception: Attention and perception normal.        Mood and Affect: Mood and affect normal.        Speech: Speech normal.        Behavior: Behavior normal. Behavior is cooperative.        Thought Content: Thought content normal.        Cognition and Memory: Cognition and memory normal.        Judgment: Judgment normal.     Assessment  Plan  Essential hypertension - Plan: Comprehensive metabolic panel, CBC with Differential/Platelet Cont meds   Acute cystitis without hematuria - Plan: Urinalysis, Routine w reflex microscopic, Urine Culture  Hypothyroidism, unspecified type - Plan: TSH Cont meds   Actinic keratosis  rec derm pt declines   HM utd flu last week total care, prevnar, pna 23 consider repeat at f/u shingrix had 2/2  covid vx 2/2  Consider Tdap in future   Out of age window pap, mammogram (last 03/06/15 negative diag b/l pt does have h/o breast cancer), colonoscopy 01/03/2019 mammo normal DEXA 11/06/17 osteopenia lumbar not reported 2/2 scoliosis or sclerosis  -rec calcium 600 mg qd and D3 4000 IU daily  Dentist declines for now  Dermatology declines for  now  Provider: Dr.  McLean-Scocuzza-[facility]  

## 2019-05-08 NOTE — Patient Instructions (Addendum)
Blood pressure goal <130/<80   Monitor your blood   Watch fried food intake  Ground beef lean 93/7 best  55 to 64 ounces water daily  I want you to see dermatology on [place] [address] to hands abnormal changes and arms  F/u in 6 months am appt fasting   Actinic Keratosis An actinic keratosis is a precancerous growth on the skin. If there is more than one growth, the condition is called actinic keratoses. Actinic keratoses appear most often on areas of skin that get a lot of sun exposure, including the scalp, face, ears, lips, upper back, forearms, and the backs of the hands. If left untreated, these growths may develop into a skin cancer called squamous cell carcinoma. It is important to have all these growths checked by a health care provider to determine the best treatment approach. What are the causes? Actinic keratoses are caused by getting too much ultraviolet (UV) radiation from the sun or other UV light sources. What increases the risk? You are more likely to develop this condition if you:  Have light-colored skin and blue eyes.  Have blond or red hair.  Spend a lot of time in the sun.  Do not protect your skin from the sun when outdoors.  Are an older person. The risk of developing an actinic keratosis increases with age. What are the signs or symptoms? Actinic keratoses feel like scaly, rough spots of skin. Symptoms of this condition include growths that may:  Be as small as a pinhead or as big as a quarter.  Itch, hurt, or feel sensitive.  Be skin-colored, light tan, dark tan, pink, or a combination of any of these colors. In most cases, the growths become red.  Have a small piece of pink or gray skin (skin tag) growing from them. It may be easier to notice actinic keratoses by feeling them, rather than seeing them. Sometimes, actinic keratoses disappear, but many reappear a few days to a few weeks later. How is this diagnosed? This condition is usually diagnosed with a  physical exam.  A tissue sample may be removed from the actinic keratosis and examined under a microscope (biopsy). How is this treated? If needed, this condition may be treated by:  Scraping off the actinic keratosis (curettage).  Freezing the actinic keratosis with liquid nitrogen (cryosurgery). This causes the growth to eventually fall off the skin.  Applying medicated creams or gels to destroy the cells in the growth.  Applying chemicals to the actinic keratosis to make the outer layers of skin peel off (chemical peel).  Using photodynamic therapy. In this procedure, medicated cream is applied to the actinic keratosis. This cream increases your skin's sensitivity to light. Then, a strong light is aimed at the actinic keratosis to destroy cells in the growth. Follow these instructions at home: Skin care  Apply cool, wet cloths (cool compresses) to the affected areas.  Do not scratch your skin.  Check your skin regularly for any growths, especially growths that: ? Start to itch or bleed. ? Change in size, shape, or color. Caring for the treated area  Keep the treated area clean and dry as told by your health care provider.  Do not apply any medicine, cream, or lotion to the treated area unless your health care provider tells you to do that.  Do not pick at blisters or try to break them open. This can cause infection and scarring.  If you have red or irritated skin after treatment, follow instructions  from your health care provider about how to take care of the treated area. Make sure you: ? Wash your hands with soap and water before you change your bandage (dressing). If soap and water are not available, use hand sanitizer. ? Change your dressing as told by your health care provider.  If you have red or irritated skin after treatment, check your treated area every day for signs of infection. Check for: ? Redness, swelling, or pain. ? Fluid or blood. ? Warmth. ? Pus or a bad  smell. General instructions  Take or apply over-the-counter and prescription medicines only as told by your health care provider.  Return to your normal activities as told by your health care provider. Ask your health care provider what activities are safe for you.  Have a skin exam done every year by a health care provider who is a skin specialist (dermatologist).  Keep all follow-up visits as told by your health care provider. This is important. Lifestyle  Do not use any products that contain nicotine or tobacco, such as cigarettes and e-cigarettes. If you need help quitting, ask your health care provider.  Take steps to protect your skin from the sun. ? Try to avoid the sun between 10:00 a.m. and 4:00 p.m. This is when the UV light is the strongest. ? Use a sunscreen or sunblock with SPF 30 (sun protection factor 30) or greater. ? Apply sunscreen before you are exposed to sunlight and reapply as often as directed by the instructions on the sunscreen container. ? Always wear sunglasses that have UV protection, and always wear a hat and clothing to protect your skin from sunlight. ? When possible, avoid medicines that increase your sensitivity to sunlight. ? Do not use tanning beds or other indoor tanning devices. Contact a health care provider if:  You notice any changes or new growths on your skin.  You have swelling, pain, or more redness around your treated area.  You have fluid or blood coming from your treated area.  Your treated area feels warm to the touch.  You have pus or a bad smell coming from your treated area.  You have a fever.  You have a blister that becomes large and painful. Summary  An actinic keratosis is a precancerous growth on the skin. If there is more than one growth, the condition is called actinic keratoses. In some cases, if left untreated, these growths can develop into skin cancer.  Check your skin regularly for any growths, especially growths  that start to itch or bleed, or change in size, shape, or color.  Take steps to protect your skin from the sun.  Contact a health care provider if you notice any changes or new growths on your skin.  Keep all follow-up visits as told by your health care provider. This is important. This information is not intended to replace advice given to you by your health care provider. Make sure you discuss any questions you have with your health care provider. Document Revised: 05/23/2017 Document Reviewed: 05/23/2017 Elsevier Patient Education  [place].    High Cholesterol  High cholesterol is a condition in which the blood has high levels of a white, waxy, fat-like substance (cholesterol). The human body needs small amounts of cholesterol. The liver makes all the cholesterol that the body needs. Extra (excess) cholesterol comes from the food that we eat. Cholesterol is carried from the liver by the blood through the blood vessels. If you have high cholesterol,  deposits (plaques) may build up on the walls of your blood vessels (arteries). Plaques make the arteries narrower and stiffer. Cholesterol plaques increase your risk for heart attack and stroke. Work with your health care provider to keep your cholesterol levels in a healthy range. What increases the risk? This condition is more likely to develop in people who:  Eat foods that are high in animal fat (saturated fat) or cholesterol.  Are overweight.  Are not getting enough exercise.  Have a family history of high cholesterol. What are the signs or symptoms? There are no symptoms of this condition. How is this diagnosed? This condition may be diagnosed from the results of a blood test.  If you are older than age 51, your health care provider may check your cholesterol every 4-6 years.  You may be checked more often if you already have high cholesterol or other risk factors for heart disease. The blood test for cholesterol  measures:  "Bad" cholesterol (LDL cholesterol). This is the main type of cholesterol that causes heart disease. The desired level for LDL is less than 100.  "Good" cholesterol (HDL cholesterol). This type helps to protect against heart disease by cleaning the arteries and carrying the LDL away. The desired level for HDL is 60 or higher.  Triglycerides. These are fats that the body can store or burn for energy. The desired number for triglycerides is lower than 150.  Total cholesterol. This is a measure of the total amount of cholesterol in your blood, including LDL cholesterol, HDL cholesterol, and triglycerides. A healthy number is less than 200. How is this treated? This condition is treated with diet changes, lifestyle changes, and medicines. Diet changes  This may include eating more whole grains, fruits, vegetables, nuts, and fish.  This may also include cutting back on red meat and foods that have a lot of added sugar. Lifestyle changes  Changes may include getting at least 40 minutes of aerobic exercise 3 times a week. Aerobic exercises include walking, biking, and swimming. Aerobic exercise along with a healthy diet can help you maintain a healthy weight.  Changes may also include quitting smoking. Medicines  Medicines are usually given if diet and lifestyle changes have failed to reduce your cholesterol to healthy levels.  Your health care provider may prescribe a statin medicine. Statin medicines have been shown to reduce cholesterol, which can reduce the risk of heart disease. Follow these instructions at home: Eating and drinking If told by your health care provider:  Eat chicken (without skin), fish, veal, shellfish, ground [place] breast, and round or loin cuts of red meat.  Do not eat fried foods or fatty meats, such as hot dogs and salami.  Eat plenty of fruits, such as apples.  Eat plenty of vegetables, such as broccoli, potatoes, and carrots.  Eat beans, peas,  and lentils.  Eat grains such as barley, rice, couscous, and bulgur wheat.  Eat pasta without cream sauces.  Use skim or nonfat milk, and eat low-fat or nonfat yogurt and cheeses.  Do not eat or drink whole milk, cream, ice cream, egg yolks, or hard cheeses.  Do not eat stick margarine or tub margarines that contain trans fats (also called partially hydrogenated oils).  Do not eat saturated tropical oils, such as coconut oil and palm oil.  Do not eat cakes, cookies, crackers, or other baked goods that contain trans fats.  General instructions  Exercise as directed by your health care provider. Increase your activity level with  activities such as gardening, walking, and taking the stairs.  Take over-the-counter and prescription medicines only as told by your health care provider.  Do not use any products that contain nicotine or tobacco, such as cigarettes and e-cigarettes. If you need help quitting, ask your health care provider.  Keep all follow-up visits as told by your health care provider. This is important. Contact a health care provider if:  You are struggling to maintain a healthy diet or weight.  You need help to start on an exercise program.  You need help to stop smoking. Get help right away if:  You have chest pain.  You have trouble breathing. This information is not intended to replace advice given to you by your health care provider. Make sure you discuss any questions you have with your health care provider. Document Revised: 01/13/2017 Document Reviewed: 07/11/2015 Elsevier Patient Education  [place].  Cholesterol Content in Foods Cholesterol is a waxy, fat-like substance that helps to carry fat in the blood. The body needs cholesterol in small amounts, but too much cholesterol can cause damage to the arteries and heart. Most people should eat less than 200 milligrams (mg) of cholesterol a day. Foods with cholesterol  Cholesterol is found in  animal-based foods, such as meat, seafood, and dairy. Generally, low-fat dairy and lean meats have less cholesterol than full-fat dairy and fatty meats. The milligrams of cholesterol per serving (mg per serving) of common cholesterol-containing foods are listed below. Meat and other proteins  Egg - one large whole egg has 186 mg.  Veal shank - 4 oz has 141 mg.  Lean ground [place] (93% lean) - 4 oz has 118 mg.  Fat-trimmed lamb loin - 4 oz has 106 mg.  Lean ground beef (90% lean) - 4 oz has 100 mg.  Lobster - 3.5 oz has 90 mg.  Pork loin chops - 4 oz has 86 mg.  Canned salmon - 3.5 oz has 83 mg.  Fat-trimmed beef top loin - 4 oz has 78 mg.  Frankfurter - 1 frank (3.5 oz) has 77 mg.  Crab - 3.5 oz has 71 mg.  Roasted chicken without skin, white meat - 4 oz has 66 mg.  Light bologna - 2 oz has 45 mg.  Deli-cut [place] - 2 oz has 31 mg.  Canned tuna - 3.5 oz has 31 mg.  Bacon - 1 oz has 29 mg.  Oysters and mussels (raw) - 3.5 oz has 25 mg.  Mackerel - 1 oz has 22 mg.  Trout - 1 oz has 20 mg.  Pork sausage - 1 link (1 oz) has 17 mg.  Salmon - 1 oz has 16 mg.  Tilapia - 1 oz has 14 mg. Dairy  Soft-serve ice cream -  cup (4 oz) has 103 mg.  Whole-milk yogurt - 1 cup (8 oz) has 29 mg.  Cheddar cheese - 1 oz has 28 mg.  American cheese - 1 oz has 28 mg.  Whole milk - 1 cup (8 oz) has 23 mg.  2% milk - 1 cup (8 oz) has 18 mg.  Cream cheese - 1 tablespoon (Tbsp) has 15 mg.  Cottage cheese -  cup (4 oz) has 14 mg.  Low-fat (1%) milk - 1 cup (8 oz) has 10 mg.  Sour cream - 1 Tbsp has 8.5 mg.  Low-fat yogurt - 1 cup (8 oz) has 8 mg.  Nonfat Greek yogurt - 1 cup (8 oz) has 7 mg.  Half-and-half  cream - 1 Tbsp has 5 mg. Fats and oils  Cod liver oil - 1 tablespoon (Tbsp) has 82 mg.  Butter - 1 Tbsp has 15 mg.  Lard - 1 Tbsp has 14 mg.  Bacon grease - 1 Tbsp has 14 mg.  Mayonnaise - 1 Tbsp has 5-10 mg.  Margarine - 1 Tbsp has 3-10 mg. Exact amounts  of cholesterol in these foods may vary depending on specific ingredients and brands. Foods without cholesterol Most plant-based foods do not have cholesterol unless you combine them with a food that has cholesterol. Foods without cholesterol include:  Grains and cereals.  Vegetables.  Fruits.  Vegetable oils, such as olive, canola, and sunflower oil.  Legumes, such as peas, beans, and lentils.  Nuts and seeds.  Egg whites. Summary  The body needs cholesterol in small amounts, but too much cholesterol can cause damage to the arteries and heart.  Most people should eat less than 200 milligrams (mg) of cholesterol a day. This information is not intended to replace advice given to you by your health care provider. Make sure you discuss any questions you have with your health care provider. Document Revised: 12/23/2016 Document Reviewed: 09/06/2016 Elsevier Patient Education  2020 [employer].   Results for Laura Carpenter, Laura Carpenter (MRN [id number]) as of 05/08/2019 11:21  Ref. Range 01/10/2019 08:15  Cholesterol Latest Ref Range: 0 - 200 mg/dL 216 (H)  HDL Cholesterol Latest Ref Range: >39.00 mg/dL 101.60  LDL (calc) Latest Ref Range: 0 - 99 mg/dL 101 (H)  NonHDL Unknown 113.93  Triglycerides Latest Ref Range: 0.0 - 149.0 mg/dL 64.0  VLDL Latest Ref Range: 0.0 - 40.0 mg/dL 12.8  Results for Laura Carpenter, Laura Carpenter (MRN [id number]) as of 05/08/2019 11:25  Ref. Range 07/10/2018 10:40 01/10/2019 08:15  Cholesterol Latest Ref Range: 0 - 200 mg/dL 270 (H) 216 (H)  HDL Cholesterol Latest Ref Range: >39.00 mg/dL 101.50 101.60  LDL (calc) Latest Ref Range: 0 - 99 mg/dL 150 (H) 101 (H)  NonHDL Unknown 168.32 113.93  Triglycerides Latest Ref Range: 0.0 - 149.0 mg/dL 93.0 64.0  VLDL Latest Ref Range: 0.0 - 40.0 mg/dL 18.6 12.8

## 2019-05-09 ENCOUNTER — Telehealth: Payer: Self-pay | Admitting: Internal Medicine

## 2019-05-09 LAB — URINALYSIS, ROUTINE W REFLEX MICROSCOPIC
Bacteria, UA: NONE SEEN /HPF
Bilirubin Urine: NEGATIVE
Glucose, UA: NEGATIVE
Hgb urine dipstick: NEGATIVE
Hyaline Cast: NONE SEEN /LPF
Ketones, ur: NEGATIVE
Nitrite: NEGATIVE
Protein, ur: NEGATIVE
RBC / HPF: NONE SEEN /HPF (ref 0–2)
Specific Gravity, Urine: 1.01 (ref 1.001–1.03)
pH: 7 (ref 5.0–8.0)

## 2019-05-09 LAB — URINE CULTURE
MICRO NUMBER:: 10362585
SPECIMEN QUALITY:: ADEQUATE

## 2019-05-09 NOTE — Telephone Encounter (Signed)
[name] (Child) [phone]   Spoke with patient's daughter and informed her of the below. She states she will attempt to convince her mother to go. States that the Dermatologist and Dentist are just two places the patient refuses to go.    [name] states she will talk to the patient and if she agrees will call us back to let us know where her father, patient's husband, goes.

## 2019-05-09 NOTE — Telephone Encounter (Signed)
-----   Message from Delorise Jackson, MD sent at 05/08/2019 11:45 AM EDT ----- Call daughter and rec pt she dermatology [address] where her dad goes inform daughter pt declined She has precancerous changes on her handsI rec derm she if she can convince and let me know where dad goes will place referral

## 2019-05-10 ENCOUNTER — Other Ambulatory Visit: Payer: Self-pay | Admitting: Internal Medicine

## 2019-05-10 DIAGNOSIS — E039 Hypothyroidism, unspecified: Secondary | ICD-10-CM

## 2019-05-10 MED ORDER — LEVOTHYROXINE SODIUM 125 MCG PO TABS: 125.0000 ug | ORAL_TABLET | Freq: Every day | ORAL | 3 refills | Status: DC

## 2019-05-13 ENCOUNTER — Telehealth: Payer: Self-pay | Admitting: Internal Medicine

## 2019-05-13 NOTE — Telephone Encounter (Signed)
-----   Message from Delorise Jackson, MD sent at 05/10/2019  1:14 PM EDT ----- Urine ok no UTI  Hemoglobin elevated again -does she want to see hematology/blood specialists about this?   Sodium slightly low  -does she want me to order chest Xray to make sure lungs look ok?   Thyroid lab elevated  -increased thyroid med from 100 to 125 mcg [facility]  -sch f/u in office in 3 months to repeat and MD appt  -if thyroid lab still uncontrolled we may need to do thyroid US in the future

## 2019-05-13 NOTE — Telephone Encounter (Signed)
Pt's husband called regarding pt's lab results. He would like a call back on cell phone number.

## 2019-05-13 NOTE — Telephone Encounter (Signed)
Left message to return call 

## 2019-05-14 NOTE — Telephone Encounter (Signed)
Pt's husband informed of the below. He states that unless this is a medical emergency and deemed completely necessary then the pt will not do this.   Declines referral and x-ray.   Pt's husband states they are concerned about the spots on his wife's hands and will convince her to go to Dermatology. He would like the referral placed to [facility] on [facility], Dr Kellie Moor.   Pt has increased her thyroid med and is scheduled for July 14 th at 2:00 pm.

## 2019-05-15 NOTE — Addendum Note (Signed)
Addended by: Orland Mustard on: 05/15/2019 06:19 PM   Modules accepted: Orders

## 2019-07-17 ENCOUNTER — Other Ambulatory Visit: Payer: Self-pay

## 2019-07-17 ENCOUNTER — Ambulatory Visit
Admission: EM | Admit: 2019-07-17 | Discharge: 2019-07-17 | Disposition: A | Discharge status: Home / Self Care | Payer: Medicare Other | Attending: Emergency Medicine | Admitting: Emergency Medicine

## 2019-07-17 ENCOUNTER — Telehealth: Payer: Self-pay | Admitting: Internal Medicine

## 2019-07-17 DIAGNOSIS — R300 Dysuria: Secondary | ICD-10-CM

## 2019-07-17 DIAGNOSIS — J069 Acute upper respiratory infection, unspecified: Secondary | ICD-10-CM | POA: Diagnosis present

## 2019-07-17 LAB — POCT URINALYSIS DIP (MANUAL ENTRY)
Bilirubin, UA: NEGATIVE
Glucose, UA: NEGATIVE mg/dL
Ketones, POC UA: NEGATIVE mg/dL
Nitrite, UA: NEGATIVE
Protein Ur, POC: 30 mg/dL — AB
Spec Grav, UA: 1.02 (ref 1.010–1.025)
Urobilinogen, UA: 0.2 E.U./dL
pH, UA: 8 (ref 5.0–8.0)

## 2019-07-17 MED ORDER — CEPHALEXIN 500 MG PO CAPS: 500.0000 mg | ORAL_CAPSULE | Freq: Two times a day (BID) | ORAL | 0 refills | Status: AC

## 2019-07-17 NOTE — Telephone Encounter (Signed)
Pt husband called to schedule his wife an appt for a UTI while on the phone he took another call and hung up Pt daughter then called back saying that we hung up on her dad. She said that she needed to schedule her mother for an appt for a UTI. I made her aware that we didn't have any appts available for today or tomorrow  and it would need to be an UC visit. 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 I made her aware that Dr. Olivia Mackie is out of the office this week

## 2019-07-17 NOTE — ED Provider Notes (Signed)
Laura Carpenter    CSN: [id number] Arrival date & time: 07/17/19  1114      History   Chief Complaint Chief Complaint  Patient presents with  . Urinary Urgency    HPI Laura Carpenter is a 84 y.o. female.   Accompanied by her daughter, patient presents with dysuria, frequency, urgency since last night.  She reports a low-grade fever today; runny nose and nonproductive cough x 2-3 days.  She denies chills, abdominal pain, back pain, pelvic pain, rash, or other symptoms.  No treatments attempted at home.  The history is provided by the patient and a relative.    Past Medical History:  Diagnosis Date  . Breast cancer ([place])    02/17/10: right lumpectomy reveals 0.8 cm of invasive ductal carcinoma with 1/3 LN+ for isolated tumor cell cluster in Oncotype recurrent score 19 or 12% chance of distant recurrence; Femara self-discontinued secondary to side effects. She continues to decline further therapy  . Chronic hyponatremia    Hyponatremia for which she has had since somewhere in the mid 1990's. She's had multiple hospitalization, one hospitalization in 1993, she was diagnosed with an EF of 25% as well, though coronaries were clean during that hospitalization. The cath showed an EF of 50%. She is being worked up for her hyponatremia at [facility].  . Dementia ([place])   . Dizziness   . 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 She subsequently underwent repeat angioplasty a few years later. 12/2009 Abdominal Duplex: <60% bilateral RAS, Sequential narrowing and dilations of renal arteries, suggests fibromuscular dysplasia.   . H/O electrolyte imbalance   . Hearing loss   . Hemorrhoid   . History of radiation therapy 2011  . Hypertension   . Hypothyroidism   . Ingrowing nail, left great toe   . Otalgia of right ear 11/10/2011  . Polycythemia   . Skin cancer    squamous cell skin cancer removed by Dermatology--arms and hands  . Sleep apnea   . UTI  (urinary tract infection)     Patient Active Problem List   Diagnosis Date Noted  . Actinic keratosis 05/08/2019  . Senile dementia of Alzheimer's type ([place]) 06/29/2018  . Uterine fibroid 06/28/2018  . Hyponatremia 06/28/2018  . HLD (hyperlipidemia) 06/28/2018  . History of breast cancer 06/28/2018  . B12 deficiency 06/28/2018  . Skin cancer   . Hypothyroidism   . Hypertension   . Chronic hyponatremia   . Breast cancer ([place])   . Sleep apnea   . 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 Dementia ([place])   . Dehydration 06/29/2015  . Secondary polycythemia 10/17/2014    Past Surgical History:  Procedure Laterality Date  . ANGIOPLASTY OF RIGHT RENAL ARTERY  05/04/1995  . APPENDECTOMY    . BREAST LUMPECTOMY Right 2012   performed [facility]   . core biopsy breast Right 2012   ultrasound guided core needle biopsy of the right breast reveals invasive ductal carcinoma, ER/PR positive, HER2 neu   . FRACTURE SURGERY  2010   right    OB History   No obstetric history on file.      Home Medications    Prior to Admission medications   Medication Sig Start Date End Date Taking? Authorizing Provider  amLODipine-benazepril (LOTREL) 10-40 MG capsule Take 1 capsule by mouth daily. 04/19/19  Yes McLean-Scocuzza, Nino Glow, MD  aspirin 81 MG tablet Take 81 mg by mouth daily.  Yes [provider]  atorvastatin (LIPITOR) 10 MG tablet Take 1 tablet (10 mg total) by mouth daily at 6 PM. 04/19/19  Yes McLean-Scocuzza, Nino Glow, MD  Cholecalciferol (VITAMIN D3) 50 MCG (2000 UT) TABS Take 4,000 Units by mouth daily.   Yes [provider]  levothyroxine (SYNTHROID) 125 MCG tablet Take 1 tablet (125 mcg total) by mouth daily before breakfast. 05/10/19  Yes McLean-Scocuzza, Nino Glow, MD  rivastigmine (EXELON) 9.5 mg/24hr  04/24/18  Yes [provider]  vitamin B-12 (CYANOCOBALAMIN) 1000 MCG tablet Take by mouth.   Yes [provider]  cephALEXin (KEFLEX) 500  MG capsule Take 1 capsule (500 mg total) by mouth 2 (two) times daily for 5 days. 07/17/19 07/22/19  Sharion Balloon, NP    Family History Family History  Problem Relation Age of Onset  . Hypertension Mother   . Cancer Mother        ? type lived 8   . Alcohol abuse Father        died 59   . Cirrhosis Father   . Hypertension Daughter        [name]  . Cirrhosis Brother        a lot of health problems died 42s-70s   . Hyperlipidemia Son     Social History Social History   Tobacco Use  . Smoking status: Former Smoker    Packs/day: 0.25    Years: 20.00    Pack years: 5.00    Types: Cigarettes  . Smokeless tobacco: Former Systems developer    Quit date: 05/30/1976  . Tobacco comment: early teens; early college smoking Quit: 05/30/1976  Substance Use Topics  . Alcohol use: Yes    Alcohol/week: 0.0 standard drinks    Comment: occasional social drinker  . Drug use: No     Allergies   Aricept [donepezil hcl]   Review of Systems Review of Systems  Constitutional: Negative for chills and fever.  HENT: Positive for rhinorrhea. Negative for ear pain and sore throat.   Eyes: Negative for pain and visual disturbance.  Respiratory: Positive for cough. Negative for shortness of breath.   Cardiovascular: Negative for chest pain and palpitations.  Gastrointestinal: Negative for abdominal pain and vomiting.  Genitourinary: Positive for dysuria, frequency and urgency. Negative for hematuria.  Musculoskeletal: Negative for arthralgias and back pain.  Skin: Negative for color change and rash.  Neurological: Negative for seizures and syncope.  All other systems reviewed and are negative.    Physical Exam Triage Vital Signs ED Triage Vitals  Enc Vitals Group     BP 07/17/19 1117 (!) 144/94     Pulse Rate 07/17/19 1117 83     Resp 07/17/19 1117 17     Temp 07/17/19 1117 (!) 100.5 F (38.1 C)     Temp Source 07/17/19 1117 Oral     SpO2 07/17/19 1117 96 %     Weight 07/17/19 1117 140 lb (63.5  kg)     Height 07/17/19 1117 5' 6.5" (1.689 m)     Head Circumference --      Peak Flow --      Pain Score 07/17/19 1116 2     Pain Loc --      Pain Edu? --      Excl. in [place]? --    No data found.  Updated Vital Signs BP (!) 144/94 (BP Location: Left Arm)   Pulse 83   Temp (!) 100.5 F (38.1 C) (Oral)  Resp 17   Ht 5' 6.5" (1.689 m)   Wt 140 lb (63.5 kg)   SpO2 96%   BMI 22.26 kg/m   Visual Acuity Right Eye Distance:   Left Eye Distance:   Bilateral Distance:    Right Eye Near:   Left Eye Near:    Bilateral Near:     Physical Exam Vitals and nursing note reviewed.  Constitutional:      General: She is not in acute distress.    Appearance: She is well-developed.  HENT:     Head: Normocephalic and atraumatic.     Right Ear: Tympanic membrane normal.     Left Ear: Tympanic membrane normal.     Nose: Nose normal.     Mouth/Throat:     Mouth: Mucous membranes are moist.     Pharynx: Oropharynx is clear.  Eyes:     Conjunctiva/sclera: Conjunctivae normal.  Cardiovascular:     Rate and Rhythm: Normal rate and regular rhythm.     Heart sounds: No murmur heard.   Pulmonary:     Effort: Pulmonary effort is normal. No respiratory distress.     Breath sounds: Normal breath sounds.  Abdominal:     Palpations: Abdomen is soft.     Tenderness: There is no abdominal tenderness. There is no right CVA tenderness, left CVA tenderness, guarding or rebound.  Musculoskeletal:     Cervical back: Neck supple.  Skin:    General: Skin is warm and dry.     Findings: No rash.  Neurological:     General: No focal deficit present.     Mental Status: She is alert and oriented to person, place, and time.     Gait: Gait normal.  Psychiatric:        Mood and Affect: Mood normal.        Behavior: Behavior normal.      UC Treatments / Results  Labs (all labs ordered are listed, but only abnormal results are displayed) Labs Reviewed  POCT URINALYSIS DIP (MANUAL ENTRY) -  Abnormal; Notable for the following components:      Result Value   Clarity, UA cloudy (*)    Blood, UA small (*)    Protein Ur, POC =30 (*)    Leukocytes, UA Small (1+) (*)    All other components within normal limits  URINE CULTURE    EKG   Radiology No results found.  Procedures Procedures (including critical care time)  Medications Ordered in UC Medications - No data to display  Initial Impression / Assessment and Plan / UC Course  I have reviewed the triage vital signs and the nursing notes.  Pertinent labs & imaging results that were available during my care of the patient were reviewed by me and considered in my medical decision making (see chart for details).   Dysuria, mild upper respiratory tract infection.  Urine culture pending.  Treating with Keflex.  Symptomatic treatment for cold symptoms.  Instructed patient to follow-up with her PCP if her symptoms are not improving.  Patient agrees to plan of care.     Final Clinical Impressions(s) / UC Diagnoses   Final diagnoses:  Dysuria  Upper respiratory tract infection, unspecified type     Discharge Instructions     A urine culture is pending.  We will call you if your antibiotic needs to be changed or discontinued.    Take the antibiotic as directed.    Follow-up with your primary care provider if your  symptoms are not improving.        ED Prescriptions    Medication Sig Dispense Auth. Provider   cephALEXin (KEFLEX) 500 MG capsule Take 1 capsule (500 mg total) by mouth 2 (two) times daily for 5 days. 10 capsule Sharion Balloon, NP     PDMP not reviewed this encounter.   Sharion Balloon, NP 07/17/19 1143

## 2019-07-17 NOTE — ED Triage Notes (Signed)
Patient complains of urinary frequency, urgency and burning that started overnight.

## 2019-07-17 NOTE — Discharge Instructions (Addendum)
A urine culture is pending.  We will call you if your antibiotic needs to be changed or discontinued.    Take the antibiotic as directed.    Follow-up with your primary care provider if your symptoms are not improving.

## 2019-07-18 LAB — URINE CULTURE: Culture: NO GROWTH

## 2019-08-05 ENCOUNTER — Encounter: Payer: Self-pay | Admitting: Internal Medicine

## 2019-08-07 ENCOUNTER — Other Ambulatory Visit: Payer: Self-pay

## 2019-08-07 ENCOUNTER — Ambulatory Visit (INDEPENDENT_AMBULATORY_CARE_PROVIDER_SITE_OTHER): Payer: Medicare Other | Admitting: Internal Medicine

## 2019-08-07 ENCOUNTER — Encounter: Payer: Self-pay | Admitting: Internal Medicine

## 2019-08-07 ENCOUNTER — Telehealth: Payer: Self-pay | Admitting: Internal Medicine

## 2019-08-07 VITALS — BP 122/78 | HR 77 | Temp 97.9°F | Ht 66.5 in | Wt 133.6 lb

## 2019-08-07 DIAGNOSIS — R269 Unspecified abnormalities of gait and mobility: Secondary | ICD-10-CM

## 2019-08-07 DIAGNOSIS — R1013 Epigastric pain: Secondary | ICD-10-CM | POA: Diagnosis not present

## 2019-08-07 DIAGNOSIS — N3000 Acute cystitis without hematuria: Secondary | ICD-10-CM

## 2019-08-07 DIAGNOSIS — I773 Arterial fibromuscular dysplasia: Secondary | ICD-10-CM

## 2019-08-07 DIAGNOSIS — E039 Hypothyroidism, unspecified: Secondary | ICD-10-CM

## 2019-08-07 DIAGNOSIS — E871 Hypo-osmolality and hyponatremia: Secondary | ICD-10-CM

## 2019-08-07 DIAGNOSIS — I150 Renovascular hypertension: Secondary | ICD-10-CM

## 2019-08-07 LAB — COMPREHENSIVE METABOLIC PANEL
ALT: 22 U/L (ref 0–35)
AST: 22 U/L (ref 0–37)
Albumin: 4.7 g/dL (ref 3.5–5.2)
Alkaline Phosphatase: 91 U/L (ref 39–117)
BUN: 12 mg/dL (ref 6–23)
CO2: 27 mEq/L (ref 19–32)
Calcium: 10.2 mg/dL (ref 8.4–10.5)
Chloride: 96 mEq/L (ref 96–112)
Creatinine, Ser: 0.81 mg/dL (ref 0.40–1.20)
GFR: 67.67 mL/min (ref >60.00)
Glucose, Bld: 95 mg/dL (ref 70–99)
Potassium: 4.4 mEq/L (ref 3.5–5.1)
Sodium: 133 mEq/L — ABNORMAL LOW (ref 135–145)
Total Bilirubin: 0.7 mg/dL (ref 0.2–1.2)
Total Protein: 7.2 g/dL (ref 6.0–8.3)

## 2019-08-07 LAB — CBC WITH DIFFERENTIAL/PLATELET
Basophils Absolute: 0 10*3/uL (ref 0.0–0.1)
Basophils Relative: 0.4 % (ref 0.0–3.0)
Eosinophils Absolute: 0.1 10*3/uL (ref 0.0–0.7)
Eosinophils Relative: 1.7 % (ref 0.0–5.0)
HCT: 43.5 % (ref 36.0–46.0)
Hemoglobin: 14.9 g/dL (ref 12.0–15.0)
Lymphocytes Relative: 25.3 % (ref 12.0–46.0)
Lymphs Abs: 1.4 10*3/uL (ref 0.7–4.0)
MCHC: 34.2 g/dL (ref 30.0–36.0)
MCV: 98.2 fl (ref 78.0–100.0)
Monocytes Absolute: 0.6 10*3/uL (ref 0.1–1.0)
Monocytes Relative: 11.2 % (ref 3.0–12.0)
Neutro Abs: 3.5 10*3/uL (ref 1.4–7.7)
Neutrophils Relative %: 61.4 % (ref 43.0–77.0)
Platelets: 291 10*3/uL (ref 150.0–400.0)
RBC: 4.43 Mil/uL (ref 3.87–5.11)
RDW: 13.5 % (ref 11.5–15.5)
WBC: 5.7 10*3/uL (ref 4.0–10.5)

## 2019-08-07 LAB — TSH: TSH: 1.25 u[IU]/mL (ref 0.35–4.50)

## 2019-08-07 MED ORDER — CEPHALEXIN 500 MG PO CAPS
500.0000 mg | ORAL_CAPSULE | Freq: Two times a day (BID) | ORAL | 0 refills | Status: DC
Start: 2019-08-07 — End: 2020-06-02

## 2019-08-07 NOTE — Telephone Encounter (Signed)
Patients daughter called back office to schedule [place].

## 2019-08-07 NOTE — Telephone Encounter (Signed)
lft vm for pt to call ofc to sch US 

## 2019-08-07 NOTE — Patient Instructions (Addendum)
Bathroom schedule use bathroom every 3-4 hours   Referral Home health Physical therapy   Overactive Bladder, Adult  Overactive bladder refers to a condition in which a person has a sudden need to pass urine. The person may leak urine if he or she cannot get to the bathroom fast enough (urinary incontinence). A person with this condition may also wake up several times in the night to go to the bathroom. Overactive bladder is associated with poor nerve signals between your bladder and your brain. Your bladder may get the signal to empty before it is full. You may also have very sensitive muscles that make your bladder squeeze too soon. These symptoms might interfere with daily work or social activities. What are the causes? This condition may be associated with or caused by:  Urinary tract infection.  Infection of nearby tissues, such as the prostate.  Prostate enlargement.  Surgery on the uterus or urethra.  Bladder stones, inflammation, or tumors.  Drinking too much caffeine or alcohol.  Certain medicines, especially medicines that get rid of extra fluid in the body (diuretics).  Muscle or nerve weakness, especially from: ? A spinal cord injury. ? Stroke. ? Multiple sclerosis. ? Parkinson's disease.  Diabetes.  Constipation. What increases the risk? You may be at greater risk for overactive bladder if you:  Are an older adult.  Smoke.  Are going through menopause.  Have prostate problems.  Have a neurological disease, such as stroke, dementia, Parkinson's disease, or multiple sclerosis (MS).  Eat or drink things that irritate the bladder. These include alcohol, spicy food, and caffeine.  Are overweight or obese. What are the signs or symptoms? Symptoms of this condition include:  Sudden, strong urge to urinate.  Leaking urine.  Urinating 8 or more times a day.  Waking up to urinate 2 or more times a night. How is this diagnosed? Your health care provider  may suspect overactive bladder based on your symptoms. He or she will diagnose this condition by:  A physical exam and medical history.  Blood or urine tests. You might need bladder or urine tests to help determine what is causing your overactive bladder. You might also need to see a health care provider who specializes in urinary tract problems (urologist). How is this treated? Treatment for overactive bladder depends on the cause of your condition and whether it is mild or severe. You can also make lifestyle changes at home. Options include:  Bladder training. This may include: ? Learning to control the urge to urinate by following a schedule that directs you to urinate at regular intervals (timed voiding). ? Doing Kegel exercises to strengthen your pelvic floor muscles, which support your bladder. Toning these muscles can help you control urination, even if your bladder muscles are overactive.  Special devices. This may include: ? Biofeedback, which uses sensors to help you become aware of your body's signals. ? Electrical stimulation, which uses electrodes placed inside the body (implanted) or outside the body. These electrodes send gentle pulses of electricity to strengthen the nerves or muscles that control the bladder. ? Women may use a plastic device that fits into the vagina and supports the bladder (pessary).  Medicines. ? Antibiotics to treat bladder infection. ? Antispasmodics to stop the bladder from releasing urine at the wrong time. ? Tricyclic antidepressants to relax bladder muscles. ? Injections of botulinum toxin type A directly into the bladder tissue to relax bladder muscles.  Lifestyle changes. This may include: ? Weight loss. Talk  to your health care provider about weight loss methods that would work best for you. ? Diet changes. This may include reducing how much alcohol and caffeine you consume, or drinking fluids at different times of the day. ? Not smoking. Do  not use any products that contain nicotine or tobacco, such as cigarettes and e-cigarettes. If you need help quitting, ask your health care provider.  Surgery. ? A device may be implanted to help manage the nerve signals that control urination. ? An electrode may be implanted to stimulate electrical signals in the bladder. ? A procedure may be done to change the shape of the bladder. This is done only in very severe cases. Follow these instructions at home: Lifestyle  Make any diet or lifestyle changes that are recommended by your health care provider. These may include: ? Drinking less fluid or drinking fluids at different times of the day. ? Cutting down on caffeine or alcohol. ? Doing Kegel exercises. ? Losing weight if needed. ? Eating a healthy and balanced diet to prevent constipation. This may include:  Eating foods that are high in fiber, such as fresh fruits and vegetables, whole grains, and beans.  Limiting foods that are high in fat and processed sugars, such as fried and sweet foods. General instructions  Take over-the-counter and prescription medicines only as told by your health care provider.  If you were prescribed an antibiotic medicine, take it as told by your health care provider. Do not stop taking the antibiotic even if you start to feel better.  Use any implants or pessary as told by your health care provider.  If needed, wear pads to absorb urine leakage.  Keep a journal or log to track how much and when you drink and when you feel the need to urinate. This will help your health care provider monitor your condition.  Keep all follow-up visits as told by your health care provider. This is important. Contact a health care provider if:  You have a fever.  Your symptoms do not get better with treatment.  Your pain and discomfort get worse.  You have more frequent urges to urinate. Get help right away if:  You are not able to control your  bladder. Summary  Overactive bladder refers to a condition in which a person has a sudden need to pass urine.  Several conditions may lead to an overactive bladder.  Treatment for overactive bladder depends on the cause and severity of your condition.  Follow your health care provider's instructions about lifestyle changes, doing Kegel exercises, keeping a journal, and taking medicines. This information is not intended to replace advice given to you by your health care provider. Make sure you discuss any questions you have with your health care provider. Document Revised: 05/03/2018 Document Reviewed: 01/26/2017 Elsevier Patient Education  [place].

## 2019-08-07 NOTE — Progress Notes (Signed)
Chief Complaint  Patient presents with  . Follow-up   F/u with husband  1. uti 07/17/19 with fever culture neg but was having burning with urination given keflex 500 mg bid x 5 days. She still has urgency  And also c/o overactive bladder daughter bought depends yesterday advised if going to wear change 3-4 x per day but do bathroom schedule Q3-4 hours  2. Abnormal gait with fall going to the mailbox walks with cane at times given Rx rollator with brakes and seat walking with shuffling gait and unsteady on feet uses cane prn and wants H/H PT before 10/10/2019 trip to [place]   Review of Systems  Constitutional: Negative for weight loss.  HENT: Positive for hearing loss.        +wears aids   Eyes: Negative for blurred vision.  Respiratory: Negative for shortness of breath.   Cardiovascular: Negative for chest pain.  Gastrointestinal: Positive for abdominal pain. Negative for blood in stool.  Genitourinary: Positive for urgency.  Musculoskeletal: Positive for falls.  Skin: Negative for rash.  Neurological: Negative for headaches.  Psychiatric/Behavioral: Negative for depression.   Past Medical History:  Diagnosis Date  . Breast cancer ([place])    02/17/10: right lumpectomy reveals 0.8 cm of invasive ductal carcinoma with 1/3 LN+ for isolated tumor cell cluster in Oncotype recurrent score 19 or 12% chance of distant recurrence; Femara self-discontinued secondary to side effects. She continues to decline further therapy  . Chronic hyponatremia    Hyponatremia for which she has had since somewhere in the mid 1990's. She's had multiple hospitalization, one hospitalization in 1993, she was diagnosed with an EF of 25% as well, though coronaries were clean during that hospitalization. The cath showed an EF of 50%. She is being worked up for her hyponatremia at [facility].  . Dementia ([place])   . Dizziness   . Fibromuscular dysplasia of renal artery ([place])    1997 Renal angioplasty at [facility]. She subsequently  underwent repeat angioplasty a few years later. 12/2009 Abdominal Duplex: <60% bilateral RAS, Sequential narrowing and dilations of renal arteries, suggests fibromuscular dysplasia.   . H/O electrolyte imbalance   . Hearing loss   . Hemorrhoid   . History of radiation therapy 2011  . Hypertension   . Hypothyroidism   . Ingrowing nail, left great toe   . Non-melanoma skin cancer    right hand  . Otalgia of right ear 11/10/2011  . Polycythemia   . Skin cancer    squamous cell skin cancer removed by Dermatology--arms and hands  . Sleep apnea   . UTI (urinary tract infection)    Past Surgical History:  Procedure Laterality Date  . ANGIOPLASTY OF RIGHT RENAL ARTERY  05/04/1995  . APPENDECTOMY    . BREAST LUMPECTOMY Right 2012   performed [facility]   . core biopsy breast Right 2012   ultrasound guided core needle biopsy of the right breast reveals invasive ductal carcinoma, ER/PR positive, HER2 neu   . FRACTURE SURGERY  2010   right   Family History  Problem Relation Age of Onset  . Hypertension Mother   . Cancer Mother        ? type lived 56   . Alcohol abuse Father        died 20   . Cirrhosis Father   . Hypertension Daughter        [name]  . Cirrhosis Brother        a lot of health problems died  60s-70s   . Hyperlipidemia Son    Social History   Socioeconomic History  . Marital status: Married    Spouse name: Not on file  . Number of children: Not on file  . Years of education: Not on file  . Highest education level: Not on file  Occupational History  . Not on file  Tobacco Use  . Smoking status: Former Smoker    Packs/day: 0.25    Years: 20.00    Pack years: 5.00    Types: Cigarettes  . Smokeless tobacco: Former Systems developer    Quit date: 05/30/1976  . Tobacco comment: early teens; early college smoking Quit: 05/30/1976  Substance and Sexual Activity  . Alcohol use: Yes    Alcohol/week: 0.0 standard drinks    Comment: occasional social drinker  . Drug use: No  .  Sexual activity: Yes    Partners: Male  Other Topics Concern  . Not on file  Social History Narrative   Married x 59 years as of 09/2018    3 kids 1 daughter and 2 sons 5 grands    Homemaker    DPR   1. Husband John [phone]; [phone]   2. Libby [phone]    From [place]   Social Determinants of Health   Financial Resource Strain:   . Difficulty of Paying Living Expenses:   Food Insecurity:   . Worried About Charity fundraiser in the Last Year:   . Arboriculturist in the Last Year:   Transportation Needs:   . Film/video editor (Medical):   Marland Kitchen Lack of Transportation (Non-Medical):   Physical Activity:   . Days of Exercise per Week:   . Minutes of Exercise per Session:   Stress:   . Feeling of Stress :   Social Connections:   . Frequency of Communication with Friends and Family:   . Frequency of Social Gatherings with Friends and Family:   . Attends Religious Services:   . Active Member of Clubs or Organizations:   . Attends Archivist Meetings:   Marland Kitchen Marital Status:   Intimate Partner Violence:   . Fear of Current or Ex-Partner:   . Emotionally Abused:   Marland Kitchen Physically Abused:   . Sexually Abused:    Current Meds  Medication Sig  . amLODipine-benazepril (LOTREL) 10-40 MG capsule Take 1 capsule by mouth daily.  Marland Kitchen aspirin 81 MG tablet Take 81 mg by mouth daily.  Marland Kitchen atorvastatin (LIPITOR) 10 MG tablet Take 1 tablet (10 mg total) by mouth daily at 6 PM.  . Cholecalciferol (VITAMIN D3) 50 MCG (2000 UT) TABS Take 4,000 Units by mouth daily.  Marland Kitchen levothyroxine (SYNTHROID) 125 MCG tablet Take 1 tablet (125 mcg total) by mouth daily before breakfast.  . rivastigmine (EXELON) 9.5 mg/24hr   . vitamin B-12 (CYANOCOBALAMIN) 1000 MCG tablet Take by mouth.   Allergies  Allergen Reactions  . Aricept [Donepezil Hcl]     GI upset    Recent Results (from the past 2160 hour(s))  POCT urinalysis dipstick     Status: Abnormal   Collection Time: 07/17/19  11:22 AM  Result Value Ref Range   Color, UA yellow yellow   Clarity, UA cloudy (A) clear   Glucose, UA negative negative mg/dL   Bilirubin, UA negative negative   Ketones, POC UA negative negative mg/dL   Spec Grav, UA 1.020 1.010 - 1.025   Blood, UA small (A) negative   pH,  UA 8.0 5.0 - 8.0   Protein Ur, POC =30 (A) negative mg/dL   Urobilinogen, UA 0.2 0.2 or 1.0 E.U./dL   Nitrite, UA Negative Negative   Leukocytes, UA Small (1+) (A) Negative  Urine Culture     Status: None   Collection Time: 07/17/19 11:30 AM   Specimen: Urine, Clean Catch  Result Value Ref Range   Specimen Description URINE, CLEAN CATCH    Special Requests NONE    Culture      NO GROWTH Performed at [facility], [place] [address], [place], [place] [address]    Report Status 07/18/2019 FINAL    Objective  Body mass index is 21.24 kg/m. Wt Readings from Last 3 Encounters:  08/07/19 133 lb 9.6 oz (60.6 kg)  07/17/19 140 lb (63.5 kg)  05/08/19 134 lb 6.4 oz (61 kg)   Temp Readings from Last 3 Encounters:  08/07/19 97.9 F (36.6 C) (Oral)  07/17/19 (!) 100.5 F (38.1 C) (Oral)  05/08/19 (!) 97 F (36.1 C) (Temporal)   BP Readings from Last 3 Encounters:  08/07/19 122/78  07/17/19 (!) 144/94  05/08/19 126/86   Pulse Readings from Last 3 Encounters:  08/07/19 77  07/17/19 83  05/08/19 67    Physical Exam Vitals and nursing note reviewed.  Constitutional:      Appearance: Normal appearance. She is well-developed and well-groomed.  HENT:     Head: Normocephalic and atraumatic.  Eyes:     Conjunctiva/sclera: Conjunctivae normal.     Pupils: Pupils are equal, round, and reactive to light.  Cardiovascular:     Rate and Rhythm: Normal rate and regular rhythm.     Heart sounds: Normal heart sounds. No murmur heard.   Pulmonary:     Effort: Pulmonary effort is normal.     Breath sounds: Normal breath sounds.  Skin:    General: Skin is warm and dry.       Neurological:     General:  No focal deficit present.     Mental Status: She is alert and oriented to person, place, and time. Mental status is at baseline.     Gait: Gait normal.  Psychiatric:        Attention and Perception: Attention and perception normal.        Mood and Affect: Mood and affect normal.        Speech: Speech normal.        Behavior: Behavior normal. Behavior is cooperative.        Thought Content: Thought content normal.        Cognition and Memory: Cognition and memory normal.        Judgment: Judgment normal.     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 Olivia Mackie McLean-Scocuzza-Internal Medicine

## 2019-08-09 LAB — URINALYSIS, ROUTINE W REFLEX MICROSCOPIC
Bacteria, UA: NONE SEEN /HPF
Bilirubin Urine: NEGATIVE
Glucose, UA: NEGATIVE
Hgb urine dipstick: NEGATIVE
Hyaline Cast: NONE SEEN /LPF
Ketones, ur: NEGATIVE
Nitrite: NEGATIVE
Protein, ur: NEGATIVE
RBC / HPF: NONE SEEN /HPF (ref 0–2)
Specific Gravity, Urine: 1.013 (ref 1.001–1.03)
WBC, UA: NONE SEEN /HPF (ref 0–5)
pH: 7 (ref 5.0–8.0)

## 2019-08-09 LAB — URINE CULTURE

## 2019-08-19 ENCOUNTER — Other Ambulatory Visit: Payer: Self-pay

## 2019-08-19 ENCOUNTER — Ambulatory Visit
Admission: RE | Admit: 2019-08-19 | Discharge: 2019-08-19 | Disposition: A | Discharge status: Home / Self Care | Payer: Medicare Other | Source: Ambulatory Visit | Attending: Internal Medicine | Admitting: Internal Medicine

## 2019-08-19 DIAGNOSIS — R1013 Epigastric pain: Secondary | ICD-10-CM

## 2019-08-21 ENCOUNTER — Telehealth: Payer: Self-pay | Admitting: Internal Medicine

## 2019-08-21 ENCOUNTER — Ambulatory Visit
Admission: RE | Admit: 2019-08-21 | Discharge: 2019-08-21 | Disposition: A | Discharge status: Home / Self Care | Payer: Medicare Other | Source: Ambulatory Visit | Attending: Internal Medicine | Admitting: Internal Medicine

## 2019-08-21 ENCOUNTER — Other Ambulatory Visit: Payer: Self-pay

## 2019-08-21 DIAGNOSIS — R1013 Epigastric pain: Secondary | ICD-10-CM | POA: Diagnosis present

## 2019-08-21 NOTE — Telephone Encounter (Signed)
Faxed orders for [id number] for physical therapy to evaluate,asses and monitor. Faxed on 08-21-19 to Home health certification and plan of care [facility]

## 2019-11-11 ENCOUNTER — Other Ambulatory Visit: Payer: Self-pay

## 2019-11-13 ENCOUNTER — Telehealth: Payer: Medicare Other | Admitting: Internal Medicine

## 2019-11-14 ENCOUNTER — Telehealth (INDEPENDENT_AMBULATORY_CARE_PROVIDER_SITE_OTHER): Payer: Medicare Other | Admitting: Internal Medicine

## 2019-11-14 ENCOUNTER — Other Ambulatory Visit: Payer: Self-pay

## 2019-11-14 ENCOUNTER — Encounter: Payer: Self-pay | Admitting: Internal Medicine

## 2019-11-14 VITALS — Ht 66.5 in | Wt 133.6 lb

## 2019-11-14 DIAGNOSIS — E039 Hypothyroidism, unspecified: Secondary | ICD-10-CM | POA: Diagnosis not present

## 2019-11-14 DIAGNOSIS — E871 Hypo-osmolality and hyponatremia: Secondary | ICD-10-CM | POA: Diagnosis not present

## 2019-11-14 DIAGNOSIS — E785 Hyperlipidemia, unspecified: Secondary | ICD-10-CM | POA: Diagnosis not present

## 2019-11-14 DIAGNOSIS — R197 Diarrhea, unspecified: Secondary | ICD-10-CM

## 2019-11-14 DIAGNOSIS — I150 Renovascular hypertension: Secondary | ICD-10-CM

## 2019-11-14 NOTE — Progress Notes (Signed)
Virtual Visit via Video Note  I connected with Laura Carpenter  on 11/14/19 at  1:45 PM EDT by a video enabled telemedicine application and verified that I am speaking with the correct person using two identifiers.  Location patient: home Location provider:work or home office Persons participating in the virtual visit: patient, provider pts daughter and husband   I discussed the limitations of evaluation and management by telemedicine and the availability of in person appointments. The patient expressed understanding and agreed to proceed.   HPI: 1. Diarrhea intermittently and random noted Sunday had accident on the floor and Weds both times only 1 episode of diarrhea. She did have coffee with cream and sugar at times eating ice cream and she hand husband went on trip to [place] patch been on that and 7% chance of diarrhea but pills were worse per her husband  They want to know if she can take otc immodium  No systemic sx's  2. Dementia saw [facility] Dr. Wynetta Emery 2021 rec increase exercise 30-45 min and rec outpatient PT pt declines for now  3. Hypothyroidism on levo 125 will do labs  4.HTN on norvasc-benazepirl 10-40 mg qd did not check today  ROS: See pertinent positives and negatives per HPI.  Past Medical History:  Diagnosis Date  . Breast cancer ([place])    02/17/10: right lumpectomy reveals 0.8 cm of invasive ductal carcinoma with 1/3 LN+ for isolated tumor cell cluster in Oncotype recurrent score 19 or 12% chance of distant recurrence; Femara self-discontinued secondary to side effects. She continues to decline further therapy  . Chronic hyponatremia    Hyponatremia for which she has had since somewhere in the mid 1990's. She's had multiple hospitalization, one hospitalization in 1993, she was diagnosed with an EF of 25% as well, though coronaries were clean during that hospitalization. The cath showed an EF of 50%. She is being worked up for her hyponatremia at [facility].  . Dementia  ([place])   . Dizziness   . Fibromuscular dysplasia of renal artery ([place])    1997 Renal angioplasty at [facility]. She subsequently underwent repeat angioplasty a few years later. 12/2009 Abdominal Duplex: <60% bilateral RAS, Sequential narrowing and dilations of renal arteries, suggests fibromuscular dysplasia.   . H/O electrolyte imbalance   . Hearing loss   . Hemorrhoid   . History of radiation therapy 2011  . Hypertension   . Hypothyroidism   . Ingrowing nail, left great toe   . Non-melanoma skin cancer    right hand  . Otalgia of right ear 11/10/2011  . Polycythemia   . Skin cancer    squamous cell skin cancer removed by Dermatology--arms and hands  . Sleep apnea   . UTI (urinary tract infection)     Past Surgical History:  Procedure Laterality Date  . ANGIOPLASTY OF RIGHT RENAL ARTERY  05/04/1995  . APPENDECTOMY    . BREAST LUMPECTOMY Right 2012   performed [facility]   . core biopsy breast Right 2012   ultrasound guided core needle biopsy of the right breast reveals invasive ductal carcinoma, ER/PR positive, HER2 neu   . FRACTURE SURGERY  2010   right     Current Outpatient Medications:  .  amLODipine-benazepril (LOTREL) 10-40 MG capsule, Take 1 capsule by mouth daily., Disp: 90 capsule, Rfl: 3 .  aspirin 81 MG tablet, Take 81 mg by mouth daily., Disp: , Rfl:  .  atorvastatin (LIPITOR) 10 MG tablet, Take 1 tablet (10 mg total) by  mouth daily at 6 PM., Disp: 90 tablet, Rfl: 3 .  cephALEXin (KEFLEX) 500 MG capsule, Take 1 capsule (500 mg total) by mouth 2 (two) times daily. With food, Disp: 10 capsule, Rfl: 0 .  Cholecalciferol (VITAMIN D3) 50 MCG (2000 UT) TABS, Take 4,000 Units by mouth daily., Disp: , Rfl:  .  levothyroxine (SYNTHROID) 125 MCG tablet, Take 1 tablet (125 mcg total) by mouth daily before breakfast., Disp: 90 tablet, Rfl: 3 .  rivastigmine (EXELON) 9.5 mg/24hr, , Disp: , Rfl:  .  vitamin B-12 (CYANOCOBALAMIN) 1000 MCG tablet, Take by mouth., Disp: , Rfl:    EXAM:  VITALS per patient if applicable:  GENERAL: alert, oriented, appears well and in no acute distress  HEENT: atraumatic, conjunttiva clear, no obvious abnormalities on inspection of external nose and ears  NECK: normal movements of the head and neck  LUNGS: on inspection no signs of respiratory distress, breathing rate appears normal, no obvious gross SOB, gasping or wheezing  CV: no obvious cyanosis  MS: moves all visible extremities without noticeable abnormality  PSYCH/NEURO: pleasant and cooperative, no obvious depression or anxiety, speech and thought processing grossly intact  ASSESSMENT AND PLAN:  Discussed the following assessment and plan:  Diarrhea, unspecified type Prn immodium  Does not see infection  Food diary   Hyperlipidemia, unspecified hyperlipidemia type - Plan: Comprehensive metabolic panel, Lipid panel Cont lipitor 10 mg qhs   Hyponatremia - Plan: Comprehensive metabolic panel  Hypothyroidism, unspecified type - Plan: TSH On levo 125 mcg qd   Renovascular hypertension - Plan: Comprehensive metabolic panel, CBC with Differential/Platelet  HM utd flu total care, prevnar, pna 23 utd  shingrix had 2/2 covid vx 3/3 Consider Tdap in future   Out of age window pap, mammogram (last 03/06/15 negative diag b/l pt does have h/o breast cancer), colonoscopy 01/03/2019 mammo normal DEXA 11/06/17 osteopenia lumbar not reported 2/2 scoliosis or sclerosis  -rec calcium 600 mg qd and D3 4000 IU daily  Dentist declines for now  Dermatology seen in 2021 spring for tx nmsc right hand and Aks  -we discussed possible serious and likely etiologies, options for evaluation and workup, limitations of telemedicine visit vs in person visit, treatment, treatment risks and precautions.     I discussed the assessment and treatment plan with the patient. The patient was provided an opportunity to ask questions and all were answered. The patient agreed with the  plan and demonstrated an understanding of the instructions.    Time spent 20 min Delorise Jackson, MD

## 2019-11-26 ENCOUNTER — Other Ambulatory Visit (INDEPENDENT_AMBULATORY_CARE_PROVIDER_SITE_OTHER): Payer: Medicare Other

## 2019-11-26 ENCOUNTER — Other Ambulatory Visit: Payer: Self-pay

## 2019-11-26 DIAGNOSIS — E785 Hyperlipidemia, unspecified: Secondary | ICD-10-CM | POA: Diagnosis not present

## 2019-11-26 DIAGNOSIS — I150 Renovascular hypertension: Secondary | ICD-10-CM

## 2019-11-26 DIAGNOSIS — E039 Hypothyroidism, unspecified: Secondary | ICD-10-CM

## 2019-11-26 DIAGNOSIS — E871 Hypo-osmolality and hyponatremia: Secondary | ICD-10-CM | POA: Diagnosis not present

## 2019-11-26 LAB — COMPREHENSIVE METABOLIC PANEL
ALT: 25 U/L (ref 0–35)
AST: 26 U/L (ref 0–37)
Albumin: 4.6 g/dL (ref 3.5–5.2)
Alkaline Phosphatase: 73 U/L (ref 39–117)
BUN: 13 mg/dL (ref 6–23)
CO2: 27 mEq/L (ref 19–32)
Calcium: 9.9 mg/dL (ref 8.4–10.5)
Chloride: 99 mEq/L (ref 96–112)
Creatinine, Ser: 0.75 mg/dL (ref 0.40–1.20)
GFR: 74.14 mL/min (ref >60.00)
Glucose, Bld: 95 mg/dL (ref 70–99)
Potassium: 4.2 mEq/L (ref 3.5–5.1)
Sodium: 136 mEq/L (ref 135–145)
Total Bilirubin: 1.1 mg/dL (ref 0.2–1.2)
Total Protein: 7 g/dL (ref 6.0–8.3)

## 2019-11-26 LAB — LIPID PANEL
Cholesterol: 194 mg/dL (ref 0–200)
HDL: 99.4 mg/dL (ref >39.00)
LDL Cholesterol: 83 mg/dL (ref 0–99)
NonHDL: 94.44
Total CHOL/HDL Ratio: 2
Triglycerides: 56 mg/dL (ref 0.0–149.0)
VLDL: 11.2 mg/dL (ref 0.0–40.0)

## 2019-11-26 LAB — CBC WITH DIFFERENTIAL/PLATELET
Basophils Absolute: 0 10*3/uL (ref 0.0–0.1)
Basophils Relative: 0.4 % (ref 0.0–3.0)
Eosinophils Absolute: 0.1 10*3/uL (ref 0.0–0.7)
Eosinophils Relative: 1.1 % (ref 0.0–5.0)
HCT: 46.7 % — ABNORMAL HIGH (ref 36.0–46.0)
Hemoglobin: 15.7 g/dL — ABNORMAL HIGH (ref 12.0–15.0)
Lymphocytes Relative: 34.9 % (ref 12.0–46.0)
Lymphs Abs: 2.1 10*3/uL (ref 0.7–4.0)
MCHC: 33.7 g/dL (ref 30.0–36.0)
MCV: 96.5 fl (ref 78.0–100.0)
Monocytes Absolute: 0.6 10*3/uL (ref 0.1–1.0)
Monocytes Relative: 9.5 % (ref 3.0–12.0)
Neutro Abs: 3.2 10*3/uL (ref 1.4–7.7)
Neutrophils Relative %: 54.1 % (ref 43.0–77.0)
Platelets: 219 10*3/uL (ref 150.0–400.0)
RBC: 4.84 Mil/uL (ref 3.87–5.11)
RDW: 14.2 % (ref 11.5–15.5)
WBC: 5.9 10*3/uL (ref 4.0–10.5)

## 2019-11-26 LAB — TSH: TSH: 2.91 u[IU]/mL (ref 0.35–4.50)

## 2020-03-19 ENCOUNTER — Other Ambulatory Visit: Payer: Self-pay | Admitting: Internal Medicine

## 2020-03-19 DIAGNOSIS — E785 Hyperlipidemia, unspecified: Secondary | ICD-10-CM

## 2020-03-25 ENCOUNTER — Other Ambulatory Visit: Payer: Self-pay | Admitting: Internal Medicine

## 2020-03-25 DIAGNOSIS — E039 Hypothyroidism, unspecified: Secondary | ICD-10-CM

## 2020-04-01 IMAGING — MG DIGITAL SCREENING BILAT W/ TOMO W/ CAD
6 of 12 series · 6 of 36 positions shown · non-contrast
Comparison: Previous exam(s).

CLINICAL DATA: Screening.

EXAM:
DIGITAL SCREENING BILATERAL MAMMOGRAM WITH TOMO AND CAD

[L CC synth-2D]
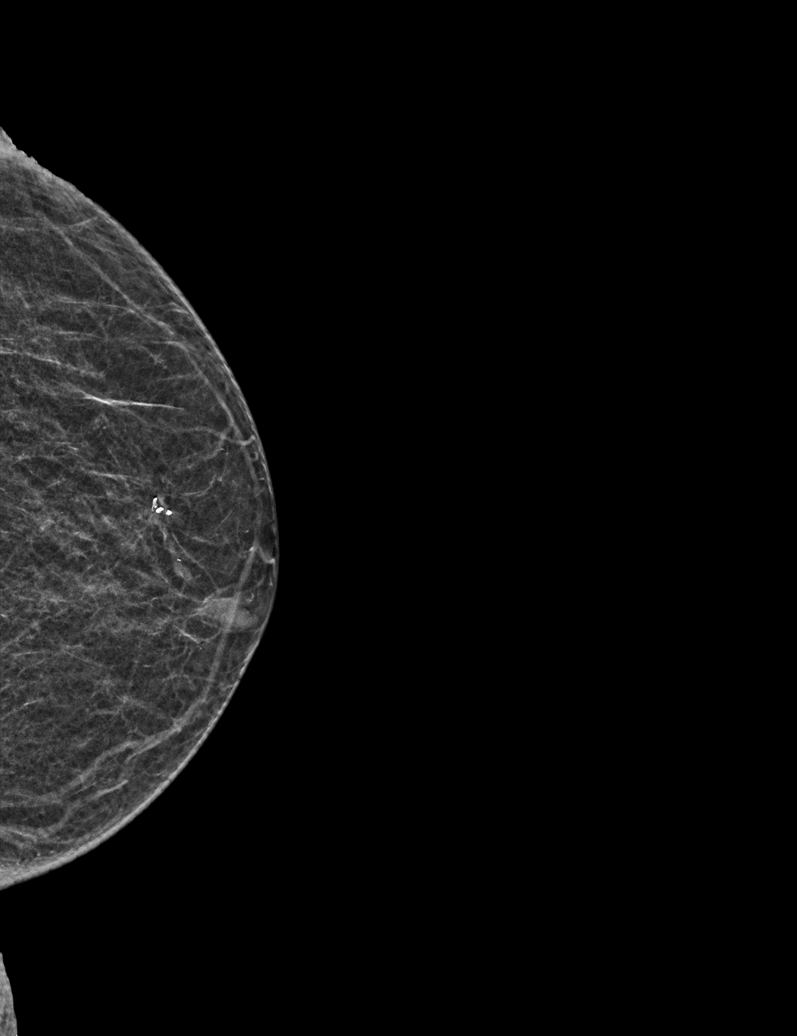

[R CC synth-2D (1 of 2)]
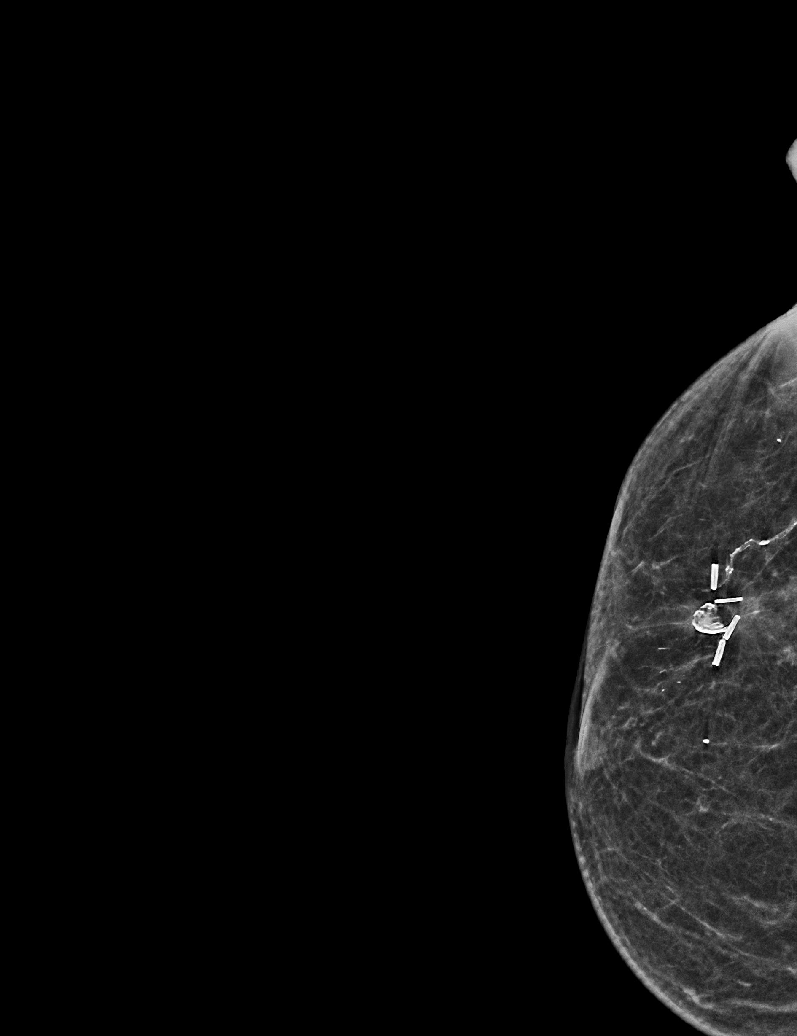

[L MLO synth-2D]
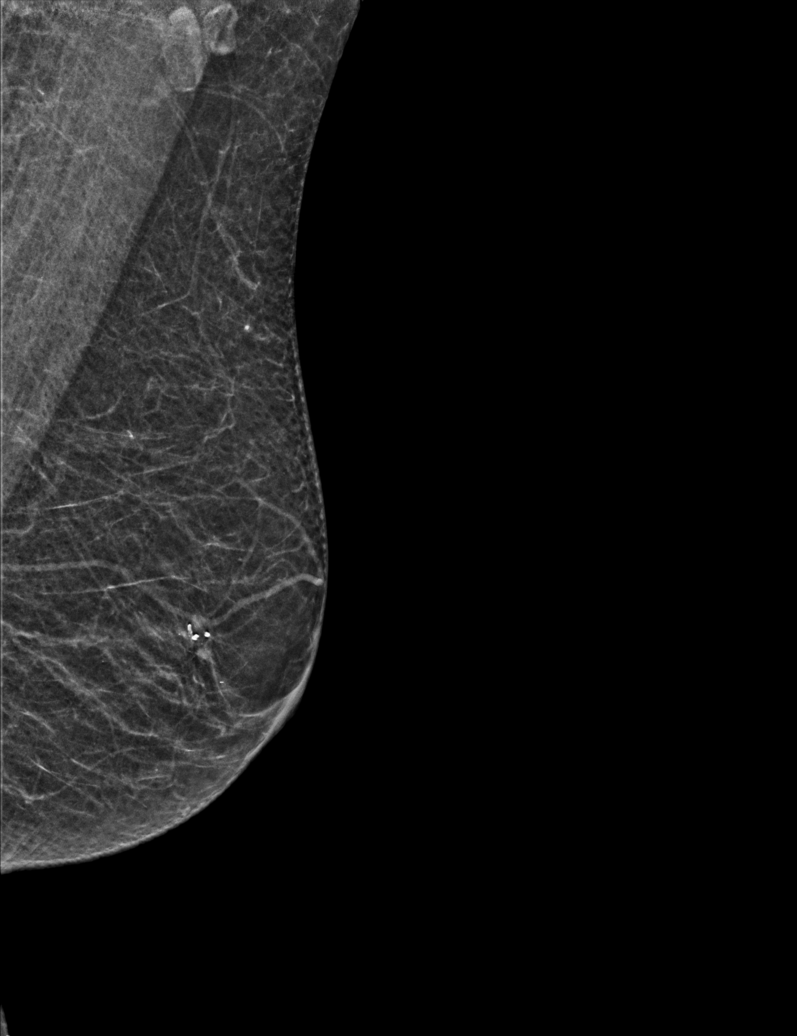

[R MLO synth-2D (1 of 2)]
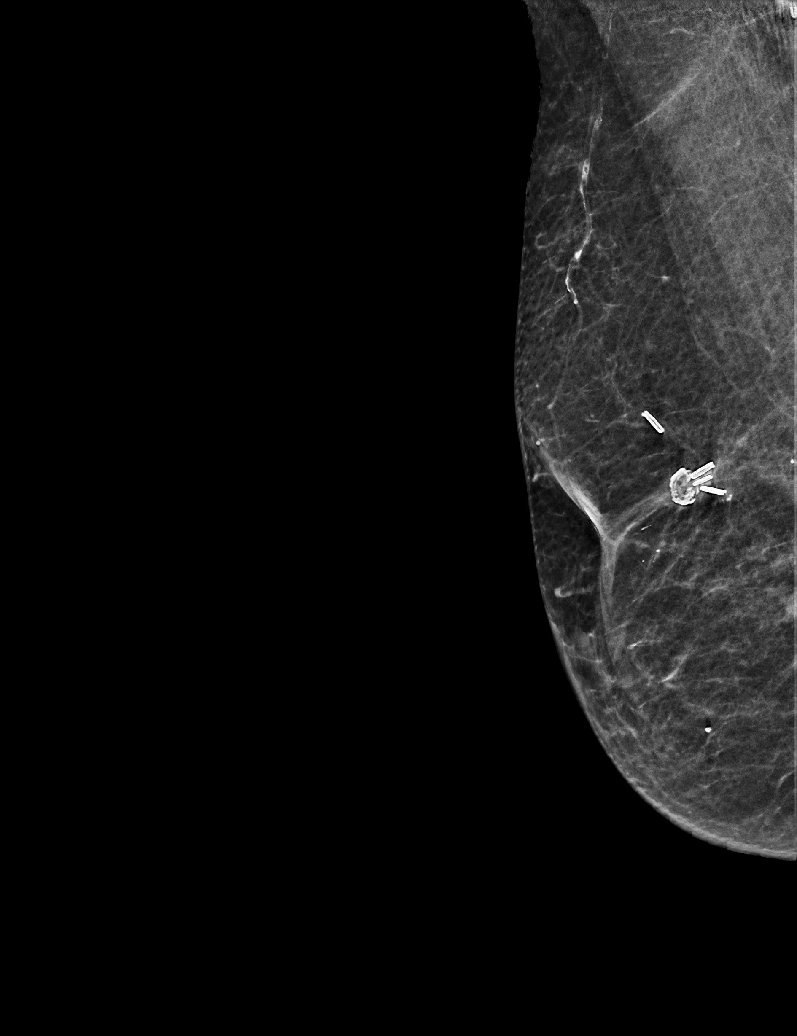

[R CC synth-2D (2 of 2)]
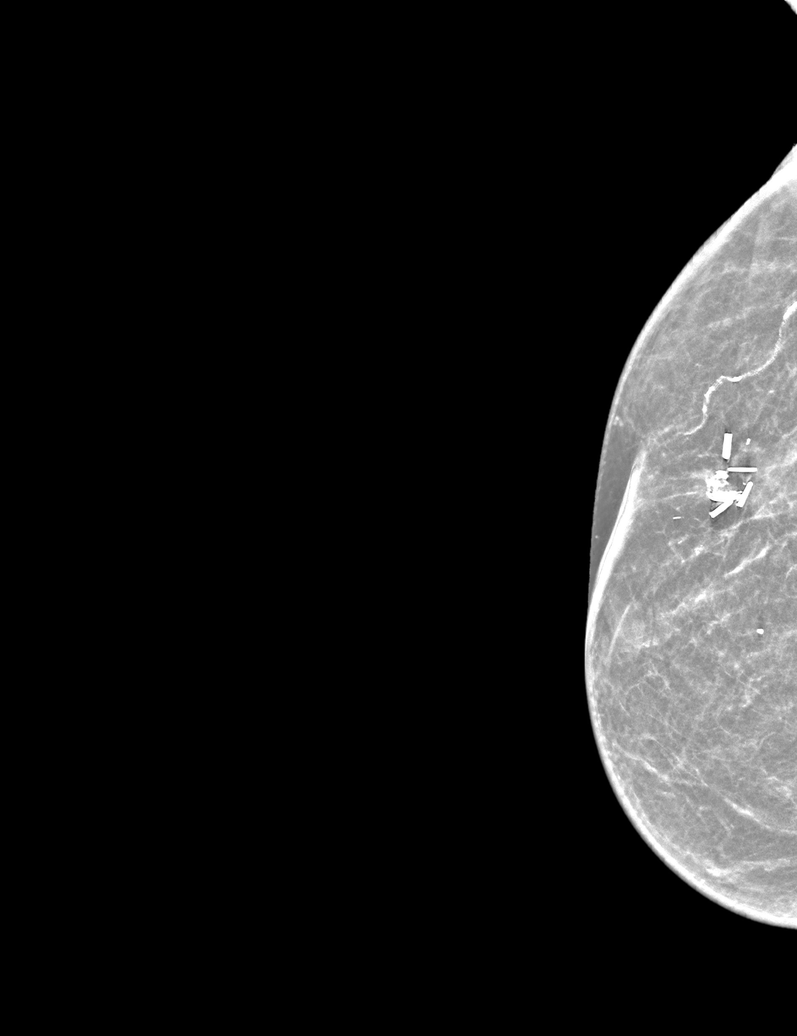

[R MLO synth-2D (2 of 2)]
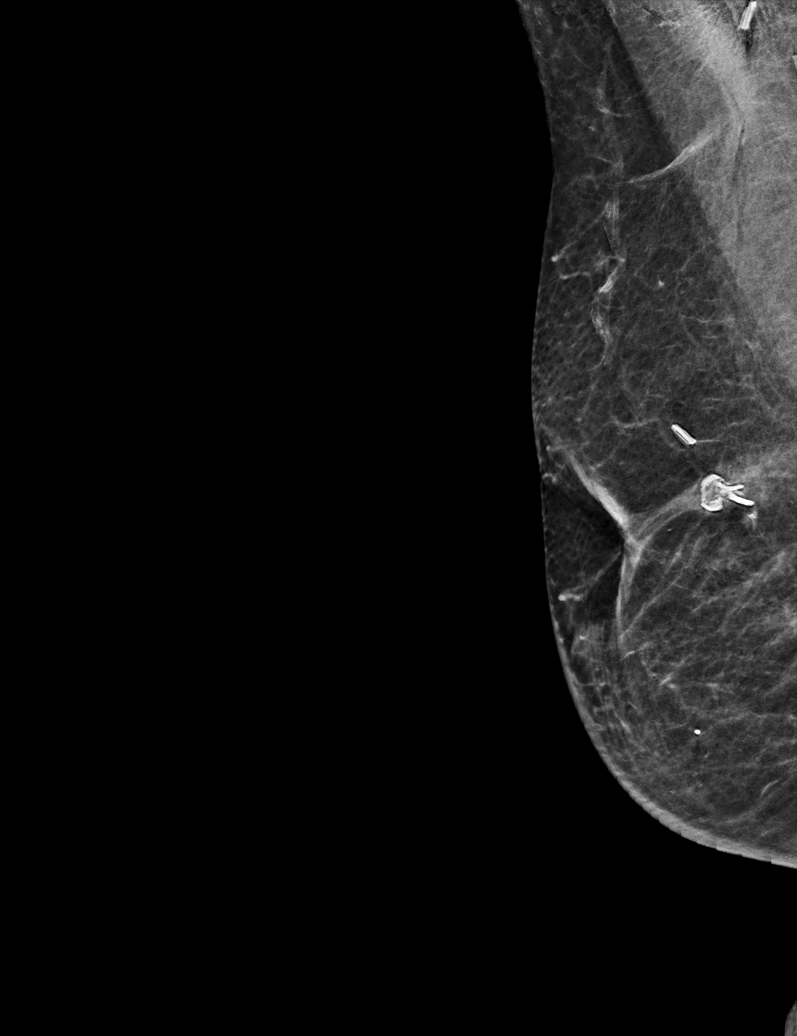

[6 of 36 positions shown; findings below may reference images not displayed]

ACR Breast Density Category b: There are scattered areas of
fibroglandular density.
FINDINGS: There are no findings suspicious for malignancy. Images were
processed with CAD.
IMPRESSION: No mammographic evidence of malignancy. A result letter of this
screening mammogram will be mailed directly to the patient.

RECOMMENDATION:
Screening mammogram in one year. (Code:[id number])

BI-RADS CATEGORY  1: Negative.

## 2020-05-14 ENCOUNTER — Ambulatory Visit: Payer: Medicare Other | Admitting: Internal Medicine

## 2020-06-02 ENCOUNTER — Ambulatory Visit (INDEPENDENT_AMBULATORY_CARE_PROVIDER_SITE_OTHER): Payer: Medicare Other | Admitting: Internal Medicine

## 2020-06-02 ENCOUNTER — Encounter: Payer: Self-pay | Admitting: Internal Medicine

## 2020-06-02 ENCOUNTER — Other Ambulatory Visit: Payer: Self-pay

## 2020-06-02 VITALS — BP 118/80 | HR 70 | Temp 97.4°F | Ht 66.5 in | Wt 128.0 lb

## 2020-06-02 DIAGNOSIS — L570 Actinic keratosis: Secondary | ICD-10-CM

## 2020-06-02 DIAGNOSIS — I10 Essential (primary) hypertension: Secondary | ICD-10-CM | POA: Diagnosis not present

## 2020-06-02 DIAGNOSIS — H6123 Impacted cerumen, bilateral: Secondary | ICD-10-CM

## 2020-06-02 DIAGNOSIS — E039 Hypothyroidism, unspecified: Secondary | ICD-10-CM | POA: Diagnosis not present

## 2020-06-02 DIAGNOSIS — E559 Vitamin D deficiency, unspecified: Secondary | ICD-10-CM | POA: Diagnosis not present

## 2020-06-02 DIAGNOSIS — E785 Hyperlipidemia, unspecified: Secondary | ICD-10-CM

## 2020-06-02 DIAGNOSIS — Z23 Encounter for immunization: Secondary | ICD-10-CM

## 2020-06-02 DIAGNOSIS — N3000 Acute cystitis without hematuria: Secondary | ICD-10-CM | POA: Diagnosis not present

## 2020-06-02 DIAGNOSIS — F0280 Dementia in other diseases classified elsewhere without behavioral disturbance: Secondary | ICD-10-CM

## 2020-06-02 DIAGNOSIS — G301 Alzheimer's disease with late onset: Secondary | ICD-10-CM

## 2020-06-02 MED ORDER — TETANUS-DIPHTH-ACELL PERTUSSIS 5-2.5-18.5 LF-MCG/0.5 IM SUSP
0.5000 mL | Freq: Once | INTRAMUSCULAR | 0 refills | Status: AC
Start: 2020-06-02 — End: 2020-06-02

## 2020-06-02 MED ORDER — AMLODIPINE BESY-BENAZEPRIL HCL 10-40 MG PO CAPS: 1.0000 | ORAL_CAPSULE | Freq: Every day | ORAL | 3 refills | Status: DC

## 2020-06-02 NOTE — Patient Instructions (Addendum)
cetaphil or cerave creams for dry skin over the counter   Consider 4th covid vaccine pfizer as soon as possible Consider Tdap vaccine for tetanus and whooping cough  -space shots out by 1 month covid and Tdap   Call [facility] to get ears cleaned  Consider eye exam   Reschedule labs and please be fasting 8-12 hours before labs     Contusion A contusion is a deep bruise. This is a result of an injury that causes bleeding under the skin. Symptoms of bruising include pain, swelling, and discolored skin. The skin may turn blue, purple, or yellow. Follow these instructions at home: Managing pain, stiffness, and swelling You may use RICE. This stands for:  Resting.  Icing.  Compression, or putting pressure.  Elevating, or raising the injured area. To follow this method, do these actions:  Rest the injured area.  If told, put ice on the injured area. ? Put ice in a plastic bag. ? Place a towel between your skin and the bag. ? Leave the ice on for 20 minutes, 2-3 times per day.  If told, put light pressure (compression) on the injured area using an elastic bandage. Make sure the bandage is not too tight. If the area tingles or becomes numb, remove it and put it back on as told by your doctor.  If possible, raise (elevate) the injured area above the level of your heart while you are sitting or lying down.   General instructions  Take over-the-counter and prescription medicines only as told by your doctor.  Keep all follow-up visits as told by your doctor. This is important. Contact a doctor if:  Your symptoms do not get better after several days of treatment.  Your symptoms get worse.  You have trouble moving the injured area. Get help right away if:  You have very bad pain.  You have a loss of feeling (numbness) in a hand or foot.  Your hand or foot turns pale or cold. Summary  A contusion is a deep bruise. This is a result of an injury that causes bleeding  under the skin.  Symptoms of bruising include pain, swelling, and discolored skin. The skin may turn blue, purple, or yellow.  This condition is treated with rest, ice, compression, and elevation. This is also called RICE. You may be given over-the-counter medicines for pain.  Contact a doctor if you do not feel better, or you feel worse. Get help right away if you have very bad pain, have lost feeling in a hand or foot, or the area turns pale or cold. This information is not intended to replace advice given to you by your health care provider. Make sure you discuss any questions you have with your health care provider. Document Revised: 09/01/2017 Document Reviewed: 09/01/2017 Elsevier Patient Education  [place].

## 2020-06-02 NOTE — Progress Notes (Incomplete)
Chief Complaint  Patient presents with  . Follow-up   HPI ROS Past Medical History:  Diagnosis Date  . Breast cancer ([place])    02/17/10: right lumpectomy reveals 0.8 cm of invasive ductal carcinoma with 1/3 LN+ for isolated tumor cell cluster in Oncotype recurrent score 19 or 12% chance of distant recurrence; Femara self-discontinued secondary to side effects. She continues to decline further therapy  . Chronic hyponatremia    Hyponatremia for which she has had since somewhere in the mid 1990's. She's had multiple hospitalization, one hospitalization in 1993, she was diagnosed with an EF of 25% as well, though coronaries were clean during that hospitalization. The cath showed an EF of 50%. She is being worked up for her hyponatremia at [facility].  . Dementia ([place])   . Dizziness   . Fibromuscular dysplasia of renal artery ([place])    1997 Renal angioplasty at [facility]. She subsequently underwent repeat angioplasty a few years later. 12/2009 Abdominal Duplex: <60% bilateral RAS, Sequential narrowing and dilations of renal arteries, suggests fibromuscular dysplasia.   . H/O electrolyte imbalance   . Hearing loss   . Hemorrhoid   . History of radiation therapy 2011  . Hypertension   . Hypothyroidism   . Ingrowing nail, left great toe   . Non-melanoma skin cancer    right hand  . Otalgia of right ear 11/10/2011  . Polycythemia   . Skin cancer    squamous cell skin cancer removed by Dermatology--arms and hands  . Sleep apnea   . UTI (urinary tract infection)    Past Surgical History:  Procedure Laterality Date  . ANGIOPLASTY OF RIGHT RENAL ARTERY  05/04/1995  . APPENDECTOMY    . BREAST LUMPECTOMY Right 2012   performed [facility]   . core biopsy breast Right 2012   ultrasound guided core needle biopsy of the right breast reveals invasive ductal carcinoma, ER/PR positive, HER2 neu   . FRACTURE SURGERY  2010   right   Family History  Problem Relation Age of Onset  . Hypertension Mother   .  Cancer Mother        ? type lived 69   . Alcohol abuse Father        died 38   . Cirrhosis Father   . Hypertension Daughter        [name]  . Cirrhosis Brother        a lot of health problems died 61s-70s   . Hyperlipidemia Son    Social History   Socioeconomic History  . Marital status: Married    Spouse name: Not on file  . Number of children: Not on file  . Years of education: Not on file  . Highest education level: Not on file  Occupational History  . Not on file  Tobacco Use  . Smoking status: Former Smoker    Packs/day: 0.25    Years: 20.00    Pack years: 5.00    Types: Cigarettes  . Smokeless tobacco: Former Systems developer    Quit date: 05/30/1976  . Tobacco comment: early teens; early college smoking Quit: 05/30/1976  Substance and Sexual Activity  . Alcohol use: Yes    Alcohol/week: 0.0 standard drinks    Comment: occasional social drinker  . Drug use: No  . Sexual activity: Yes    Partners: Male  Other Topics Concern  . Not on file  Social History Narrative   Married x 59 years as of 09/2018    3 kids 1  daughter and 2 sons 5 grands    Homemaker    DPR   1. Husband John [phone]; [phone]   2. Libby [phone]) [phone]    From [place]   Social Determinants of Health   Financial Resource Strain: Not on file  Food Insecurity: Not on file  Transportation Needs: Not on file  Physical Activity: Not on file  Stress: Not on file  Social Connections: Not on file  Intimate Partner Violence: Not on file   Current Meds  Medication Sig  . amLODipine-benazepril (LOTREL) 10-40 MG capsule Take 1 capsule by mouth daily.  Marland Kitchen aspirin 81 MG tablet Take 81 mg by mouth daily.  Marland Kitchen atorvastatin (LIPITOR) 10 MG tablet TAKE ONE TABLET BY MOUTH EVERY DAY AT 6PM  . Cholecalciferol (VITAMIN D3) 50 MCG (2000 UT) TABS Take 4,000 Units by mouth daily.  Marland Kitchen levothyroxine (SYNTHROID) 125 MCG tablet TAKE 1 TABLET BY MOUTH DAILY BEFORE BREAKFAST  . rivastigmine (EXELON) 9.5 mg/24hr   .  vitamin B-12 (CYANOCOBALAMIN) 1000 MCG tablet Take by mouth.   Allergies  Allergen Reactions  . Aricept [Donepezil Hcl]     GI upset    No results found for this or any previous visit (from the past 2160 hour(s)). Objective  Body mass index is 20.35 kg/m. Wt Readings from Last 3 Encounters:  06/02/20 128 lb (58.1 kg)  11/14/19 133 lb 9.6 oz (60.6 kg)  08/07/19 133 lb 9.6 oz (60.6 kg)   Temp Readings from Last 3 Encounters:  06/02/20 (!) 97.4 F (36.3 C) (Oral)  08/07/19 97.9 F (36.6 C) (Oral)  07/17/19 (!) 100.5 F (38.1 C) (Oral)   BP Readings from Last 3 Encounters:  06/02/20 118/80  08/07/19 122/78  07/17/19 (!) 144/94   Pulse Readings from Last 3 Encounters:  06/02/20 70  08/07/19 77  07/17/19 83    Physical Exam  Assessment  Plan  No diagnosis found.  HM utd flu total care, prevnar, pna 23utd shingrix had 2/2 covid vx 3/3 Consider Tdap in future Rx pharmacy   Out of age window pap, mammogram (last 03/06/15 negative diag b/l pt does have h/o breast cancer), colonoscopy 01/03/2019 mammo normal DEXA 11/06/17 osteopenia lumbar not reported 2/2 scoliosis or sclerosis  -rec calcium 600 mg qd and D3 4000 IU daily  Dentist declines for now  Dermatologyseen in 2021 spring for tx nmsc right hand and Aks  Provider: Dr. Olivia Mackie McLean-Scocuzza-Internal Medicine

## 2020-06-03 LAB — MICROSCOPIC EXAMINATION
Bacteria, UA: NONE SEEN
Casts: NONE SEEN /lpf
RBC, Urine: NONE SEEN /hpf (ref 0–2)

## 2020-06-03 LAB — URINALYSIS, ROUTINE W REFLEX MICROSCOPIC
Bilirubin, UA: NEGATIVE
Glucose, UA: NEGATIVE
Ketones, UA: NEGATIVE
Nitrite, UA: NEGATIVE
Protein,UA: NEGATIVE
RBC, UA: NEGATIVE
Specific Gravity, UA: 1.018 (ref 1.005–1.030)
Urobilinogen, Ur: 0.2 mg/dL (ref 0.2–1.0)
pH, UA: 6 (ref 5.0–7.5)

## 2020-06-05 LAB — URINE CULTURE

## 2020-06-09 DIAGNOSIS — H6123 Impacted cerumen, bilateral: Secondary | ICD-10-CM | POA: Insufficient documentation

## 2020-06-09 HISTORY — DX: Impacted cerumen, bilateral: H61.23

## 2020-06-09 NOTE — Progress Notes (Signed)
Chief Complaint  Patient presents with  . Follow-up   F/u alone today without daughter or husband  1. HTN controlled on lotrel 10-40 mg qd/hld on lipitor 10 mg qd  2. Hypothyroidism on levo 125 mcg qd  3. Recurrent UTI will check urine today   No complaints    ROS Past Medical History:  Diagnosis Date  . Breast cancer ([place])    02/17/10: right lumpectomy reveals 0.8 cm of invasive ductal carcinoma with 1/3 LN+ for isolated tumor cell cluster in Oncotype recurrent score 19 or 12% chance of distant recurrence; Femara self-discontinued secondary to side effects. She continues to decline further therapy  . Chronic hyponatremia    Hyponatremia for which she has had since somewhere in the mid 1990's. She's had multiple hospitalization, one hospitalization in 1993, she was diagnosed with an EF of 25% as well, though coronaries were clean during that hospitalization. The cath showed an EF of 50%. She is being worked up for her hyponatremia at [facility].  . Dementia ([place])   . Dizziness   . Fibromuscular dysplasia of renal artery ([place])    1997 Renal angioplasty at [facility]. She subsequently underwent repeat angioplasty a few years later. 12/2009 Abdominal Duplex: <60% bilateral RAS, Sequential narrowing and dilations of renal arteries, suggests fibromuscular dysplasia.   . H/O electrolyte imbalance   . Hearing loss   . Hemorrhoid   . History of radiation therapy 2011  . Hypertension   . Hypothyroidism   . Ingrowing nail, left great toe   . Non-melanoma skin cancer    right hand  . Otalgia of right ear 11/10/2011  . Polycythemia   . Skin cancer    squamous cell skin cancer removed by Dermatology--arms and hands  . Sleep apnea   . UTI (urinary tract infection)    Past Surgical History:  Procedure Laterality Date  . ANGIOPLASTY OF RIGHT RENAL ARTERY  05/04/1995  . APPENDECTOMY    . BREAST LUMPECTOMY Right 2012   performed [facility]   . core biopsy breast Right 2012   ultrasound guided core  needle biopsy of the right breast reveals invasive ductal carcinoma, ER/PR positive, HER2 neu   . FRACTURE SURGERY  2010   right   Family History  Problem Relation Age of Onset  . Hypertension Mother   . Cancer Mother        ? type lived 78   . Alcohol abuse Father        died 17   . Cirrhosis Father   . Hypertension Daughter        [name]  . Cirrhosis Brother        a lot of health problems died 65s-70s   . Hyperlipidemia Son    Social History   Socioeconomic History  . Marital status: Married    Spouse name: Not on file  . Number of children: Not on file  . Years of education: Not on file  . Highest education level: Not on file  Occupational History  . Not on file  Tobacco Use  . Smoking status: Former Smoker    Packs/day: 0.25    Years: 20.00    Pack years: 5.00    Types: Cigarettes  . Smokeless tobacco: Former Systems developer    Quit date: 05/30/1976  . Tobacco comment: early teens; early college smoking Quit: 05/30/1976  Substance and Sexual Activity  . Alcohol use: Yes    Alcohol/week: 0.0 standard drinks    Comment: occasional social drinker  .  Drug use: No  . Sexual activity: Yes    Partners: Male  Other Topics Concern  . Not on file  Social History Narrative   Married x 59 years as of 09/2018    3 kids 1 daughter and 2 sons 5 grands    Homemaker    DPR   1. Husband John [phone]; [phone]   2. Libby [phone]) [phone]    From [place]   Social Determinants of Health   Financial Resource Strain: Not on file  Food Insecurity: Not on file  Transportation Needs: Not on file  Physical Activity: Not on file  Stress: Not on file  Social Connections: Not on file  Intimate Partner Violence: Not on file   Current Meds  Medication Sig  . aspirin 81 MG tablet Take 81 mg by mouth daily.  Marland Kitchen atorvastatin (LIPITOR) 10 MG tablet TAKE ONE TABLET BY MOUTH EVERY DAY AT 6PM  . Cholecalciferol (VITAMIN D3) 50 MCG (2000 UT) TABS Take 4,000 Units by mouth daily.  Marland Kitchen  levothyroxine (SYNTHROID) 125 MCG tablet TAKE 1 TABLET BY MOUTH DAILY BEFORE BREAKFAST  . rivastigmine (EXELON) 9.5 mg/24hr   . [EXPIRED] Tdap (BOOSTRIX) 5-2.5-18.5 LF-MCG/0.5 injection Inject 0.5 mLs into the muscle once for 1 dose.  . vitamin B-12 (CYANOCOBALAMIN) 1000 MCG tablet Take by mouth.  . [DISCONTINUED] amLODipine-benazepril (LOTREL) 10-40 MG capsule Take 1 capsule by mouth daily.   Allergies  Allergen Reactions  . Aricept [Donepezil Hcl]     GI upset    Recent Results (from the past 2160 hour(s))  Urine Culture     Status: None   Collection Time: 06/02/20 12:11 PM   Specimen: Urine   Urine  Result Value Ref Range   Urine Culture, Routine Final report    Organism ID, Bacteria Comment     Comment: Mixed urogenital flora 10,000-25,000 colony forming units per mL   Urinalysis, Routine w reflex microscopic     Status: Abnormal   Collection Time: 06/02/20 12:11 PM  Result Value Ref Range   Specific Gravity, UA 1.018 1.005 - 1.030   pH, UA 6.0 5.0 - 7.5   Color, UA Yellow Yellow   Appearance Ur Clear Clear   Leukocytes,UA 1+ (A) Negative   Protein,UA Negative Negative/Trace   Glucose, UA Negative Negative   Ketones, UA Negative Negative   RBC, UA Negative Negative   Bilirubin, UA Negative Negative   Urobilinogen, Ur 0.2 0.2 - 1.0 mg/dL   Nitrite, UA Negative Negative   Microscopic Examination See below:     Comment: Microscopic was indicated and was performed.  Microscopic Examination     Status: None   Collection Time: 06/02/20 12:11 PM   Urine  Result Value Ref Range   WBC, UA 0-5 0 - 5 /hpf   RBC None seen 0 - 2 /hpf   Epithelial Cells (non renal) 0-10 0 - 10 /hpf   Casts None seen None seen /lpf   Bacteria, UA None seen None seen/Few   Objective  Body mass index is 20.35 kg/m. Wt Readings from Last 3 Encounters:  06/02/20 128 lb (58.1 kg)  11/14/19 133 lb 9.6 oz (60.6 kg)  08/07/19 133 lb 9.6 oz (60.6 kg)   Temp Readings from Last 3 Encounters:   06/02/20 (!) 97.4 F (36.3 C) (Oral)  08/07/19 97.9 F (36.6 C) (Oral)  07/17/19 (!) 100.5 F (38.1 C) (Oral)   BP Readings from Last 3 Encounters:  06/02/20 118/80  08/07/19  122/78  07/17/19 (!) 144/94   Pulse Readings from Last 3 Encounters:  06/02/20 70  08/07/19 77  07/17/19 83    Physical Exam Vitals and nursing note reviewed.  Constitutional:      Appearance: Normal appearance. She is well-developed and well-groomed.  HENT:     Head: Normocephalic and atraumatic.     Right Ear: There is impacted cerumen.     Left Ear: There is impacted cerumen.  Cardiovascular:     Rate and Rhythm: Normal rate and regular rhythm.     Heart sounds: Normal heart sounds. No murmur heard.   Pulmonary:     Effort: Pulmonary effort is normal.     Breath sounds: Normal breath sounds.  Abdominal:     Tenderness: There is no abdominal tenderness.  Skin:    General: Skin is warm and dry.  Neurological:     General: No focal deficit present.     Mental Status: She is alert and oriented to person, place, and time. Mental status is at baseline.     Gait: Gait normal.  Psychiatric:        Attention and Perception: Attention and perception normal.        Mood and Affect: Mood and affect normal.        Speech: Speech normal.        Behavior: Behavior normal. Behavior is cooperative.        Thought Content: Thought content normal.        Cognition and Memory: Cognition and memory normal.        Judgment: Judgment normal.     Assessment  Plan  Hypertension, controlled lotrel 10-40 mg qd- Plan: Comprehensive metabolic panel, Lipid panel, CBC w/Diff  Vitamin D deficiency - Plan: Vitamin D (25 hydroxy)  Hypothyroidism, unspecified type - Plan: TSH On levo 125 mg qd controlled   Acute cystitis without hematuria -recurrent check urine today  Hyperlipidemia, unspecified hyperlipidemia type On lipitor 10 mg qhs   Actinic keratoses b/l hands and forearms  Declines dermatology for now    Increased cerumen b/l ears  Call eNT and get appt  For cleaning pt pt established   HM utd flu total care, prevnar, pna 23utd shingrix had 2/2 covid vx 3/3 consider booster  Consider Tdap in future Rx pharmacy   Out of age window pap, mammogram (last 03/06/15 negative diag b/l pt does have h/o breast cancer), colonoscopy 01/03/2019 mammo normal DEXA 11/06/17 osteopenia lumbar not reported 2/2 scoliosis or sclerosis  -rec calcium 600 mg qd and D3 4000 IU daily  Dentist declines for now  Dermatologyseen in 2021 spring for tx nmsc right hand and Aks as of 06/02/20 declines further derm   Provider: Dr. Olivia Mackie McLean-Scocuzza-Internal Medicine

## 2020-06-10 ENCOUNTER — Other Ambulatory Visit: Payer: Self-pay

## 2020-06-10 ENCOUNTER — Other Ambulatory Visit (INDEPENDENT_AMBULATORY_CARE_PROVIDER_SITE_OTHER): Payer: Medicare Other

## 2020-06-10 DIAGNOSIS — I10 Essential (primary) hypertension: Secondary | ICD-10-CM

## 2020-06-10 DIAGNOSIS — E039 Hypothyroidism, unspecified: Secondary | ICD-10-CM | POA: Diagnosis not present

## 2020-06-10 DIAGNOSIS — E559 Vitamin D deficiency, unspecified: Secondary | ICD-10-CM

## 2020-06-10 LAB — COMPREHENSIVE METABOLIC PANEL
ALT: 27 U/L (ref 0–35)
AST: 26 U/L (ref 0–37)
Albumin: 4.6 g/dL (ref 3.5–5.2)
Alkaline Phosphatase: 63 U/L (ref 39–117)
BUN: 18 mg/dL (ref 6–23)
CO2: 28 mEq/L (ref 19–32)
Calcium: 10 mg/dL (ref 8.4–10.5)
Chloride: 99 mEq/L (ref 96–112)
Creatinine, Ser: 0.72 mg/dL (ref 0.40–1.20)
GFR: 77.57 mL/min (ref >60.00)
Glucose, Bld: 96 mg/dL (ref 70–99)
Potassium: 4.2 mEq/L (ref 3.5–5.1)
Sodium: 135 mEq/L (ref 135–145)
Total Bilirubin: 1 mg/dL (ref 0.2–1.2)
Total Protein: 7.2 g/dL (ref 6.0–8.3)

## 2020-06-10 LAB — CBC WITH DIFFERENTIAL/PLATELET
Basophils Absolute: 0 10*3/uL (ref 0.0–0.1)
Basophils Relative: 0.6 % (ref 0.0–3.0)
Eosinophils Absolute: 0.1 10*3/uL (ref 0.0–0.7)
Eosinophils Relative: 2.2 % (ref 0.0–5.0)
HCT: 43.2 % (ref 36.0–46.0)
Hemoglobin: 14.9 g/dL (ref 12.0–15.0)
Lymphocytes Relative: 39.6 % (ref 12.0–46.0)
Lymphs Abs: 2 10*3/uL (ref 0.7–4.0)
MCHC: 34.4 g/dL (ref 30.0–36.0)
MCV: 96.4 fl (ref 78.0–100.0)
Monocytes Absolute: 0.5 10*3/uL (ref 0.1–1.0)
Monocytes Relative: 9.6 % (ref 3.0–12.0)
Neutro Abs: 2.5 10*3/uL (ref 1.4–7.7)
Neutrophils Relative %: 48 % (ref 43.0–77.0)
Platelets: 207 10*3/uL (ref 150.0–400.0)
RBC: 4.48 Mil/uL (ref 3.87–5.11)
RDW: 13.8 % (ref 11.5–15.5)
WBC: 5.1 10*3/uL (ref 4.0–10.5)

## 2020-06-10 LAB — LIPID PANEL
Cholesterol: 208 mg/dL — ABNORMAL HIGH (ref 0–200)
HDL: 105.5 mg/dL (ref >39.00)
LDL Cholesterol: 90 mg/dL (ref 0–99)
NonHDL: 102.23
Total CHOL/HDL Ratio: 2
Triglycerides: 60 mg/dL (ref 0.0–149.0)
VLDL: 12 mg/dL (ref 0.0–40.0)

## 2020-06-10 LAB — VITAMIN D 25 HYDROXY (VIT D DEFICIENCY, FRACTURES): VITD: 29.13 ng/mL — ABNORMAL LOW (ref 30.00–100.00)

## 2020-06-10 LAB — TSH: TSH: 3.42 u[IU]/mL (ref 0.35–4.50)

## 2020-10-06 ENCOUNTER — Telehealth: Payer: Self-pay | Admitting: Internal Medicine

## 2020-10-06 NOTE — Telephone Encounter (Signed)
Patient's daughter [name] called, [phone]. She is requesting another round of physical therapy for patient's balance, Through [place] care.

## 2020-10-07 NOTE — Telephone Encounter (Signed)
It has been longer than a year since patient has received PT. Advised that patient will need visit in order to place referral. Scheduled with Dr Caryl Bis 10/5. Patients daughter would like to do this by video. Also, daughter stated that they have lost DNR but they have their MOST form. Is this something that you can sign for them or would you prefer them to wait to discuss with PCP at November visit

## 2020-10-07 NOTE — Telephone Encounter (Signed)
I can certainly discuss the DNR form with them when they have their visit.  I believe that insurance will want this to be in person given that we are likely going to order home health physical therapy.  Is there any way they can make it an in person visit?

## 2020-10-09 NOTE — Telephone Encounter (Signed)
Visit has been changed to an in office visit.

## 2020-10-09 NOTE — Telephone Encounter (Signed)
Left message on daughter phone and on home phone to call office.

## 2020-10-09 NOTE — Telephone Encounter (Signed)
Husband called and confirmed appointment for October. He was advised that patient needs to sign a DPR.

## 2020-10-28 ENCOUNTER — Other Ambulatory Visit: Payer: Self-pay

## 2020-10-28 ENCOUNTER — Encounter: Payer: Self-pay | Admitting: Family Medicine

## 2020-10-28 ENCOUNTER — Ambulatory Visit (INDEPENDENT_AMBULATORY_CARE_PROVIDER_SITE_OTHER): Payer: Medicare Other | Admitting: Family Medicine

## 2020-10-28 VITALS — BP 120/78 | HR 74 | Temp 98.7°F | Ht 66.0 in | Wt 121.6 lb

## 2020-10-28 DIAGNOSIS — F0280 Dementia in other diseases classified elsewhere without behavioral disturbance: Secondary | ICD-10-CM | POA: Diagnosis not present

## 2020-10-28 DIAGNOSIS — M25571 Pain in right ankle and joints of right foot: Secondary | ICD-10-CM

## 2020-10-28 DIAGNOSIS — R2689 Other abnormalities of gait and mobility: Secondary | ICD-10-CM | POA: Diagnosis not present

## 2020-10-28 DIAGNOSIS — G308 Other Alzheimer's disease: Secondary | ICD-10-CM

## 2020-10-28 DIAGNOSIS — G8929 Other chronic pain: Secondary | ICD-10-CM

## 2020-10-28 HISTORY — DX: Pain in right ankle and joints of right foot: M25.571

## 2020-10-28 NOTE — Progress Notes (Signed)
Tommi Rumps, MD Phone: [phone]  Laura Carpenter is a 85 y.o. female who presents today for f/u.  Balance issues: this is an ongoing issue. She reports decreased strength in her legs on getting up from a seated position.  She has become progressively unsteady over time.  She does use a cane a lot.  She reports a broken ankle at least 40 years ago with some screws in her right ankle.  Reports the screws are loosening.  Her daughter provides a lot of the history.  Patient notes her ankle does hurt at times after she walks for a while.  Dementia: Patient sees neurology.  She takes Exelon.  This seems to be generally stable.  DNR discussion: The patient presents for new DNR forms.  They lost their prior DNR forms.  I discussed that having a DNR indicates that in the event of a cardiac or pulmonary arrest of the patient there will be no cardiopulmonary resuscitation attempted.  She verbalized understanding and wants to proceed with DNR.  I advised that if her mind changes at any point she can let us know.  Social History   Tobacco Use  Smoking Status Former   Packs/day: 0.25   Years: 20.00   Pack years: 5.00   Types: Cigarettes  Smokeless Tobacco Former   Quit date: 05/30/1976  Tobacco Comments   early teens; early college smoking Quit: 05/30/1976    Current Outpatient Medications on File Prior to Visit  Medication Sig Dispense Refill   amLODipine-benazepril (LOTREL) 10-40 MG capsule Take 1 capsule by mouth daily. 90 capsule 3   aspirin 81 MG tablet Take 81 mg by mouth daily.     atorvastatin (LIPITOR) 10 MG tablet TAKE ONE TABLET BY MOUTH EVERY DAY AT 6PM 90 tablet 3   Cholecalciferol (VITAMIN D3) 50 MCG (2000 UT) TABS Take 4,000 Units by mouth daily.     levothyroxine (SYNTHROID) 125 MCG tablet TAKE 1 TABLET BY MOUTH DAILY BEFORE BREAKFAST 90 tablet 3   rivastigmine (EXELON) 9.5 mg/24hr      vitamin B-12 (CYANOCOBALAMIN) 1000 MCG tablet Take by mouth.     No current  facility-administered medications on file prior to visit.     ROS see history of present illness  Objective  Physical Exam Vitals:   10/28/20 1427  BP: 120/78  Pulse: 74  Temp: 98.7 F (37.1 C)  SpO2: 97%    BP Readings from Last 3 Encounters:  10/28/20 120/78  06/02/20 118/80  08/07/19 122/78   Wt Readings from Last 3 Encounters:  10/28/20 121 lb 9.6 oz (55.2 kg)  06/02/20 128 lb (58.1 kg)  11/14/19 133 lb 9.6 oz (60.6 kg)    Physical Exam Constitutional:      General: She is not in acute distress.    Appearance: She is not diaphoretic.  Cardiovascular:     Rate and Rhythm: Normal rate and regular rhythm.     Heart sounds: Normal heart sounds.  Pulmonary:     Effort: Pulmonary effort is normal.     Breath sounds: Normal breath sounds.  Skin:    General: Skin is warm and dry.  Neurological:     Mental Status: She is alert.     Comments: 5/5 strength in bilateral biceps, triceps, grip, quads, hamstrings, plantar and dorsiflexion, sensation to light touch intact in bilateral UE and LE, slow steady gait     Assessment/Plan: Please see individual problem list.  Problem List Items Addressed This Visit  Balance problem - Primary    This is an ongoing issue.  The patient would benefit from home health physical therapy to help strengthen her legs and work on her balance.  Order placed.      Relevant Orders   Ambulatory referral to [place]   Dementia [facility])    Relatively stable.  She will continue to see neurology.      Right ankle pain    This is a chronic issue.  Likely related to arthritis and difficulty with her prior hardware.  I encouraged an x-ray today though she declines.  They will monitor.        Return for As scheduled with PCP.  This visit occurred during the SARS-CoV-2 public health emergency.  Safety protocols were in place, including screening questions prior to the visit, additional usage of staff PPE, and extensive cleaning of exam  room while observing appropriate contact time as indicated for disinfecting solutions.    Tommi Rumps, MD [place]

## 2020-10-28 NOTE — Patient Instructions (Signed)
Nice to see you. I have placed a referral for home health.  If you do not hear from them in the next several days please let us know.

## 2020-10-28 NOTE — Assessment & Plan Note (Signed)
This is an ongoing issue.  The patient would benefit from home health physical therapy to help strengthen her legs and work on her balance.  Order placed.

## 2020-10-28 NOTE — Assessment & Plan Note (Signed)
Relatively stable.  She will continue to see neurology.

## 2020-10-28 NOTE — Assessment & Plan Note (Signed)
This is a chronic issue.  Likely related to arthritis and difficulty with her prior hardware.  I encouraged an x-ray today though she declines.  They will monitor.

## 2020-11-16 IMAGING — US US ABDOMEN COMPLETE
1 series · 14 of 25 positions shown · non-contrast
Comparison: None

Correlation: CT abdomen and pelvis 10/24/2014

CLINICAL DATA: Epigastric abdominal pain, history breast cancer,
hypertension, dementia, former smoker

EXAM:
ABDOMEN ULTRASOUND COMPLETE

[Series 1: us abdomen complete · 0.12mm/px · 14 of 121 slices shown]
[im 1/121]
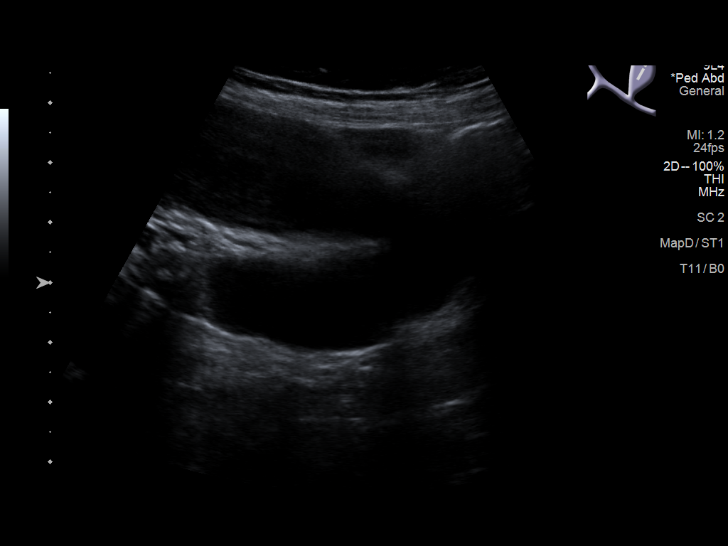
[im 11/121]
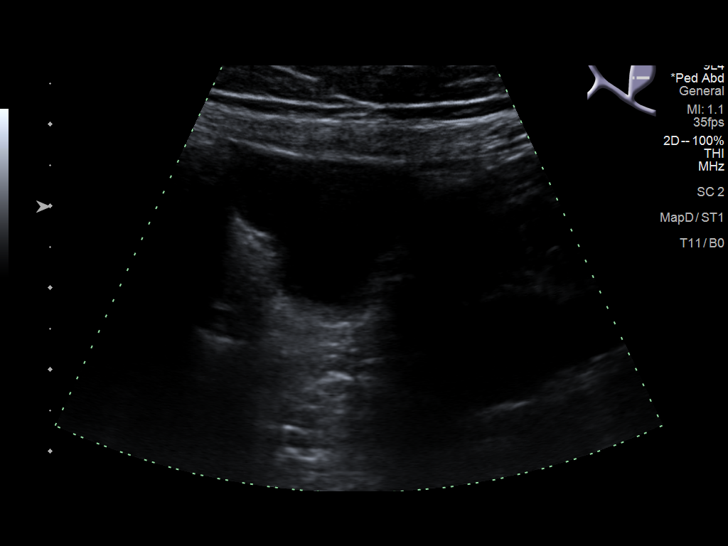
[im 21/121]
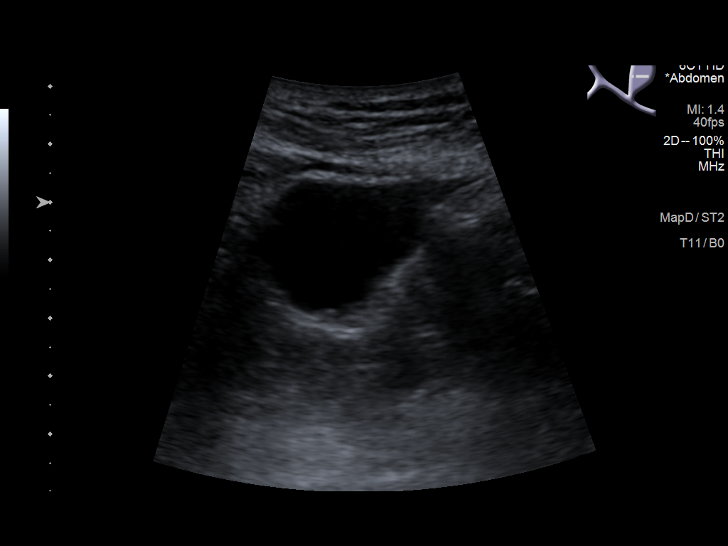
[im 31/121]
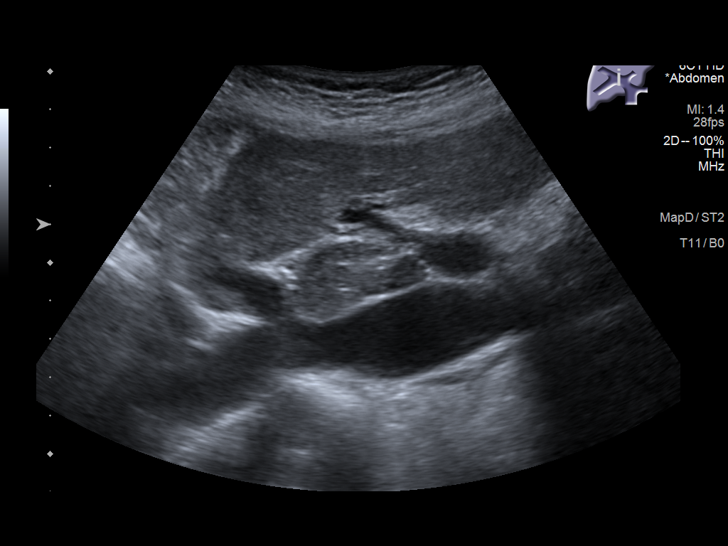
[im 41/121]
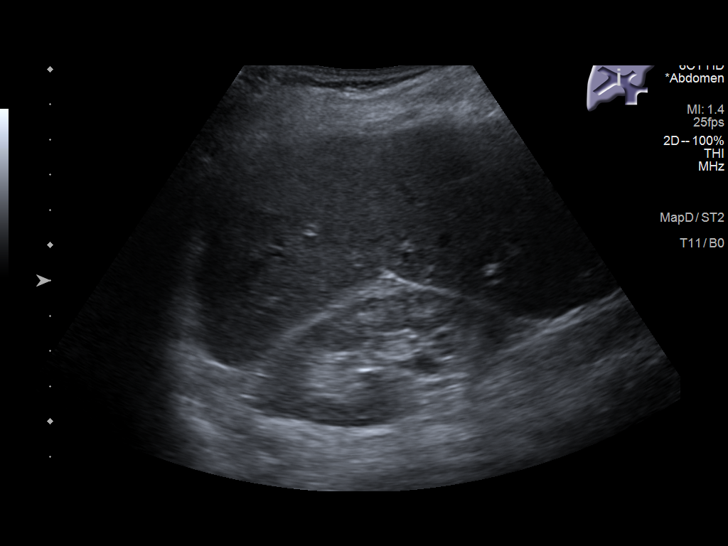
[im 46/121]
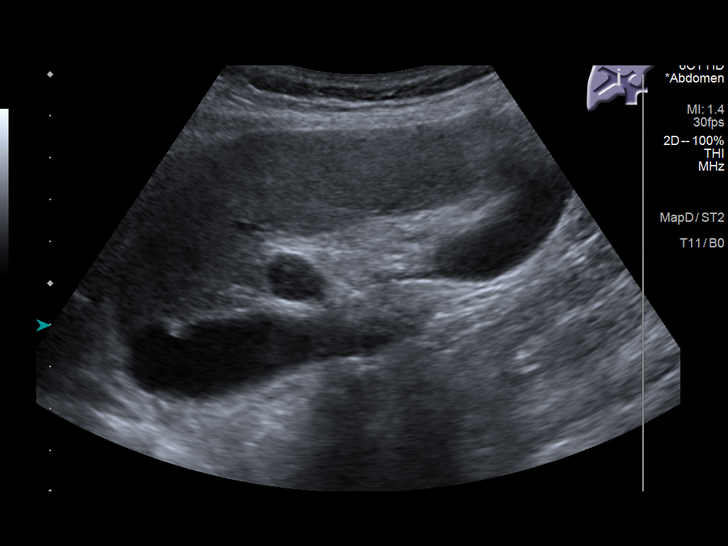
[im 56/121]
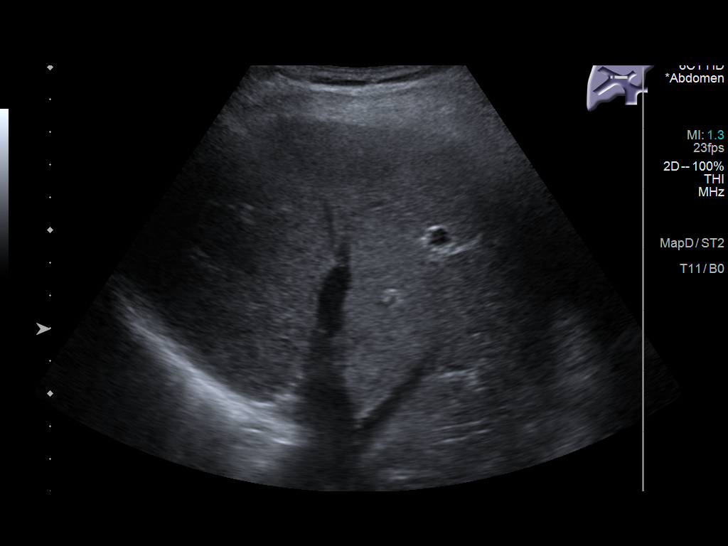
[im 66/121]
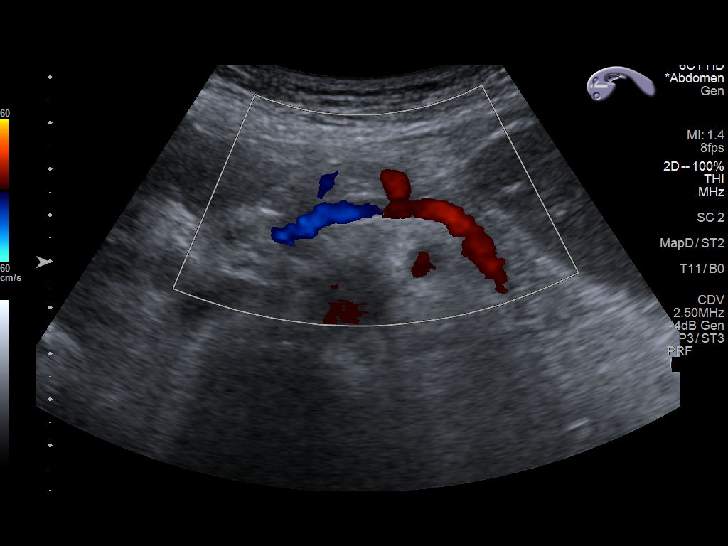
[im 76/121]
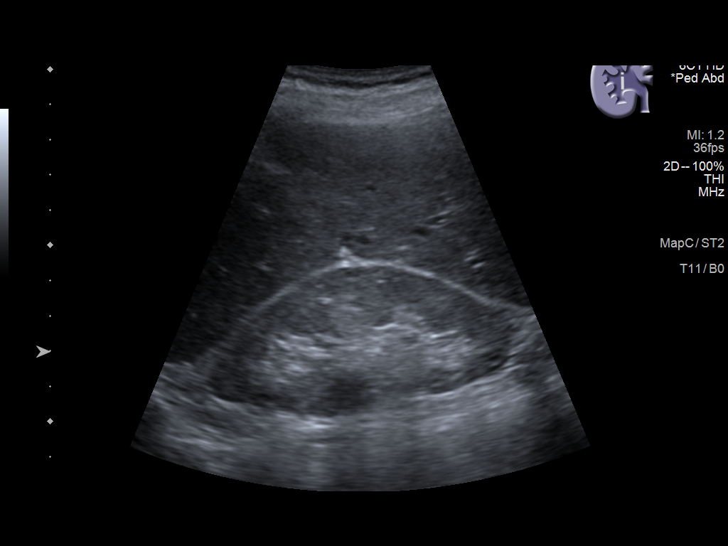
[im 81/121]
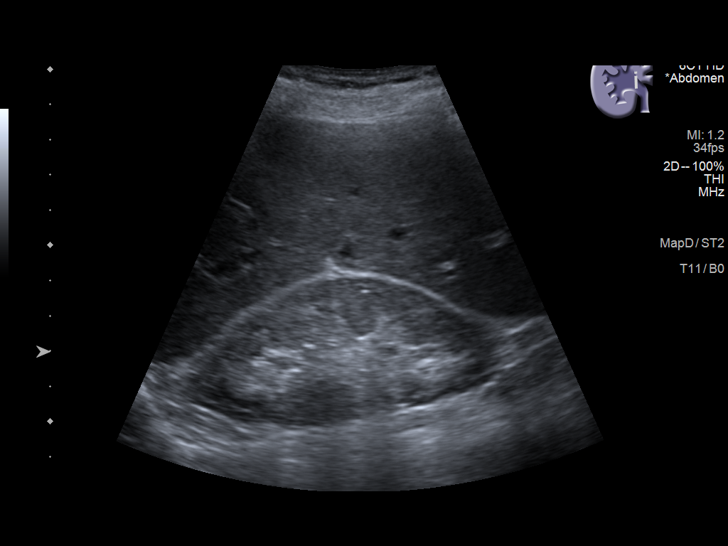
[im 91/121]
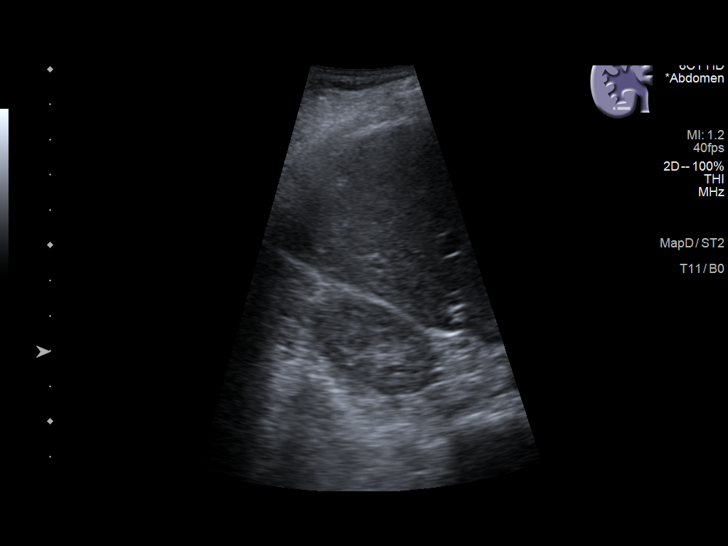
[im 101/121]
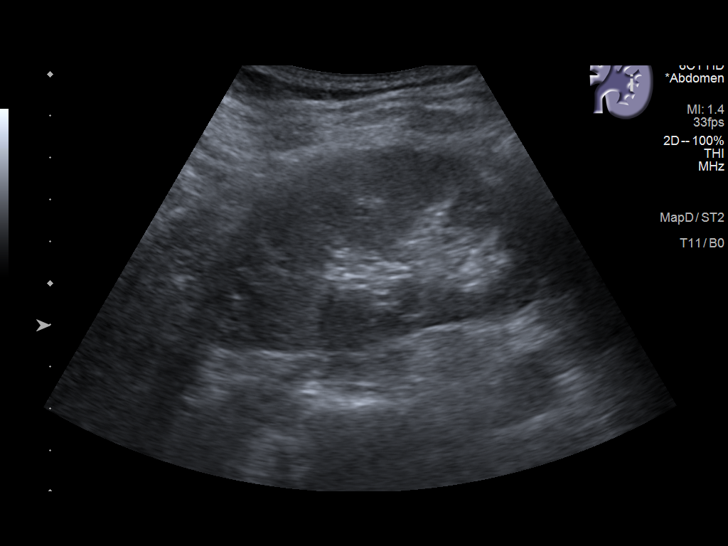
[im 111/121]
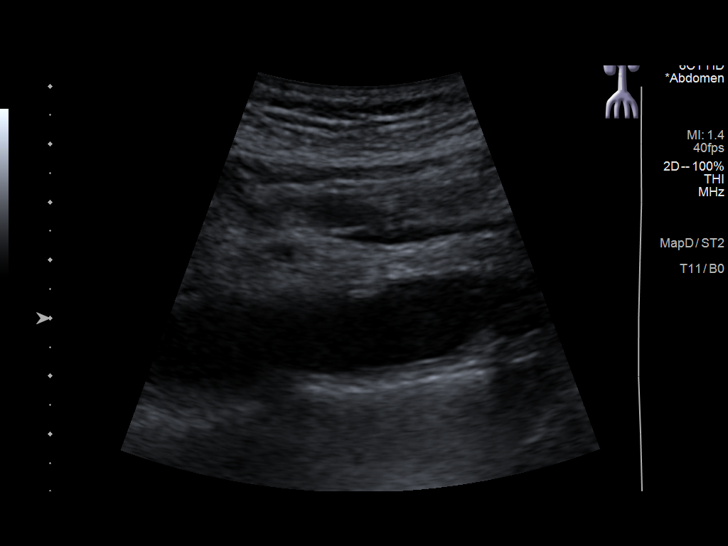
[im 121/121]
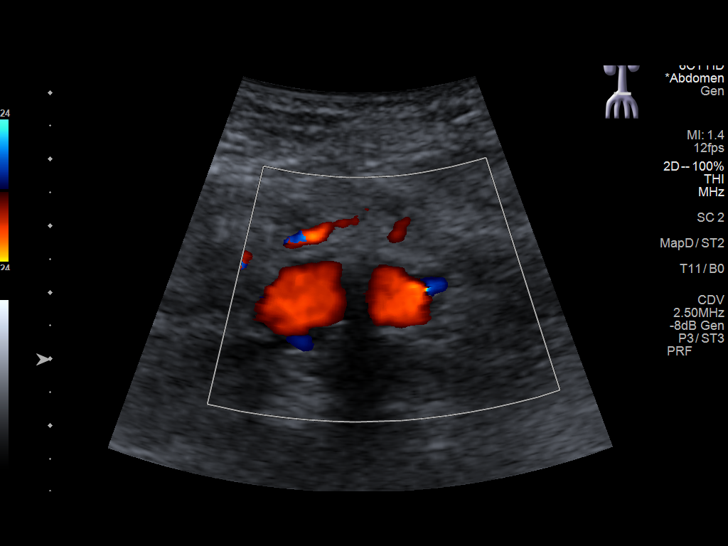

[14 of 25 positions shown; findings below may reference images not displayed]

FINDINGS: Gallbladder: Normally distended without stones or wall thickening.
No pericholecystic fluid or sonographic Murphy sign.

Common bile duct: Diameter: 2 mm, normal

Liver: Normal echogenicity without mass or nodularity. No
intrahepatic biliary dilatation. Portal vein is patent on color
Doppler imaging with normal direction of blood flow towards the
liver.

IVC: Normal appearance

Pancreas: Normal appearance

Spleen: Normal appearance, 6.7 cm length

Right Kidney: Length: 8.9 cm. Normal morphology without mass or
hydronephrosis.

Left Kidney: Length: 8.9 cm. Normal morphology without mass or
hydronephrosis.

Abdominal aorta: Mild atherosclerotic changes.  Normal caliber.

Other findings: No RIGHT upper quadrant free fluid.
IMPRESSION: Normal exam.

## 2020-11-18 ENCOUNTER — Telehealth: Payer: Self-pay | Admitting: Internal Medicine

## 2020-11-18 NOTE — Telephone Encounter (Signed)
[facility] called back in and spoke with front desk staff. [facility] did not give a Patient name asking for return call.   Called them back and they state they need a verbal to change Patient's focus of care to seizures, chronic pain, and the DX codes. Informed that this will need to be faxed so that the provider can sign. They will fax this over.

## 2020-11-18 NOTE — Telephone Encounter (Signed)
Tiffany from [facility] is calling in to confirm the diagnosis codes they have in for the patient.Please call her at [phone] opt.2.

## 2020-11-18 NOTE — Telephone Encounter (Signed)
Left message to return call 

## 2020-12-09 ENCOUNTER — Ambulatory Visit (INDEPENDENT_AMBULATORY_CARE_PROVIDER_SITE_OTHER): Payer: Medicare Other | Admitting: Internal Medicine

## 2020-12-09 ENCOUNTER — Other Ambulatory Visit: Payer: Self-pay

## 2020-12-09 ENCOUNTER — Telehealth: Payer: Self-pay | Admitting: *Deleted

## 2020-12-09 ENCOUNTER — Encounter: Payer: Self-pay | Admitting: Internal Medicine

## 2020-12-09 VITALS — BP 114/70 | HR 77 | Temp 97.2°F | Ht 66.0 in | Wt 119.4 lb

## 2020-12-09 DIAGNOSIS — E039 Hypothyroidism, unspecified: Secondary | ICD-10-CM | POA: Diagnosis not present

## 2020-12-09 DIAGNOSIS — N3000 Acute cystitis without hematuria: Secondary | ICD-10-CM | POA: Diagnosis not present

## 2020-12-09 DIAGNOSIS — E785 Hyperlipidemia, unspecified: Secondary | ICD-10-CM

## 2020-12-09 DIAGNOSIS — I10 Essential (primary) hypertension: Secondary | ICD-10-CM | POA: Diagnosis not present

## 2020-12-09 DIAGNOSIS — R634 Abnormal weight loss: Secondary | ICD-10-CM

## 2020-12-09 DIAGNOSIS — E559 Vitamin D deficiency, unspecified: Secondary | ICD-10-CM | POA: Diagnosis not present

## 2020-12-09 DIAGNOSIS — Z23 Encounter for immunization: Secondary | ICD-10-CM

## 2020-12-09 LAB — CBC WITH DIFFERENTIAL/PLATELET
Basophils Absolute: 0 10*3/uL (ref 0.0–0.1)
Basophils Relative: 0.5 % (ref 0.0–3.0)
Eosinophils Absolute: 0.1 10*3/uL (ref 0.0–0.7)
Eosinophils Relative: 1.4 % (ref 0.0–5.0)
HCT: 45.6 % (ref 36.0–46.0)
Hemoglobin: 15.3 g/dL — ABNORMAL HIGH (ref 12.0–15.0)
Lymphocytes Relative: 27.7 % (ref 12.0–46.0)
Lymphs Abs: 1.9 10*3/uL (ref 0.7–4.0)
MCHC: 33.6 g/dL (ref 30.0–36.0)
MCV: 97.5 fl (ref 78.0–100.0)
Monocytes Absolute: 0.6 10*3/uL (ref 0.1–1.0)
Monocytes Relative: 8.8 % (ref 3.0–12.0)
Neutro Abs: 4.3 10*3/uL (ref 1.4–7.7)
Neutrophils Relative %: 61.6 % (ref 43.0–77.0)
Platelets: 273 10*3/uL (ref 150.0–400.0)
RBC: 4.68 Mil/uL (ref 3.87–5.11)
RDW: 13.7 % (ref 11.5–15.5)
WBC: 7 10*3/uL (ref 4.0–10.5)

## 2020-12-09 LAB — LIPID PANEL
Cholesterol: 198 mg/dL (ref 0–200)
HDL: 100.6 mg/dL (ref >39.00)
LDL Cholesterol: 73 mg/dL (ref 0–99)
NonHDL: 97.85
Total CHOL/HDL Ratio: 2
Triglycerides: 124 mg/dL (ref 0.0–149.0)
VLDL: 24.8 mg/dL (ref 0.0–40.0)

## 2020-12-09 LAB — COMPREHENSIVE METABOLIC PANEL
ALT: 28 U/L (ref 0–35)
AST: 29 U/L (ref 0–37)
Albumin: 4.9 g/dL (ref 3.5–5.2)
Alkaline Phosphatase: 72 U/L (ref 39–117)
BUN: 26 mg/dL — ABNORMAL HIGH (ref 6–23)
CO2: 27 mEq/L (ref 19–32)
Calcium: 10.3 mg/dL (ref 8.4–10.5)
Chloride: 99 mEq/L (ref 96–112)
Creatinine, Ser: 0.94 mg/dL (ref 0.40–1.20)
GFR: 56.13 mL/min — ABNORMAL LOW (ref >60.00)
Glucose, Bld: 95 mg/dL (ref 70–99)
Potassium: 4 mEq/L (ref 3.5–5.1)
Sodium: 136 mEq/L (ref 135–145)
Total Bilirubin: 0.9 mg/dL (ref 0.2–1.2)
Total Protein: 7.7 g/dL (ref 6.0–8.3)

## 2020-12-09 LAB — TSH: TSH: 2.23 u[IU]/mL (ref 0.35–5.50)

## 2020-12-09 LAB — VITAMIN D 25 HYDROXY (VIT D DEFICIENCY, FRACTURES): VITD: 111.59 ng/mL (ref 30.00–100.00)

## 2020-12-09 MED ORDER — LEVOTHYROXINE SODIUM 125 MCG PO TABS
125.0000 ug | ORAL_TABLET | Freq: Every day | ORAL | 3 refills | Status: DC
Start: 2020-12-09 — End: 2022-02-17

## 2020-12-09 MED ORDER — ATORVASTATIN CALCIUM 10 MG PO TABS: ORAL_TABLET | ORAL | 3 refills | Status: DC

## 2020-12-09 MED ORDER — TETANUS-DIPHTH-ACELL PERTUSSIS 5-2.5-18.5 LF-MCG/0.5 IM SUSP: 0.5000 mL | Freq: Once | INTRAMUSCULAR | 0 refills | Status: AC

## 2020-12-09 MED ORDER — AMLODIPINE BESY-BENAZEPRIL HCL 10-40 MG PO CAPS
1.0000 | ORAL_CAPSULE | Freq: Every day | ORAL | 3 refills | Status: DC
Start: 2020-12-09 — End: 2022-01-05

## 2020-12-09 NOTE — Telephone Encounter (Signed)
Stop vitamin D for now due to excess vitamin D

## 2020-12-09 NOTE — Progress Notes (Signed)
Chief Complaint  Patient presents with   Follow-up   Annual 1. Htn controlled on lotrel 10-40 mg qd  2. Hld on 10 mg lipitor 3. Hypothyroidism 125 mcg qd   Review of Systems  Constitutional:  Negative for weight loss.  HENT:  Negative for hearing loss.   Eyes:  Negative for blurred vision.  Respiratory:  Negative for shortness of breath.   Cardiovascular:  Negative for chest pain.  Gastrointestinal:  Negative for abdominal pain and blood in stool.  Genitourinary:  Negative for dysuria.  Musculoskeletal:  Negative for back pain, falls and joint pain.  Skin:  Negative for rash.  Neurological:  Negative for headaches.  Psychiatric/Behavioral:  Negative for depression.   Past Medical History:  Diagnosis Date   Breast cancer ([place])    02/17/10: right lumpectomy reveals 0.8 cm of invasive ductal carcinoma with 1/3 LN+ for isolated tumor cell cluster in Oncotype recurrent score 19 or 12% chance of distant recurrence; Femara self-discontinued secondary to side effects. She continues to decline further therapy   Chronic hyponatremia    Hyponatremia for which she has had since somewhere in the mid 1990's. She's had multiple hospitalization, one hospitalization in 1993, she was diagnosed with an EF of 25% as well, though coronaries were clean during that hospitalization. The cath showed an EF of 50%. She is being worked up for her hyponatremia at [facility].   Dementia ([place])    Dizziness    Fibromuscular dysplasia of renal artery ([place])    1997 Renal angioplasty at [facility]. She subsequently underwent repeat angioplasty a few years later. 12/2009 Abdominal Duplex: <60% bilateral RAS, Sequential narrowing and dilations of renal arteries, suggests fibromuscular dysplasia.    H/O electrolyte imbalance    Hearing loss    Hemorrhoid    History of radiation therapy 2011   Hypertension    Hypothyroidism    Ingrowing nail, left great toe    Non-melanoma skin cancer    right hand   Otalgia of right ear  11/10/2011   Polycythemia    Skin cancer    squamous cell skin cancer removed by Dermatology--arms and hands   Sleep apnea    UTI (urinary tract infection)    Past Surgical History:  Procedure Laterality Date   ANGIOPLASTY OF RIGHT RENAL ARTERY  05/04/1995   APPENDECTOMY     BREAST LUMPECTOMY Right 2012   performed [facility]    core biopsy breast Right 2012   ultrasound guided core needle biopsy of the right breast reveals invasive ductal carcinoma, ER/PR positive, HER2 neu    FRACTURE SURGERY  2010   right   Family History  Problem Relation Age of Onset   Hypertension Mother    Cancer Mother        ? type lived 55    Alcohol abuse Father        died 53    Cirrhosis Father    Hypertension Daughter        [name]   Cirrhosis Brother        a lot of health problems died 68s-70s    Hyperlipidemia Son    Social History   Socioeconomic History   Marital status: Married    Spouse name: Not on file   Number of children: Not on file   Years of education: Not on file   Highest education level: Not on file  Occupational History   Not on file  Tobacco Use   Smoking status: Former  Packs/day: 0.25    Years: 20.00    Pack years: 5.00    Types: Cigarettes   Smokeless tobacco: Former    Quit date: 05/30/1976   Tobacco comments:    early teens; early college smoking Quit: 05/30/1976  Substance and Sexual Activity   Alcohol use: Yes    Alcohol/week: 0.0 standard drinks    Comment: occasional social drinker   Drug use: No   Sexual activity: Yes    Partners: Male  Other Topics Concern   Not on file  Social History Narrative   Married x 59 years as of 09/2018    3 kids 1 daughter and 2 sons 5 grands    Homemaker    DPR   1. Husband John [phone]; [phone]   2. Libby [phone]) [phone]    [place]   Social Determinants of Health   Financial Resource Strain: Not on file  Food Insecurity: Not on file  Transportation Needs: Not on file  Physical  Activity: Not on file  Stress: Not on file  Social Connections: Not on file  Intimate Partner Violence: Not on file   Current Meds  Medication Sig   aspirin 81 MG tablet Take 81 mg by mouth daily.   Cholecalciferol (VITAMIN D3) 50 MCG (2000 UT) TABS Take 4,000 Units by mouth daily.   rivastigmine (EXELON) 9.5 mg/24hr    Tdap (BOOSTRIX) 5-2.5-18.5 LF-MCG/0.5 injection Inject 0.5 mLs into the muscle once for 1 dose.   vitamin B-12 (CYANOCOBALAMIN) 1000 MCG tablet Take by mouth.   [DISCONTINUED] amLODipine-benazepril (LOTREL) 10-40 MG capsule Take 1 capsule by mouth daily.   [DISCONTINUED] atorvastatin (LIPITOR) 10 MG tablet TAKE ONE TABLET BY MOUTH EVERY DAY AT 6PM   [DISCONTINUED] levothyroxine (SYNTHROID) 125 MCG tablet TAKE 1 TABLET BY MOUTH DAILY BEFORE BREAKFAST   Allergies  Allergen Reactions   Aricept [Donepezil Hcl]     GI upset    No results found for this or any previous visit (from the past 2160 hour(s)). Objective  Body mass index is 19.27 kg/m. Wt Readings from Last 3 Encounters:  12/09/20 119 lb 6.4 oz (54.2 kg)  10/28/20 121 lb 9.6 oz (55.2 kg)  06/02/20 128 lb (58.1 kg)   Temp Readings from Last 3 Encounters:  12/09/20 (!) 97.2 F (36.2 C) (Temporal)  10/28/20 98.7 F (37.1 C) (Oral)  06/02/20 (!) 97.4 F (36.3 C) (Oral)   BP Readings from Last 3 Encounters:  12/09/20 114/70  10/28/20 120/78  06/02/20 118/80   Pulse Readings from Last 3 Encounters:  12/09/20 77  10/28/20 74  06/02/20 70    Physical Exam Vitals and nursing note reviewed.  Constitutional:      Appearance: Normal appearance. She is well-developed and well-groomed.  HENT:     Head: Normocephalic and atraumatic.  Eyes:     Conjunctiva/sclera: Conjunctivae normal.     Pupils: Pupils are equal, round, and reactive to light.  Cardiovascular:     Rate and Rhythm: Normal rate and regular rhythm.     Heart sounds: Normal heart sounds. No murmur heard. Pulmonary:     Effort: Pulmonary  effort is normal.     Breath sounds: Normal breath sounds.  Abdominal:     General: Abdomen is flat. Bowel sounds are normal.     Tenderness: There is no abdominal tenderness.  Musculoskeletal:        General: No tenderness.  Skin:    General: Skin is warm and dry.  Neurological:     General: No focal deficit present.     Mental Status: She is alert and oriented to person, place, and time. Mental status is at baseline.     Cranial Nerves: Cranial nerves 2-12 are intact.     Gait: Gait is intact.  Psychiatric:        Attention and Perception: Attention and perception normal.        Mood and Affect: Mood and affect normal.        Speech: Speech normal.        Behavior: Behavior normal. Behavior is cooperative.        Thought Content: Thought content normal.        Cognition and Memory: Cognition and memory normal.        Judgment: Judgment normal.    Assessment  Plan  Hypertension, controlled - Plan: Comprehensive metabolic panel, CBC with Differential/Platelet On lotrel 10-40 mg qd   Hypothyroidism, unspecified type - Plan: levothyroxine (SYNTHROID) 125 MCG tablet, TSH  Hyperlipidemia, unspecified hyperlipidemia type - Plan: atorvastatin (LIPITOR) 10 MG tablet, Lipid panel   Vitamin D deficiency - Plan: Vitamin D (25 hydroxy)  Weight loss down 9 lbs Consider ct chest/ab/pelvis   HM utd flu total care, prevnar, pna 23 utd  shingrix had 2/2  covid vx 3/3 consider booster  Consider Tdap in future Rx pharmacy    Out of age window pap, mammogram (last 03/06/15 negative diag b/l pt does have h/o breast cancer), colonoscopy 01/03/2019 mammo normal declines further as pf 12/09/20  DEXA 11/06/17 osteopenia lumbar not reported 2/2 scoliosis or sclerosis  -rec calcium 600 mg qd and D3 4000 IU daily   Dentist declines for now  Dermatology seen in 2021 spring for tx nmsc right hand and Aks as of 06/02/20 declines further derm Aks to forearms declines as of 11/2020   Provider:  Dr. Olivia Mackie McLean-[facility]oc[facility]-Internal Medicine

## 2020-12-09 NOTE — Patient Instructions (Addendum)
Consider 5th pfizer vaccine 02/17/21 or 03/20/21 Consider Tdap vaccine  Cetaphil/cerave cream  Lost 9 lbs consider premier protein shake the weight from 128 05/2020 to 119 today Consider CT scan chest/abdomen/pelvis  Let me know

## 2020-12-09 NOTE — Telephone Encounter (Signed)
CRITICAL VALUE STICKER  CRITICAL VALUE: Vitamin D-111.59 (H)  RECEIVER (on-site recipient of call): Esperanza Richters  DATE & TIME NOTIFIED: 11/16 @4 :03pm  MESSENGER (representative from lab): Jari Favre, CMA/XT  MD NOTIFIED: Dr. Gayland Curry  TIME OF NOTIFICATION: 4:05pm  RESPONSE: see note

## 2020-12-10 LAB — URINE CULTURE
MICRO NUMBER:: 12645566
SPECIMEN QUALITY:: ADEQUATE

## 2020-12-10 LAB — URINALYSIS, ROUTINE W REFLEX MICROSCOPIC
Bilirubin Urine: NEGATIVE
Glucose, UA: NEGATIVE
Hgb urine dipstick: NEGATIVE
Nitrite: NEGATIVE
Specific Gravity, Urine: 1.026 (ref 1.001–1.035)
Yeast: NONE SEEN /HPF
pH: 5.5 (ref 5.0–8.0)

## 2020-12-10 LAB — MICROSCOPIC MESSAGE

## 2020-12-10 NOTE — Telephone Encounter (Signed)
I called and spoke with patient as well as husband. I did not at first realize that patient has dementia. She hung up on me twice, but both of patient as well as husband knows to stop ALL vitamin d. I have called daughter to review all labs.

## 2020-12-14 ENCOUNTER — Telehealth: Payer: Self-pay

## 2020-12-14 NOTE — Progress Notes (Signed)
Labs indicate she is dehydrated slightly  Make sure drinking 55-64 ounces daily  Avoid nsaids  Stop vitamin D for now its elevated!  Cholesterol improved Thyroid lab normal  Urine pending will call if abnormal With the weight loss of almost 10 pounds we discussed a CT chest abdomen and pelvis today to make sure nothing internal is the cause but pt declined advised she discuss with family  Urine culture no UTI

## 2020-12-14 NOTE — Telephone Encounter (Signed)
LMTCB in regards to lab results.  

## 2020-12-15 NOTE — Telephone Encounter (Signed)
Pt daughter called I stating she is returning a call. Pt is requesting callback.

## 2020-12-15 NOTE — Telephone Encounter (Signed)
Patients daughter informed and verbalized understanding

## 2021-02-10 NOTE — Telephone Encounter (Signed)
Opened in error

## 2021-11-09 ENCOUNTER — Telehealth: Payer: Self-pay | Admitting: Internal Medicine

## 2021-11-09 NOTE — Telephone Encounter (Signed)
error 

## 2021-11-11 ENCOUNTER — Encounter: Payer: Self-pay | Admitting: Internal Medicine

## 2021-11-11 ENCOUNTER — Ambulatory Visit (INDEPENDENT_AMBULATORY_CARE_PROVIDER_SITE_OTHER): Payer: Medicare Other | Admitting: Internal Medicine

## 2021-11-11 VITALS — BP 110/60 | HR 70 | Temp 97.7°F | Ht 66.0 in | Wt 105.6 lb

## 2021-11-11 DIAGNOSIS — I150 Renovascular hypertension: Secondary | ICD-10-CM

## 2021-11-11 DIAGNOSIS — Z853 Personal history of malignant neoplasm of breast: Secondary | ICD-10-CM

## 2021-11-11 DIAGNOSIS — E039 Hypothyroidism, unspecified: Secondary | ICD-10-CM | POA: Diagnosis not present

## 2021-11-11 DIAGNOSIS — R634 Abnormal weight loss: Secondary | ICD-10-CM | POA: Diagnosis not present

## 2021-11-11 DIAGNOSIS — N63 Unspecified lump in unspecified breast: Secondary | ICD-10-CM | POA: Diagnosis not present

## 2021-11-11 NOTE — Progress Notes (Signed)
Patient ID: Laura Carpenter, female   DOB: [birth year], 86 y.o.   MRN: [id number]   Subjective:    Patient ID: Laura Carpenter, female    DOB: 12/28/1935, 86 y.o.   MRN: [id number]   Patient here for  Chief Complaint  Patient presents with   Mass    On right breast   .   HPI Pt of Dr [place].  Work in appt - lump in breast.  Has a history of breast cancer s/p right lumpectomy 01/2010.  Last mammogram 12/2018.  She is accompanied by her daughter. Reported that noticed a lump in her right breast - two weeks ago.  Persistent.  No nipple discharge.  No redness.  Overdue mammogram.  Agreeable to schedule.  Has lost weight.  No nausea or vomiting.  Breathing stable.  No bowel change reported.     Past Medical History:  Diagnosis Date   Breast cancer ([place])    02/17/10: right lumpectomy reveals 0.8 cm of invasive ductal carcinoma with 1/3 LN+ for isolated tumor cell cluster in Oncotype recurrent score 19 or 12% chance of distant recurrence; Femara self-discontinued secondary to side effects. She continues to decline further therapy   Chronic hyponatremia    Hyponatremia for which she has had since somewhere in the mid 1990's. She's had multiple hospitalization, one hospitalization in 1993, she was diagnosed with an EF of 25% as well, though coronaries were clean during that hospitalization. The cath showed an EF of 50%. She is being worked up for her hyponatremia at [facility].   Dementia ([place])    Dizziness    Fibromuscular dysplasia of renal artery ([place])    1997 Renal angioplasty at [facility]. She subsequently underwent repeat angioplasty a few years later. 12/2009 Abdominal Duplex: <60% bilateral RAS, Sequential narrowing and dilations of renal arteries, suggests fibromuscular dysplasia.    H/O electrolyte imbalance    Hearing loss    Hemorrhoid    History of radiation therapy 2011   Hypertension    Hypothyroidism    Ingrowing nail, left great toe    Non-melanoma skin cancer    right hand   Otalgia of right ear  11/10/2011   Polycythemia    Skin cancer    squamous cell skin cancer removed by Dermatology--arms and hands   Sleep apnea    UTI (urinary tract infection)    Past Surgical History:  Procedure Laterality Date   ANGIOPLASTY OF RIGHT RENAL ARTERY  05/04/1995   APPENDECTOMY     BREAST LUMPECTOMY Right 2012   performed [facility]    core biopsy breast Right 2012   ultrasound guided core needle biopsy of the right breast reveals invasive ductal carcinoma, ER/PR positive, HER2 neu    FRACTURE SURGERY  2010   right   Family History  Problem Relation Age of Onset   Hypertension Mother    Cancer Mother        ? type lived 8    Alcohol abuse Father        died 61    Cirrhosis Father    Hypertension Daughter        [name]   Cirrhosis Brother        a lot of health problems died 60s-70s    Hyperlipidemia Son    Social History   Socioeconomic History   Marital status: Married    Spouse name: Not on file   Number of children: Not on file   Years of education: Not on  file   Highest education level: Not on file  Occupational History   Not on file  Tobacco Use   Smoking status: Former    Packs/day: 0.25    Years: 20.00    Total pack years: 5.00    Types: Cigarettes   Smokeless tobacco: Former    Quit date: 05/30/1976   Tobacco comments:    early teens; early college smoking Quit: 05/30/1976  Substance and Sexual Activity   Alcohol use: Yes    Alcohol/week: 0.0 standard drinks of alcohol    Comment: occasional social drinker   Drug use: No   Sexual activity: Yes    Partners: Male  Other Topics Concern   Not on file  Social History Narrative   Married x 59 years as of 09/2018    3 kids 1 daughter and 2 sons 5 grands    Homemaker    DPR   1. Husband John [phone]; [phone]   2. Libby [phone]) [phone]    [place]   Social Determinants of Health   Financial Resource Strain: Not on file  Food Insecurity: Not on file  Transportation Needs: Not on file   Physical Activity: Not on file  Stress: Not on file  Social Connections: Not on file     Review of Systems  Constitutional:  Negative for fever.       Continued weight loss.   HENT:  Negative for congestion and sinus pressure.   Respiratory:  Negative for cough, chest tightness and shortness of breath.   Cardiovascular:  Negative for chest pain, palpitations and leg swelling.  Gastrointestinal:  Negative for abdominal pain, diarrhea, nausea and vomiting.  Genitourinary:  Negative for difficulty urinating and dysuria.  Musculoskeletal:  Negative for joint swelling and myalgias.  Skin:  Negative for color change and rash.  Neurological:  Negative for dizziness and headaches.  Psychiatric/Behavioral:  Negative for agitation and dysphoric mood.        Objective:     BP 110/60 (BP Location: Left Arm, Patient Position: Sitting, Cuff Size: Normal)   Pulse 70   Temp 97.7 F (36.5 C) (Oral)   Ht _0  (1.676 m)   Wt 105 lb 9.6 oz (47.9 kg)   SpO2 95%   BMI 17.04 kg/m  Wt Readings from Last 3 Encounters:  11/11/21 105 lb 9.6 oz (47.9 kg)  12/09/20 119 lb 6.4 oz (54.2 kg)  10/28/20 121 lb 9.6 oz (55.2 kg)    Physical Exam Vitals reviewed.  Constitutional:      General: She is not in acute distress.    Appearance: Normal appearance.  HENT:     Head: Normocephalic and atraumatic.     Right Ear: External ear normal.     Left Ear: External ear normal.  Eyes:     General: No scleral icterus.       Right eye: No discharge.        Left eye: No discharge.     Conjunctiva/sclera: Conjunctivae normal.  Neck:     Thyroid: No thyromegaly.  Cardiovascular:     Rate and Rhythm: Normal rate and regular rhythm.  Pulmonary:     Effort: No respiratory distress.     Breath sounds: Normal breath sounds. No wheezing.     Comments: Breasts - no nipple discharge.  Palpable right breast nodule 12:00.  Could not appreciate any other nodules or axillary adenopathy.  Abdominal:     General:  Bowel sounds are normal.  Palpations: Abdomen is soft.     Tenderness: There is no abdominal tenderness.  Musculoskeletal:        General: No swelling or tenderness.     Cervical back: Neck supple. No tenderness.  Lymphadenopathy:     Cervical: No cervical adenopathy.  Skin:    Findings: No erythema or rash.  Neurological:     Mental Status: She is alert.  Psychiatric:        Mood and Affect: Mood normal.        Behavior: Behavior normal.      Outpatient Encounter Medications as of 11/11/2021  Medication Sig   amLODipine-benazepril (LOTREL) 10-40 MG capsule Take 1 capsule by mouth daily.   aspirin 81 MG tablet Take 81 mg by mouth daily.   atorvastatin (LIPITOR) 10 MG tablet TAKE ONE TABLET BY MOUTH EVERY DAY AT 6PM   Cholecalciferol (VITAMIN D3) 50 MCG (2000 UT) TABS Take 4,000 Units by mouth daily.   levothyroxine (SYNTHROID) 125 MCG tablet Take 1 tablet (125 mcg total) by mouth daily before breakfast.   rivastigmine (EXELON) 9.5 mg/24hr    vitamin B-12 (CYANOCOBALAMIN) 1000 MCG tablet Take by mouth.   No facility-administered encounter medications on file as of 11/11/2021.     Lab Results  Component Value Date   WBC 5.9 11/11/2021   HGB 14.0 11/11/2021   HCT 41.1 11/11/2021   PLT 220.0 11/11/2021   GLUCOSE 92 11/11/2021   CHOL 198 12/09/2020   TRIG 124.0 12/09/2020   HDL 100.60 12/09/2020   LDLCALC 73 12/09/2020   ALT 37 (H) 11/11/2021   AST 33 11/11/2021   NA 136 11/11/2021   K 3.9 11/11/2021   CL 98 11/11/2021   CREATININE 1.02 11/11/2021   BUN 20 11/11/2021   CO2 27 11/11/2021   TSH 1.09 11/11/2021   HGBA1C 5.4 08/06/2018    US Abdomen Complete  Result Date: 08/21/2019 CLINICAL DATA:  Epigastric abdominal pain, history breast cancer, hypertension, dementia, former smoker EXAM: ABDOMEN ULTRASOUND COMPLETE COMPARISON:  None Correlation: CT abdomen and pelvis 10/24/2014 FINDINGS: Gallbladder: Normally distended without stones or wall thickening. No  pericholecystic fluid or sonographic Murphy sign. Common bile duct: Diameter: 2 mm, normal Liver: Normal echogenicity without mass or nodularity. No intrahepatic biliary dilatation. Portal vein is patent on color Doppler imaging with normal direction of blood flow towards the liver. IVC: Normal appearance Pancreas: Normal appearance Spleen: Normal appearance, 6.7 cm length Right Kidney: Length: 8.9 cm. Normal morphology without mass or hydronephrosis. Left Kidney: Length: 8.9 cm. Normal morphology without mass or hydronephrosis. Abdominal aorta: Mild atherosclerotic changes.  Normal caliber. Other findings: No RIGHT upper quadrant free fluid. IMPRESSION: Normal exam. Electronically Signed   By: Lavonia Dana M.D.   On: 08/21/2019 13:36       Assessment & Plan:   Problem List Items Addressed This Visit     Breast nodule    History of breast cancer.  Palpable nodule as outlined.  Schedule for diagnostic mammogram with possible ultrasound.  Overdue bilateral.      Relevant Orders   MM DIAG BREAST TOMO BILATERAL   US BREAST LTD UNI RIGHT INC AXILLA   History of breast cancer - Primary    History of breast cancer.  Palpable nodule as outlined.  Schedule for diagnostic mammogram with possible ultrasound.  Overdue bilateral.       Relevant Orders   MM DIAG BREAST TOMO BILATERAL   US BREAST LTD UNI RIGHT INC AXILLA   Hypertension  On lotrel.  Blood pressure as outlined.        Hypothyroidism    On synthroid.  Check tsh today.       Weight loss    Continued weight loss.  Discussed today.  Will check cbc, met c and tsh.  Encourage increased po intake - nutritional shakes, etc.  Per review of Dr Kelly Services note, declined scanning.       Relevant Orders   CBC with Differential/Platelet (Completed)   Comprehensive metabolic panel (Completed)   TSH (Completed)     Einar Pheasant, MD

## 2021-11-12 LAB — CBC WITH DIFFERENTIAL/PLATELET
Basophils Absolute: 0 10*3/uL (ref 0.0–0.1)
Basophils Relative: 0.3 % (ref 0.0–3.0)
Eosinophils Absolute: 0 10*3/uL (ref 0.0–0.7)
Eosinophils Relative: 0.7 % (ref 0.0–5.0)
HCT: 41.1 % (ref 36.0–46.0)
Hemoglobin: 14 g/dL (ref 12.0–15.0)
Lymphocytes Relative: 24.6 % (ref 12.0–46.0)
Lymphs Abs: 1.4 10*3/uL (ref 0.7–4.0)
MCHC: 34 g/dL (ref 30.0–36.0)
MCV: 96.1 fl (ref 78.0–100.0)
Monocytes Absolute: 0.6 10*3/uL (ref 0.1–1.0)
Monocytes Relative: 9.9 % (ref 3.0–12.0)
Neutro Abs: 3.8 10*3/uL (ref 1.4–7.7)
Neutrophils Relative %: 64.5 % (ref 43.0–77.0)
Platelets: 220 10*3/uL (ref 150.0–400.0)
RBC: 4.28 Mil/uL (ref 3.87–5.11)
RDW: 13.9 % (ref 11.5–15.5)
WBC: 5.9 10*3/uL (ref 4.0–10.5)

## 2021-11-12 LAB — TSH: TSH: 1.09 u[IU]/mL (ref 0.35–5.50)

## 2021-11-12 LAB — COMPREHENSIVE METABOLIC PANEL
ALT: 37 U/L — ABNORMAL HIGH (ref 0–35)
AST: 33 U/L (ref 0–37)
Albumin: 4.4 g/dL (ref 3.5–5.2)
Alkaline Phosphatase: 70 U/L (ref 39–117)
BUN: 20 mg/dL (ref 6–23)
CO2: 27 mEq/L (ref 19–32)
Calcium: 10.3 mg/dL (ref 8.4–10.5)
Chloride: 98 mEq/L (ref 96–112)
Creatinine, Ser: 1.02 mg/dL (ref 0.40–1.20)
GFR: 50.56 mL/min — ABNORMAL LOW (ref >60.00)
Glucose, Bld: 92 mg/dL (ref 70–99)
Potassium: 3.9 mEq/L (ref 3.5–5.1)
Sodium: 136 mEq/L (ref 135–145)
Total Bilirubin: 0.7 mg/dL (ref 0.2–1.2)
Total Protein: 6.5 g/dL (ref 6.0–8.3)

## 2021-11-15 ENCOUNTER — Encounter: Payer: Self-pay | Admitting: Internal Medicine

## 2021-11-15 NOTE — Assessment & Plan Note (Signed)
On synthroid.  Check tsh today.

## 2021-11-15 NOTE — Assessment & Plan Note (Signed)
History of breast cancer.  Palpable nodule as outlined.  Schedule for diagnostic mammogram with possible ultrasound.  Overdue bilateral.

## 2021-11-15 NOTE — Assessment & Plan Note (Signed)
Continued weight loss.  Discussed today.  Will check cbc, met c and tsh.  Encourage increased po intake - nutritional shakes, etc.  Per review of Dr Kelly Services note, declined scanning.

## 2021-11-15 NOTE — Assessment & Plan Note (Signed)
On lotrel.  Blood pressure as outlined.

## 2021-11-16 ENCOUNTER — Telehealth: Payer: Self-pay

## 2021-11-16 ENCOUNTER — Other Ambulatory Visit: Payer: Self-pay | Admitting: Internal Medicine

## 2021-11-16 DIAGNOSIS — R7989 Other specified abnormal findings of blood chemistry: Secondary | ICD-10-CM

## 2021-11-16 NOTE — Progress Notes (Signed)
Order placed for f/u liver panel.  

## 2021-11-16 NOTE — Telephone Encounter (Signed)
Left a detailed msg telling the spouse to CB to get pt scheduled for a non fasting lab in 2 weeks.:    Einar Pheasant, MD  Zacarias Pontes, CMA See previous message.  I have placed order for liver panel.  Needs non fasting lab appt scheduled in two weeks.

## 2021-11-25 ENCOUNTER — Ambulatory Visit
Admission: RE | Admit: 2021-11-25 | Discharge: 2021-11-25 | Disposition: A | Discharge status: Home / Self Care | Payer: Medicare Other | Source: Ambulatory Visit | Attending: Internal Medicine | Admitting: Internal Medicine

## 2021-11-25 DIAGNOSIS — N63 Unspecified lump in unspecified breast: Secondary | ICD-10-CM

## 2021-11-25 DIAGNOSIS — Z853 Personal history of malignant neoplasm of breast: Secondary | ICD-10-CM | POA: Insufficient documentation

## 2021-12-01 ENCOUNTER — Other Ambulatory Visit (INDEPENDENT_AMBULATORY_CARE_PROVIDER_SITE_OTHER): Payer: Medicare Other

## 2021-12-01 DIAGNOSIS — R7989 Other specified abnormal findings of blood chemistry: Secondary | ICD-10-CM

## 2021-12-01 LAB — HEPATIC FUNCTION PANEL
ALT: 43 U/L — ABNORMAL HIGH (ref 0–35)
AST: 38 U/L — ABNORMAL HIGH (ref 0–37)
Albumin: 4.5 g/dL (ref 3.5–5.2)
Alkaline Phosphatase: 74 U/L (ref 39–117)
Bilirubin, Direct: 0.2 mg/dL (ref 0.0–0.3)
Total Bilirubin: 1 mg/dL (ref 0.2–1.2)
Total Protein: 6.7 g/dL (ref 6.0–8.3)

## 2021-12-04 ENCOUNTER — Other Ambulatory Visit: Payer: Self-pay | Admitting: Internal Medicine

## 2021-12-04 DIAGNOSIS — R634 Abnormal weight loss: Secondary | ICD-10-CM

## 2021-12-04 DIAGNOSIS — R7989 Other specified abnormal findings of blood chemistry: Secondary | ICD-10-CM

## 2021-12-04 NOTE — Progress Notes (Signed)
Order placed for abdominal ultrasound.   

## 2021-12-10 ENCOUNTER — Ambulatory Visit
Admission: RE | Admit: 2021-12-10 | Discharge: 2021-12-10 | Disposition: A | Discharge status: Home / Self Care | Payer: Medicare Other | Source: Ambulatory Visit | Attending: Internal Medicine | Admitting: Internal Medicine

## 2021-12-10 DIAGNOSIS — R7989 Other specified abnormal findings of blood chemistry: Secondary | ICD-10-CM | POA: Insufficient documentation

## 2021-12-10 DIAGNOSIS — R634 Abnormal weight loss: Secondary | ICD-10-CM | POA: Diagnosis present

## 2021-12-13 ENCOUNTER — Other Ambulatory Visit: Payer: Self-pay

## 2021-12-13 DIAGNOSIS — R7989 Other specified abnormal findings of blood chemistry: Secondary | ICD-10-CM

## 2021-12-14 ENCOUNTER — Telehealth: Payer: Self-pay | Admitting: Internal Medicine

## 2021-12-14 ENCOUNTER — Encounter: Payer: Self-pay | Admitting: Internal Medicine

## 2021-12-14 NOTE — Telephone Encounter (Signed)
Pt daughter called stating her father received a message from Dr. Nicki Reaper but they could not understand message. Pt daughter would like to be called

## 2021-12-15 ENCOUNTER — Other Ambulatory Visit: Payer: Self-pay

## 2021-12-15 DIAGNOSIS — R7989 Other specified abnormal findings of blood chemistry: Secondary | ICD-10-CM

## 2021-12-15 NOTE — Telephone Encounter (Signed)
Patient's daughter, Aura Dials, returned Denita Lung, [place] call.  Luna Fuse not available at time of call.

## 2021-12-15 NOTE — Telephone Encounter (Signed)
Ok

## 2021-12-15 NOTE — Telephone Encounter (Signed)
See phone note

## 2021-12-15 NOTE — Telephone Encounter (Signed)
See result note.  

## 2021-12-15 NOTE — Telephone Encounter (Signed)
**  result note

## 2021-12-26 ENCOUNTER — Encounter: Payer: Self-pay | Admitting: Family Medicine

## 2021-12-27 ENCOUNTER — Other Ambulatory Visit: Payer: Self-pay

## 2021-12-27 DIAGNOSIS — R2689 Other abnormalities of gait and mobility: Secondary | ICD-10-CM

## 2021-12-28 NOTE — Telephone Encounter (Signed)
signed

## 2021-12-30 ENCOUNTER — Telehealth: Payer: Self-pay

## 2021-12-30 NOTE — Telephone Encounter (Signed)
Patient is scheduled with another provider to be evaluated for a walker on Monday. Form was given to M. Arnetted's CMA.  I called and spoke with the patients daughter.  Halynn Reitano,cma

## 2022-01-03 ENCOUNTER — Encounter: Payer: Self-pay | Admitting: Family

## 2022-01-03 ENCOUNTER — Ambulatory Visit (INDEPENDENT_AMBULATORY_CARE_PROVIDER_SITE_OTHER): Payer: Medicare Other | Admitting: Family

## 2022-01-03 VITALS — BP 118/70 | HR 78 | Temp 97.8°F | Wt 105.0 lb

## 2022-01-03 DIAGNOSIS — G4730 Sleep apnea, unspecified: Secondary | ICD-10-CM | POA: Diagnosis not present

## 2022-01-03 DIAGNOSIS — R5381 Other malaise: Secondary | ICD-10-CM | POA: Insufficient documentation

## 2022-01-03 NOTE — Assessment & Plan Note (Addendum)
Patient reports compliance with CPAP.  It has been 7 years since her previous testing.  Of note, she is lost significant weight since then.  Advise she would need to be retested and fitted for Cipap.  Referral to pulmonology

## 2022-01-03 NOTE — Progress Notes (Signed)
Subjective:    Patient ID: Laura Carpenter, female    DOB: 1935-12-16, 86 y.o.   MRN: [id number]  CC: Laura Carpenter is a 86 y.o. female who presents today for an acute visit.    HPI: Accompanied by husband Here today primarily to discuss need for rollator .  She is using a cane right now.  Patient and husband describe balance problems and gait.  She showers and dresses on her own.  She reports she has shuffled at home.  She holds onto furniture to balance herself.  She is worried about falling.   Lives in downstairs of home. Steps in the garage.  Legs are feel weak. Arms feel stronger.  No numbness in her legs. She has private nurse who comes once per week who is walking with her.   No pressure sores.  History of hypertension, Alzheimer's, OSA  Compliant with Cipap.    Following with Laura Carpenter,, PA at [facility] whom started mirtazapine 12/27/21 for appetite and mood.   Previously been referred to physical therapy [facility] Dr. Volanda Napoleon 02/17/2022 HISTORY:  Past Medical History:  Diagnosis Date   Breast cancer ([place])    02/17/10: right lumpectomy reveals 0.8 cm of invasive ductal carcinoma with 1/3 LN+ for isolated tumor cell cluster in Oncotype recurrent score 19 or 12% chance of distant recurrence; Femara self-discontinued secondary to side effects. She continues to decline further therapy   Chronic hyponatremia    Hyponatremia for which she has had since somewhere in the mid 1990's. She's had multiple hospitalization, one hospitalization in 1993, she was diagnosed with an EF of 25% as well, though coronaries were clean during that hospitalization. The cath showed an EF of 50%. She is being worked up for her hyponatremia at [facility].   Dementia ([place])    Dizziness    Fibromuscular dysplasia of renal artery ([place])    1997 Renal angioplasty at [facility] She subsequently underwent repeat angioplasty a few years later. 12/2009 Abdominal Duplex: <60% bilateral RAS, Sequential narrowing and  dilations of renal arteries, suggests fibromuscular dysplasia.    H/O electrolyte imbalance    Hearing loss    Hemorrhoid    History of radiation therapy 2011   Hypertension    Hypothyroidism    Ingrowing nail, left great toe    Non-melanoma skin cancer    right hand   Otalgia of right ear 11/10/2011   Polycythemia    Skin cancer    squamous cell skin cancer removed by Dermatology--arms and hands   Sleep apnea    UTI (urinary tract infection)    Past Surgical History:  Procedure Laterality Date   ANGIOPLASTY OF RIGHT RENAL ARTERY  05/04/1995   APPENDECTOMY     BREAST LUMPECTOMY Right 2012   performed [facility]    core biopsy breast Right 2012   ultrasound guided core needle biopsy of the right breast reveals invasive ductal carcinoma, ER/PR positive, HER2 neu    FRACTURE SURGERY  2010   right   Family History  Problem Relation Age of Onset   Hypertension Mother    Cancer Mother        ? type lived 10    Alcohol abuse Father        died 65    Cirrhosis Father    Hypertension Daughter        [name]   Cirrhosis Brother        a lot of health problems died 62s-70s  Hyperlipidemia Son     Allergies: Aricept [donepezil hcl] Current Outpatient Medications on File Prior to Visit  Medication Sig Dispense Refill   amLODipine-benazepril (LOTREL) 10-40 MG capsule Take 1 capsule by mouth daily. 90 capsule 3   aspirin 81 MG tablet Take 81 mg by mouth daily.     atorvastatin (LIPITOR) 10 MG tablet TAKE ONE TABLET BY MOUTH EVERY DAY AT 6PM 90 tablet 3   Cholecalciferol (VITAMIN D3) 50 MCG (2000 UT) TABS Take 4,000 Units by mouth daily.     levothyroxine (SYNTHROID) 125 MCG tablet Take 1 tablet (125 mcg total) by mouth daily before breakfast. 90 tablet 3   mirtazapine (REMERON) 7.5 MG tablet Take 1/2 tablet 90 minutes before bed     rivastigmine (EXELON) 9.5 mg/24hr      vitamin B-12 (CYANOCOBALAMIN) 1000 MCG tablet Take by mouth.     No current facility-administered  medications on file prior to visit.    Social History   Tobacco Use   Smoking status: Former    Packs/day: 0.25    Years: 20.00    Total pack years: 5.00    Types: Cigarettes   Smokeless tobacco: Former    Quit date: 05/30/1976   Tobacco comments:    early teens; early college smoking Quit: 05/30/1976  Substance Use Topics   Alcohol use: Yes    Alcohol/week: 0.0 standard drinks of alcohol    Comment: occasional social drinker   Drug use: No    Review of Systems  Constitutional:  Negative for chills and fever.  Respiratory:  Negative for cough.   Cardiovascular:  Negative for chest pain and palpitations.  Gastrointestinal:  Negative for nausea and vomiting.  Neurological:  Positive for weakness (lower extremity).      Objective:    BP 118/70   Pulse 78   Temp 97.8 F (36.6 C) (Oral)   Wt 105 lb (47.6 kg)   SpO2 95%   BMI 16.95 kg/m   Wt Readings from Last 3 Encounters:  01/03/22 105 lb (47.6 kg)  11/11/21 105 lb 9.6 oz (47.9 kg)  12/09/20 119 lb 6.4 oz (54.2 kg)     Physical Exam Vitals reviewed.  Constitutional:      Appearance: She is well-developed.  Eyes:     Conjunctiva/sclera: Conjunctivae normal.  Cardiovascular:     Rate and Rhythm: Normal rate and regular rhythm.     Pulses: Normal pulses.     Heart sounds: Normal heart sounds.  Pulmonary:     Effort: Pulmonary effort is normal.     Breath sounds: Normal breath sounds. No wheezing, rhonchi or rales.  Skin:    General: Skin is warm and dry.  Neurological:     Mental Status: She is alert.     Comments: LE 4/5 strength She solely uses her arms to get out of the chair.  Her gait is wide-based and slightly unsteady in the exam room.  She leans on the exam table when she is walking.  Alert and oriented x 3.   Psychiatric:        Speech: Speech normal.        Behavior: Behavior normal.        Thought Content: Thought content normal.        Assessment & Plan:   Problem List Items Addressed  This Visit       Respiratory   Sleep apnea - Primary    Patient reports compliance with CPAP.  It has been 7  years since her previous testing.  Of note, she is lost significant weight since then.  Advise she would need to be retested and fitted for Cipap.  Referral to pulmonology      Relevant Orders   Ambulatory referral to Pulmonology     Other   Physical deconditioning    Long discussion as it relates to fall prevention and safety.  Patient has evidence of lower extremity weakness and deconditioning overall.  This is associated with dementia, recent weight loss.  She has follow-up with neurology and recently started Remeron.    she has been using a cane up until this point.  We had a discussion in regards to her gait instability in which a rollator would likely offer more stability and safety.  Patient and husband are in agreement.  Recommended physical therapy however has a private nurse coming once a week in which they plan on doing more more exercises with.  They will let me know they would like a formal referral to physical therapy.         I am having Laura Carpenter maintain her aspirin, cyanocobalamin, rivastigmine, Vitamin D3, levothyroxine, atorvastatin, amLODipine-benazepril, and mirtazapine.   No orders of the defined types were placed in this encounter.   Return precautions given.   Risks, benefits, and alternatives of the medications and treatment plan prescribed today were discussed, and patient expressed understanding.   Education regarding symptom management and diagnosis given to patient on AVS.  Continue to follow with McLean-Scocuzza, Nino Glow, MD for routine health maintenance.   Laura Carpenter and I agreed with plan.   Mable Paris, FNP

## 2022-01-03 NOTE — Assessment & Plan Note (Addendum)
Long discussion as it relates to fall prevention and safety.  Patient has evidence of lower extremity weakness and deconditioning overall.  This is associated with dementia, recent weight loss.  She has follow-up with neurology and recently started Remeron.    she has been using a cane up until this point.  We had a discussion in regards to her gait instability in which a rollator would likely offer more stability and safety.  Patient and husband are in agreement.  Recommended physical therapy however has a private nurse coming once a week in which they plan on doing more more exercises with.  They will let me know they would like a formal referral to physical therapy.

## 2022-01-03 NOTE — Patient Instructions (Addendum)
Please call Ruston at [phone] in a couple of days discussed with him picking up a rollator.  let me know if any issues in doing so.   I placed a referral to pulmonology.  Let us know if you dont hear back within a week in regards to an appointment being scheduled.   So that you are aware, if you are [facility] MyChart user , please pay attention to your MyChart messages as you may receive a MyChart message with a phone number to call and schedule this test/appointment own your own from our referral coordinator. This is a new process so I do not want you to miss this message.  If you are not a MyChart user, you will receive a phone call.

## 2022-01-05 ENCOUNTER — Other Ambulatory Visit: Payer: Self-pay | Admitting: Family Medicine

## 2022-01-05 DIAGNOSIS — E785 Hyperlipidemia, unspecified: Secondary | ICD-10-CM

## 2022-01-05 DIAGNOSIS — I10 Essential (primary) hypertension: Secondary | ICD-10-CM

## 2022-01-05 MED ORDER — ATORVASTATIN CALCIUM 10 MG PO TABS: ORAL_TABLET | ORAL | 3 refills | Status: DC

## 2022-01-05 MED ORDER — AMLODIPINE BESY-BENAZEPRIL HCL 10-40 MG PO CAPS: 1.0000 | ORAL_CAPSULE | Freq: Every day | ORAL | 3 refills | Status: DC

## 2022-01-05 NOTE — Telephone Encounter (Signed)
Refill request fax came for pts amlodipine and atorvastatin.  Pts LOV: 01-03-22  TOC appt with walsh: 02-17-22  [facility]

## 2022-02-10 ENCOUNTER — Encounter: Payer: Self-pay | Admitting: Internal Medicine

## 2022-02-17 ENCOUNTER — Ambulatory Visit (INDEPENDENT_AMBULATORY_CARE_PROVIDER_SITE_OTHER): Payer: Medicare Other | Admitting: Family Medicine

## 2022-02-17 ENCOUNTER — Encounter: Payer: Self-pay | Admitting: Family Medicine

## 2022-02-17 VITALS — BP 100/60 | HR 75 | Temp 97.9°F | Ht 67.0 in | Wt 105.2 lb

## 2022-02-17 DIAGNOSIS — E785 Hyperlipidemia, unspecified: Secondary | ICD-10-CM

## 2022-02-17 DIAGNOSIS — I150 Renovascular hypertension: Secondary | ICD-10-CM

## 2022-02-17 DIAGNOSIS — I773 Arterial fibromuscular dysplasia: Secondary | ICD-10-CM | POA: Diagnosis not present

## 2022-02-17 DIAGNOSIS — F0280 Dementia in other diseases classified elsewhere without behavioral disturbance: Secondary | ICD-10-CM

## 2022-02-17 DIAGNOSIS — R7989 Other specified abnormal findings of blood chemistry: Secondary | ICD-10-CM

## 2022-02-17 DIAGNOSIS — C4490 Unspecified malignant neoplasm of skin, unspecified: Secondary | ICD-10-CM

## 2022-02-17 DIAGNOSIS — G308 Other Alzheimer's disease: Secondary | ICD-10-CM | POA: Diagnosis not present

## 2022-02-17 DIAGNOSIS — E039 Hypothyroidism, unspecified: Secondary | ICD-10-CM

## 2022-02-17 DIAGNOSIS — E538 Deficiency of other specified B group vitamins: Secondary | ICD-10-CM

## 2022-02-17 DIAGNOSIS — R634 Abnormal weight loss: Secondary | ICD-10-CM

## 2022-02-17 MED ORDER — LEVOTHYROXINE SODIUM 125 MCG PO TABS: 125.0000 ug | ORAL_TABLET | Freq: Every day | ORAL | 3 refills | Status: DC

## 2022-02-17 NOTE — Patient Instructions (Addendum)
It was a pleasure meeting you today. Thank you for allowing me to take part in your health care.  Our goals for today as we discussed include:  Your blood pressure is low. Your lying, sitting and standing blood pressures are also low. Hold your blood pressure medication for the next few days. Monitor your blood pressure at home.  If blood pressure greater than 130/90 resume Lotrel daily.  If blood pressure remains less than 100/60 continue to hold blood pressure medication. Follow-up in nurse clinic on Monday for blood pressure check.  Refill sent for your levothyroxine.  Please schedule appointment for April 2024.  We will check your thyroid and kidney function at that time.  Recommend boost 3 times daily.  There was a referral previously sent to pulmonology for sleep apnea  The GI referral is for elevated liver enzymes.  Recommend Tetanus Vaccination.  This is given every 10 years.   If you have any questions or concerns, please do not hesitate to call the office at [phone].  I look forward to our next visit and until then take care and stay safe.  Regards,   Carollee Leitz, MD   [facility]

## 2022-02-17 NOTE — Progress Notes (Signed)
SUBJECTIVE:   Chief Complaint  Patient presents with   Transitions Of Care   HPI Patient presents to clinic with daughter to transfer care.  History obtained mostly from daughter given history of cognitive decline.  No acute concerns today.  Hypertension Endorses mild off balance from sitting to standing position lasting couple seconds and self resolves.  Does not check blood pressures at home.  Has not had any falls, loss of consciousness, dizziness or weakness.  Currently takes Lotrel 10-40 mg daily.  Recently started mirtazapine 7.5 mg to help increase with appetite and mood disorder.  Dementia No acute decrease in memory loss.  Currently on rivastigmine patch 9.5 mg every 24 hours.  Recently started mirtazapine for decreased appetite and agitation.  Per daughter has been doing well but feels that mirtazapine needs to be increased.  Appetite has not picked up.  Hyperlipidemia Takes Lipitor 10 mg daily and tolerating well.  No myalgias.  Daughter reports was previously seen by memory care, Dr. Wynetta Emery who recommended pulmonology consult for sleep apnea.  Upon review of chart pulmonology has recently been consulted for OSA.  Appointment has been scheduled for 02/02  PERTINENT PMH / PSH: Hypertension Hypothyroidism Dementia Hyperlipidemia History of breast cancer  OBJECTIVE:  BP 100/60   Pulse 75   Temp 97.9 F (36.6 C) (Oral)   Ht '5\' 7"'$  (1.702 m)   Wt 105 lb 3.2 oz (47.7 kg)   SpO2 92%   BMI 16.48 kg/m    Physical Exam Vitals reviewed.  Constitutional:      General: She is not in acute distress.    Appearance: She is normal weight. She is not ill-appearing.  HENT:     Head: Normocephalic.  Eyes:     Conjunctiva/sclera: Conjunctivae normal.  Neck:     Thyroid: Thyromegaly present. No thyroid tenderness.  Cardiovascular:     Rate and Rhythm: Normal rate and regular rhythm.     Pulses: Normal pulses.  Pulmonary:     Effort: Pulmonary effort is normal.      Breath sounds: Normal breath sounds.  Abdominal:     General: Bowel sounds are normal.  Neurological:     Mental Status: She is alert. Mental status is at baseline.  Psychiatric:        Mood and Affect: Mood normal.        Behavior: Behavior normal.        Thought Content: Thought content normal.        Judgment: Judgment normal.     ASSESSMENT/PLAN:  Renovascular hypertension Assessment & Plan: Chronic.  Soft blood pressures currently asymptomatic. Endorses unsteadiness from sitting to standing position. Orthostatics positive Hold Lotrel Follow-up in 4 days at nurse clinic for repeat blood pressure check.  Will need to repeat orthostatic vitals as well. Monitor blood pressure at home.  If BP greater than 140/90 resume Lotrel.  If BP remains less than 100/60 continue to hold Lotrel Strict return precautions provided  Orders: -     Comprehensive metabolic panel; Future -     CBC with Differential/Platelet; Future  Hypothyroidism, unspecified type Assessment & Plan: Chronic.  Stable.  Asymptomatic.  Last TSH within normal limits Refill levothyroxine 125 mcg daily Repeat TSH at next visit  Orders: -     Levothyroxine Sodium; Take 1 tablet (125 mcg total) by mouth daily before breakfast.  Dispense: 90 tablet; Refill: 3 -     TSH; Future  Alzheimer's dementia of other onset, without behavioral disturbance, psychotic  disturbance, mood disturbance, or anxiety, unspecified dementia severity ([place]) Assessment & Plan: Chronic.  Stable.  Daughter reports no change in memory for the past 2 years.  Was previously on Aricept but discontinued secondary to GI side effects.  Currently taking rivastigmine patch and recently started mirtazapine to help improve appetite and decrease agitation. Follows with Dr. Wynetta Emery at the memory unit at [place] rivastigmine 9.5 mg daily Continue mirtazapine 7.5 mg nightly.  Patient daughter plans to discuss with Dr. Wynetta Emery increasing this  medication Follow-up with Dr. Wynetta Emery as scheduled    Fibromuscular dysplasia of renal artery [facility]) Assessment & Plan: Chronic.  Stable.  Currently on Lotrel.   Holding Lotrel for soft blood pressures and positive orthostatics   Hyperlipidemia, unspecified hyperlipidemia type Assessment & Plan: Chronic.  Stable.  Recent LDL less than 100.  On statin therapy and tolerating well.  No myalgias. Continue Lipitor 10 mg daily Repeat fasting lipids  Orders: -     Lipid panel; Future  Skin cancer Assessment & Plan: Declines dermatology referral.   B12 deficiency Assessment & Plan: Previously elevated greater than 1500. Currently taking vitamin B12 1000 mcg's daily.   Recheck levels, if remains elevated can discontinue supplementation.  Orders: -     Vitamin B12; Future  Weight loss Assessment & Plan: Recent BMI 16.48.  Essentially unchanged from the past year.  Patient reports she eats enough for her.  Daughter indicates that patient does not eat much.  Was recently started on mirtazapine 7.5 mg by Dr. Wynetta Emery at [facility] to help with increasing appetite.  Daughter reports that she does not find this is been beneficial and is hoping to discuss an increase in medication and at their next visit. Encouraged boost 3 times daily Encouraged finger foods to have easy access    Elevated LFTs Assessment & Plan: Labs from 10/23 showed mild elevation of ALT.  Repeat labs from 11/23 showed rising ALT/AST.  Patient was referred to GI at that time for evaluation.  Currently on statin therapy.  Denies any abdominal symptoms. Scheduled GI appointment for March for evaluation of abnormal LFTs   HCM Recommend tetanus vaccine Recommend RSV vaccine Recommend flu vaccine  PDMP reviewed  Return in about 4 days (around 02/21/2022) for RN clinic, blood pressure check and orthostatic vitals.. Follow-up with PCP in 3 months or sooner if needed. Carollee Leitz, MD

## 2022-02-20 ENCOUNTER — Encounter: Payer: Self-pay | Admitting: Family Medicine

## 2022-02-20 DIAGNOSIS — R7989 Other specified abnormal findings of blood chemistry: Secondary | ICD-10-CM | POA: Insufficient documentation

## 2022-02-20 NOTE — Assessment & Plan Note (Signed)
Chronic.  Stable.  Daughter reports no change in memory for the past 2 years.  Was previously on Aricept but discontinued secondary to GI side effects.  Currently taking rivastigmine patch and recently started mirtazapine to help improve appetite and decrease agitation. Follows with Dr. Wynetta Emery at the memory unit at [place] rivastigmine 9.5 mg daily Continue mirtazapine 7.5 mg nightly.  Patient daughter plans to discuss with Dr. Wynetta Emery increasing this medication Follow-up with Dr. Wynetta Emery as scheduled

## 2022-02-20 NOTE — Assessment & Plan Note (Signed)
Chronic.  Stable.  Asymptomatic.  Last TSH within normal limits Refill levothyroxine 125 mcg daily Repeat TSH at next visit

## 2022-02-20 NOTE — Assessment & Plan Note (Signed)
Labs from 10/23 showed mild elevation of ALT.  Repeat labs from 11/23 showed rising ALT/AST.  Patient was referred to GI at that time for evaluation.  Currently on statin therapy.  Denies any abdominal symptoms. Scheduled GI appointment for March for evaluation of abnormal LFTs

## 2022-02-20 NOTE — Assessment & Plan Note (Signed)
Chronic.  Stable.  Currently on Lotrel.   Holding Lotrel for soft blood pressures and positive orthostatics

## 2022-02-20 NOTE — Assessment & Plan Note (Signed)
Recent BMI 16.48.  Essentially unchanged from the past year.  Patient reports she eats enough for her.  Daughter indicates that patient does not eat much.  Was recently started on mirtazapine 7.5 mg by Dr. Wynetta Emery at [facility] to help with increasing appetite.  Daughter reports that she does not find this is been beneficial and is hoping to discuss an increase in medication and at their next visit. Encouraged boost 3 times daily Encouraged finger foods to have easy access

## 2022-02-20 NOTE — Assessment & Plan Note (Signed)
Chronic.  Soft blood pressures currently asymptomatic. Endorses unsteadiness from sitting to standing position. Orthostatics positive Hold Lotrel Follow-up in 4 days at nurse clinic for repeat blood pressure check.  Will need to repeat orthostatic vitals as well. Monitor blood pressure at home.  If BP greater than 140/90 resume Lotrel.  If BP remains less than 100/60 continue to hold Lotrel Strict return precautions provided

## 2022-02-20 NOTE — Assessment & Plan Note (Signed)
Declines dermatology referral.

## 2022-02-20 NOTE — Assessment & Plan Note (Signed)
Previously elevated greater than 1500. Currently taking vitamin B12 1000 mcg's daily.   Recheck levels, if remains elevated can discontinue supplementation.

## 2022-02-20 NOTE — Assessment & Plan Note (Signed)
Chronic.  Stable.  Recent LDL less than 100.  On statin therapy and tolerating well.  No myalgias. Continue Lipitor 10 mg daily Repeat fasting lipids

## 2022-02-21 ENCOUNTER — Ambulatory Visit: Payer: Medicare Other

## 2022-02-22 ENCOUNTER — Telehealth: Payer: Self-pay

## 2022-02-22 ENCOUNTER — Encounter: Payer: Self-pay | Admitting: Family Medicine

## 2022-02-22 ENCOUNTER — Encounter: Payer: Self-pay | Admitting: Internal Medicine

## 2022-02-22 ENCOUNTER — Ambulatory Visit (INDEPENDENT_AMBULATORY_CARE_PROVIDER_SITE_OTHER): Payer: Medicare Other | Admitting: Family Medicine

## 2022-02-22 VITALS — BP 124/84 | HR 67 | Temp 98.1°F | Resp 14 | Ht 67.0 in | Wt 109.0 lb

## 2022-02-22 DIAGNOSIS — F0280 Dementia in other diseases classified elsewhere without behavioral disturbance: Secondary | ICD-10-CM | POA: Diagnosis not present

## 2022-02-22 DIAGNOSIS — I150 Renovascular hypertension: Secondary | ICD-10-CM | POA: Diagnosis not present

## 2022-02-22 DIAGNOSIS — G308 Other Alzheimer's disease: Secondary | ICD-10-CM | POA: Diagnosis not present

## 2022-02-22 MED ORDER — AMLODIPINE BESYLATE 2.5 MG PO TABS: 2.5000 mg | ORAL_TABLET | Freq: Every day | ORAL | 0 refills | Status: DC

## 2022-02-22 MED ORDER — AMLODIPINE BESY-BENAZEPRIL HCL 2.5-10 MG PO CAPS: 1.0000 | ORAL_CAPSULE | Freq: Every day | ORAL | 0 refills | Status: DC

## 2022-02-22 NOTE — Progress Notes (Signed)
   SUBJECTIVE:   Chief Complaint  Patient presents with   Medical Management of Chronic Issues   Hypertension   HPI  Patient presents to clinic with her husband for follow up blood pressure.  Was recently seen in clinic and orthostatic vitals positive.  Advised to stop Lotrel and monitor blood pressure at home.  Has been not sure what blood pressure was at home.  Has blood pressure cuff but not checking blood pressure regularly.  Was recently seen at memory clinic yesterday, reports blood pressure 142/92.  Was told to restart blood pressure medication.  Took 1 dose yesterday.  She denies any dizziness yesterday or today.  Poor historian given history of dementia.  PERTINENT PMH / PSH: HTN Orthostatic hypotension  OBJECTIVE:  BP 124/84   Pulse 67   Temp 98.1 F (36.7 C) (Temporal)   Resp 14   Ht '5\' 7"'$  (1.702 m)   Wt 109 lb (49.4 kg)   SpO2 98%   BMI 17.07 kg/m    Physical Exam Vitals reviewed.  Constitutional:      General: She is not in acute distress.    Appearance: Normal appearance. She is not ill-appearing, toxic-appearing or diaphoretic.  Eyes:     General:        Right eye: No discharge.        Left eye: No discharge.     Conjunctiva/sclera: Conjunctivae normal.  Cardiovascular:     Rate and Rhythm: Normal rate.  Pulmonary:     Effort: Pulmonary effort is normal.  Skin:    General: Skin is warm.  Neurological:     Mental Status: She is alert. Mental status is at baseline.  Psychiatric:        Mood and Affect: Mood normal.        Behavior: Behavior normal.     ASSESSMENT/PLAN:  Renovascular hypertension Assessment & Plan: Soft blood pressures.  Took Lotrel x 1 dose for blood pressure 142/92 at memory care yesterday.  Repeat orthostatics today positive. Decrease Lotrel to 2.5-10 mg daily Monitor blood pressure at home.   Follow-up in 1 week with nurse clinic for blood pressure check.  Orders: -     amLODIPine Besy-Benazepril HCl; Take 1 capsule by  mouth daily.  Dispense: 90 capsule; Refill: 0  Alzheimer's dementia of other onset, without behavioral disturbance, psychotic disturbance, mood disturbance, or anxiety, unspecified dementia severity ([place]) Assessment & Plan: Chronic.  Recently seen at memory care and mirtazapine was increased to 7.5 mg at night. Follow-up with Dr. Wynetta Emery as needed.    PDMP reviewed  Return in about 1 week (around 03/01/2022) for RN clinic, blood pressure .  Carollee Leitz, MD

## 2022-02-22 NOTE — Assessment & Plan Note (Addendum)
Soft blood pressures.  Took Lotrel x 1 dose for blood pressure 142/92 at memory care yesterday.  Repeat orthostatics today positive. Decrease Lotrel to 2.5-10 mg daily Monitor blood pressure at home.   Follow-up in 1 week with nurse clinic for blood pressure check.

## 2022-02-22 NOTE — Telephone Encounter (Signed)
Lm for pt/pt husband to cb re :  Changing medication

## 2022-02-22 NOTE — Assessment & Plan Note (Addendum)
Chronic.  Recently seen at memory care and mirtazapine was increased to 7.5 mg at night. Follow-up with Dr. Wynetta Emery as needed.

## 2022-02-22 NOTE — Patient Instructions (Addendum)
It was a pleasure meeting you today. Thank you for allowing me to take part in your health care.  Our goals for today as we discussed include:  Stop Lotrel 10-40 mg  Monitor blood pressure.  If greater than 140/90 start Lotrel 2.5-10 mg 1 tablet daily.  Schedule a nurse visit in 1 week for blood pressure check.   If you have any questions or concerns, please do not hesitate to call the office at [phone].  I look forward to our next visit and until then take care and stay safe.  Regards,   Carollee Leitz, MD   [facility]

## 2022-02-24 ENCOUNTER — Telehealth: Payer: Self-pay

## 2022-02-24 NOTE — Telephone Encounter (Signed)
Spoke to patient and requested that she bring SD card to 02/25/2022 appt.

## 2022-02-25 ENCOUNTER — Encounter: Payer: Self-pay | Admitting: Primary Care

## 2022-02-25 ENCOUNTER — Ambulatory Visit (INDEPENDENT_AMBULATORY_CARE_PROVIDER_SITE_OTHER): Payer: Medicare Other | Admitting: Primary Care

## 2022-02-25 VITALS — BP 118/78 | HR 70 | Temp 97.3°F | Ht 67.0 in | Wt 109.8 lb

## 2022-02-25 DIAGNOSIS — G4730 Sleep apnea, unspecified: Secondary | ICD-10-CM

## 2022-02-25 DIAGNOSIS — G4733 Obstructive sleep apnea (adult) (pediatric): Secondary | ICD-10-CM | POA: Diagnosis not present

## 2022-02-25 NOTE — Progress Notes (Unsigned)
$'@Patient'T$  ID: Laura Carpenter, female    DOB: [birth year], 87 y.o.   MRN: [id number]  No chief complaint on file.   Referring provider: Burnard Hawthorne, FNP  HPI: 87 year old female, former smoker. PMH significant for hypertension, sleep apnea, hypothyroidism, hx breast cancer, polycythemia, B12 deficiency, dementia.   02/25/2022 Patient presents today for sleep consult. Hx sleep apnea on CPAP.  Patient had sleep study on 10/28/2014 that showed moderate obstructive sleep apnea, AHI 21.4/hour with spo2 low 79%. CPAP titration study on 12/01/2014 determined optimal pressure 11cm h20.   She is doing well. Using CPAP nightly. Will take it up when getting up to use the restroom and does not put it back on. She sleeps well at night. Husband does states that she snores. Typical bedtime is 10pm. It does not take her long to fall asleep. She wakes up on average once a night. She starts her day at 8am.    Patient has not been keeping up with changing CPAP supplies regularly. Filter has never been changed, they do have two spare ones.    Airview download 11/27/21-02/24/22 Usage 87/90 days (97%); 56 days (62%) > 4 hours Average usage 5 hours 12 mins Pressure 11cm h20 Airleaks 108L/min (95%) AHI 3.7    Allergies  Allergen Reactions   Aricept [Donepezil Hcl]     GI upset     Immunization History  Administered Date(s) Administered   Influenza Inj Mdck Quad Pf 10/25/2017   Influenza Split 11/22/2013   Influenza, High Dose Seasonal PF 10/05/2018, 10/25/2019, 09/17/2020   Influenza,inj,Quad PF,6+ Mos 10/25/2017   Influenza-Unspecified 11/10/2011, 11/22/2013, 10/25/2014, 10/19/2015, 10/11/2016   PFIZER(Purple Top)SARS-COV-2 Vaccination 01/29/2019, 02/19/2019, 10/09/2019, 09/17/2020   Pneumococcal Conjugate-13 06/05/2017   Pneumococcal Polysaccharide-23 02/04/2013   Zoster Recombinat (Shingrix) 04/01/2017, 05/02/2017, 08/16/2017    Past Medical History:  Diagnosis Date   Actinic keratosis  05/08/2019   Breast cancer ([place])    02/17/10: right lumpectomy reveals 0.8 cm of invasive ductal carcinoma with 1/3 LN+ for isolated tumor cell cluster in Oncotype recurrent score 19 or 12% chance of distant recurrence; Femara self-discontinued secondary to side effects. She continues to decline further therapy   Chronic hyponatremia    Hyponatremia for which she has had since somewhere in the mid 1990's. She's had multiple hospitalization, one hospitalization in 1993, she was diagnosed with an EF of 25% as well, though coronaries were clean during that hospitalization. The cath showed an EF of 50%. She is being worked up for her hyponatremia at [facility].   Chronic hyponatremia    Hyponatremia for which she has had since somewhere in the mid 1990's. She's had multiple hospitalization, one hospitalization in 1993, she was diagnosed with an EF of 25% as well, though coronaries were clean during that hospitalization. The cath showed an EF of 50%. She is being worked up for her hyponatremia at [facility].   Dehydration 06/29/2015   Dementia ([place])    Dizziness    Dizziness 12/26/2011   Excessive cerumen in both ear canals 06/09/2020   Fall at home 09/24/2015   Fibromuscular dysplasia of renal artery [facility])    1997 Renal angioplasty at [facility]. She subsequently underwent repeat angioplasty a few years later. 12/2009 Abdominal Duplex: <60% bilateral RAS, Sequential narrowing and dilations of renal arteries, suggests fibromuscular dysplasia.    H/O electrolyte imbalance    Hearing loss    Hemorrhoid    History of radiation therapy 2011   Hypertension    Hyponatremia 06/28/2018  Hypothyroidism    Ingrowing nail, left great toe    Ingrowing nail, left great toe 03/25/2014   Non-melanoma skin cancer    right hand   Otalgia of right ear 11/10/2011   Otalgia of right ear 11/10/2011   Polycythemia    Right ankle pain 10/28/2020   Senile dementia of Alzheimer's type ([place]) 06/29/2018   Established [facility]    Skin cancer    squamous cell skin cancer removed by Dermatology--arms and hands   Skin lesion of left leg 05/29/2012   Sleep apnea    Uterine fibroid 06/28/2018   UTI (urinary tract infection)    Vertigo 12/26/2011    Tobacco History: Social History   Tobacco Use  Smoking Status Former   Packs/day: 0.25   Years: 20.00   Total pack years: 5.00   Types: Cigarettes  Smokeless Tobacco Former   Quit date: 05/30/1976  Tobacco Comments   early teens; early college smoking Quit: 05/30/1976   Counseling given: Not Answered Tobacco comments: early teens; early college smoking Quit: 05/30/1976   Outpatient Medications Prior to Visit  Medication Sig Dispense Refill   amlodipine-benazepril (LOTREL) 2.5-10 MG capsule Take 1 capsule by mouth daily. 90 capsule 0   aspirin 81 MG tablet Take 81 mg by mouth daily.     atorvastatin (LIPITOR) 10 MG tablet TAKE ONE TABLET BY MOUTH EVERY DAY AT 6PM 90 tablet 3   Cholecalciferol (VITAMIN D3) 50 MCG (2000 UT) TABS Take 4,000 Units by mouth daily.     levothyroxine (SYNTHROID) 125 MCG tablet Take 1 tablet (125 mcg total) by mouth daily before breakfast. 90 tablet 3   mirtazapine (REMERON) 7.5 MG tablet Take 7.5 mg by mouth at bedtime. Take 1 tablet daily     rivastigmine (EXELON) 9.5 mg/24hr      vitamin B-12 (CYANOCOBALAMIN) 1000 MCG tablet Take by mouth.     No facility-administered medications prior to visit.    Review of Systems  Review of Systems  Constitutional: Negative.   HENT: Negative.    Respiratory: Negative.    Cardiovascular: Negative.    Physical Exam  There were no vitals taken for this visit. Physical Exam Constitutional:      General: She is not in acute distress.    Appearance: Normal appearance. She is not ill-appearing.     Comments: Underweight   HENT:     Head: Normocephalic and atraumatic.  Cardiovascular:     Rate and Rhythm: Normal rate and regular rhythm.  Pulmonary:     Effort: Pulmonary effort is normal.      Breath sounds: Normal breath sounds.  Neurological:     General: No focal deficit present.     Mental Status: She is alert and oriented to person, place, and time. Mental status is at baseline.  Psychiatric:        Mood and Affect: Mood normal.        Behavior: Behavior normal.        Thought Content: Thought content normal.        Judgment: Judgment normal.      Lab Results:  CBC    Component Value Date/Time   WBC 5.9 11/11/2021 1418   RBC 4.28 11/11/2021 1418   HGB 14.0 11/11/2021 1418   HCT 41.1 11/11/2021 1418   PLT 220.0 11/11/2021 1418   MCV 96.1 11/11/2021 1418   MCH 33.0 06/29/2015 1355   MCHC 34.0 11/11/2021 1418   RDW 13.9 11/11/2021 1418   LYMPHSABS  1.4 11/11/2021 1418   MONOABS 0.6 11/11/2021 1418   EOSABS 0.0 11/11/2021 1418   BASOSABS 0.0 11/11/2021 1418    BMET    Component Value Date/Time   NA 136 11/11/2021 1418   K 3.9 11/11/2021 1418   CL 98 11/11/2021 1418   CO2 27 11/11/2021 1418   GLUCOSE 92 11/11/2021 1418   BUN 20 11/11/2021 1418   CREATININE 1.02 11/11/2021 1418   CALCIUM 10.3 11/11/2021 1418   GFRNONAA 23 (L) 06/29/2015 1355   GFRAA 27 (L) 06/29/2015 1355    BNP No results found for: "BNP"  ProBNP No results found for: "PROBNP"  Imaging: No results found.   Assessment & Plan:   No problem-specific Assessment & Plan notes found for this encounter.     Martyn Ehrich, NP 02/25/2022

## 2022-02-25 NOTE — Patient Instructions (Addendum)
Download shows sleep apnea is controlled on current CPAP pressure settings  Make sure to change CPAP filter monthly and tubing/mask every 3 months  You need to use distilled water if using water chamber  Orders: New CPAP machine at pressure 11cm h20 (high priority- sleep study 10/28/2014) Needs new CPAP supplies/ filters as soon as possible   Follow-up: 3 months with Beth NP   CPAP and BIPAP Information CPAP and BIPAP are methods that use air pressure to keep your airways open and to help you breathe well. CPAP and BIPAP use different amounts of pressure. Your health care provider will tell you whether CPAP or BIPAP would be more helpful for you. CPAP stands for "continuous positive airway pressure." With CPAP, the amount of pressure stays the same while you breathe in (inhale) and out (exhale). BIPAP stands for "bi-level positive airway pressure." With BIPAP, the amount of pressure will be higher when you inhale and lower when you exhale. This allows you to take larger breaths. CPAP or BIPAP may be used in the hospital, or your health care provider may want you to use it at home. You may need to have a sleep study before your health care provider can order a machine for you to use at home. What are the advantages? CPAP or BIPAP can be helpful if you have: Sleep apnea. Chronic obstructive pulmonary disease (COPD). Heart failure. Medical conditions that cause muscle weakness, including muscular dystrophy or amyotrophic lateral sclerosis (ALS). Other problems that cause breathing to be shallow, weak, abnormal, or difficult. CPAP and BIPAP are most commonly used for obstructive sleep apnea (OSA) to keep the airways from collapsing when the muscles relax during sleep. What are the risks? Generally, this is a safe treatment. However, problems may occur, including: Irritated skin or skin sores if the mask does not fit properly. Dry or stuffy nose or nosebleeds. Dry mouth. Feeling gassy or  bloated. Sinus or lung infection if the equipment is not cleaned properly. When should CPAP or BIPAP be used? In most cases, the mask only needs to be worn during sleep. Generally, the mask needs to be worn throughout the night and during any daytime naps. People with certain medical conditions may also need to wear the mask at other times, such as when they are awake. Follow instructions from your health care provider about when to use the machine. What happens during CPAP or BIPAP?  Both CPAP and BIPAP are provided by a small machine with a flexible plastic tube that attaches to a plastic mask that you wear. Air is blown through the mask into your nose or mouth. The amount of pressure that is used to blow the air can be adjusted on the machine. Your health care provider will set the pressure setting and help you find the best mask for you. Tips for using the mask Because the mask needs to be snug, some people feel trapped or closed-in (claustrophobic) when first using the mask. If you feel this way, you may need to get used to the mask. One way to do this is to hold the mask loosely over your nose or mouth and then gradually apply the mask more snugly. You can also gradually increase the amount of time that you use the mask. Masks are available in various types and sizes. If your mask does not fit well, talk with your health care provider about getting a different one. Some common types of masks include: Full face masks, which fit over the  mouth and nose. Nasal masks, which fit over the nose. Nasal pillow or prong masks, which fit into the nostrils. If you are using a mask that fits over your nose and you tend to breathe through your mouth, a chin strap may be applied to help keep your mouth closed. Use a skin barrier to protect your skin as told by your health care provider. Some CPAP and BIPAP machines have alarms that may sound if the mask comes off or develops a leak. If you have trouble with  the mask, it is very important that you talk with your health care provider about finding a way to make the mask easier to tolerate. Do not stop using the mask. There could be a negative impact on your health if you stop using the mask. Tips for using the machine Place your CPAP or BIPAP machine on a secure table or stand near an electrical outlet. Know where the on/off switch is on the machine. Follow instructions from your health care provider about how to set the pressure on your machine and when you should use it. Do not eat or drink while the CPAP or BIPAP machine is on. Food or fluids could get pushed into your lungs by the pressure of the CPAP or BIPAP. For home use, CPAP and BIPAP machines can be rented or purchased through home health care companies. Many different brands of machines are available. Renting a machine before purchasing may help you find out which particular machine works well for you. Your health insurance company may also decide which machine you may get. Keep the CPAP or BIPAP machine and attachments clean. Ask your health care provider for specific instructions. Check the humidifier if you have a dry stuffy nose or nosebleeds. Make sure it is working correctly. Follow these instructions at home: Take over-the-counter and prescription medicines only as told by your health care provider. Ask if you can take sinus medicine if your sinuses are blocked. Do not use any products that contain nicotine or tobacco. These products include cigarettes, chewing tobacco, and vaping devices, such as e-cigarettes. If you need help quitting, ask your health care provider. Keep all follow-up visits. This is important. Contact a health care provider if: You have redness or pressure sores on your head, face, mouth, or nose from the mask or head gear. You have trouble using the CPAP or BIPAP machine. You cannot tolerate wearing the CPAP or BIPAP mask. Someone tells you that you snore even when  wearing your CPAP or BIPAP. Get help right away if: You have trouble breathing. You feel confused. Summary CPAP and BIPAP are methods that use air pressure to keep your airways open and to help you breathe well. If you have trouble with the mask, it is very important that you talk with your health care provider about finding a way to make the mask easier to tolerate. Do not stop using the mask. There could be a negative impact to your health if you stop using the mask. Follow instructions from your health care provider about when to use the machine. This information is not intended to replace advice given to you by your health care provider. Make sure you discuss any questions you have with your health care provider. Document Revised: 08/19/2020 Document Reviewed: 12/20/2019 Elsevier Patient Education  [place].

## 2022-02-28 NOTE — Assessment & Plan Note (Signed)
-   Sleep study 10/28/2014 >> moderate OSA, AHI 21.4/hour - CPAP titration study done on 12/01/2014 optimal pressure 11cm h20 - Patient is 97% compliant with use last 90 days. Current pressure 11cm h20 with residual AHI 3.7/hr. Large amt of air leaks  - Needs new machine and supplies - DME order placed - FU in 3 months or sooner if needed

## 2022-03-01 ENCOUNTER — Ambulatory Visit: Payer: Medicare Other

## 2022-03-01 DIAGNOSIS — I10 Essential (primary) hypertension: Secondary | ICD-10-CM

## 2022-03-01 NOTE — Progress Notes (Signed)
Patient presented for BP check. BP was taken in her  to left arm. Pt BP was 128/82 and o2 was 95 pulse was 80...the patient brought her cuff from home and got a reading of 130/92 and o2 was 95 and 84. Pt does not have the proper size cuff for her arm so therefore that could be the reason for abnormal readings also. I suggested that they get a smaller  cuff for her to use at home.

## 2022-04-04 ENCOUNTER — Other Ambulatory Visit: Payer: Self-pay | Admitting: Gastroenterology

## 2022-04-04 DIAGNOSIS — R7989 Other specified abnormal findings of blood chemistry: Secondary | ICD-10-CM

## 2022-04-04 DIAGNOSIS — R634 Abnormal weight loss: Secondary | ICD-10-CM

## 2022-04-13 ENCOUNTER — Ambulatory Visit
Admission: RE | Admit: 2022-04-13 | Discharge: 2022-04-13 | Disposition: A | Discharge status: Home / Self Care | Payer: Medicare Other | Source: Ambulatory Visit | Attending: Gastroenterology | Admitting: Gastroenterology

## 2022-04-13 DIAGNOSIS — R7989 Other specified abnormal findings of blood chemistry: Secondary | ICD-10-CM | POA: Insufficient documentation

## 2022-04-13 DIAGNOSIS — R634 Abnormal weight loss: Secondary | ICD-10-CM

## 2022-04-13 MED ORDER — IOHEXOL 9 MG/ML PO SOLN
500.0000 mL | ORAL | Status: AC
  Administered 2022-04-13 (×2): 500 mL

## 2022-04-13 MED ORDER — IOHEXOL 300 MG/ML  SOLN
80.0000 mL | Freq: Once | INTRAMUSCULAR | Status: AC | PRN
  Administered 2022-04-13: 80 mL via INTRAVENOUS

## 2022-05-10 ENCOUNTER — Other Ambulatory Visit: Payer: Self-pay | Admitting: Family Medicine

## 2022-05-10 DIAGNOSIS — I150 Renovascular hypertension: Secondary | ICD-10-CM

## 2022-05-18 ENCOUNTER — Other Ambulatory Visit: Payer: Self-pay | Admitting: Family Medicine

## 2022-05-18 DIAGNOSIS — I150 Renovascular hypertension: Secondary | ICD-10-CM

## 2022-05-19 ENCOUNTER — Ambulatory Visit: Payer: Medicare Other | Admitting: Family Medicine

## 2022-06-18 ENCOUNTER — Emergency Department: Payer: Medicare Other

## 2022-06-18 ENCOUNTER — Inpatient Hospital Stay
Admission: EM | Admit: 2022-06-18 | Discharge: 2022-06-23 | DRG: 085 | Disposition: A | Discharge status: Skilled Nursing Facility | Payer: Medicare Other | Attending: Internal Medicine | Admitting: Internal Medicine

## 2022-06-18 ENCOUNTER — Observation Stay: Payer: Medicare Other

## 2022-06-18 ENCOUNTER — Other Ambulatory Visit: Payer: Self-pay

## 2022-06-18 DIAGNOSIS — Z853 Personal history of malignant neoplasm of breast: Secondary | ICD-10-CM

## 2022-06-18 DIAGNOSIS — G9341 Metabolic encephalopathy: Secondary | ICD-10-CM

## 2022-06-18 DIAGNOSIS — R339 Retention of urine, unspecified: Secondary | ICD-10-CM | POA: Diagnosis present

## 2022-06-18 DIAGNOSIS — S065X0A Traumatic subdural hemorrhage without loss of consciousness, initial encounter: Principal | ICD-10-CM | POA: Diagnosis present

## 2022-06-18 DIAGNOSIS — F05 Delirium due to known physiological condition: Secondary | ICD-10-CM | POA: Diagnosis present

## 2022-06-18 DIAGNOSIS — H9190 Unspecified hearing loss, unspecified ear: Secondary | ICD-10-CM | POA: Diagnosis present

## 2022-06-18 DIAGNOSIS — E785 Hyperlipidemia, unspecified: Secondary | ICD-10-CM | POA: Diagnosis present

## 2022-06-18 DIAGNOSIS — Z923 Personal history of irradiation: Secondary | ICD-10-CM

## 2022-06-18 DIAGNOSIS — S065XAA Traumatic subdural hemorrhage with loss of consciousness status unknown, initial encounter: Secondary | ICD-10-CM | POA: Diagnosis not present

## 2022-06-18 DIAGNOSIS — Y92009 Unspecified place in unspecified non-institutional (private) residence as the place of occurrence of the external cause: Secondary | ICD-10-CM

## 2022-06-18 DIAGNOSIS — F0390 Unspecified dementia without behavioral disturbance: Secondary | ICD-10-CM | POA: Diagnosis present

## 2022-06-18 DIAGNOSIS — E039 Hypothyroidism, unspecified: Secondary | ICD-10-CM | POA: Diagnosis present

## 2022-06-18 DIAGNOSIS — Z9181 History of falling: Secondary | ICD-10-CM

## 2022-06-18 DIAGNOSIS — M25512 Pain in left shoulder: Secondary | ICD-10-CM

## 2022-06-18 DIAGNOSIS — I4891 Unspecified atrial fibrillation: Secondary | ICD-10-CM | POA: Diagnosis present

## 2022-06-18 DIAGNOSIS — E871 Hypo-osmolality and hyponatremia: Secondary | ICD-10-CM | POA: Diagnosis not present

## 2022-06-18 DIAGNOSIS — R410 Disorientation, unspecified: Secondary | ICD-10-CM

## 2022-06-18 DIAGNOSIS — J9601 Acute respiratory failure with hypoxia: Secondary | ICD-10-CM | POA: Diagnosis present

## 2022-06-18 DIAGNOSIS — R296 Repeated falls: Secondary | ICD-10-CM | POA: Diagnosis present

## 2022-06-18 DIAGNOSIS — E222 Syndrome of inappropriate secretion of antidiuretic hormone: Secondary | ICD-10-CM | POA: Diagnosis present

## 2022-06-18 DIAGNOSIS — Z170 Estrogen receptor positive status [ER+]: Secondary | ICD-10-CM

## 2022-06-18 DIAGNOSIS — W19XXXA Unspecified fall, initial encounter: Secondary | ICD-10-CM

## 2022-06-18 DIAGNOSIS — Z66 Do not resuscitate: Secondary | ICD-10-CM | POA: Diagnosis present

## 2022-06-18 DIAGNOSIS — R4182 Altered mental status, unspecified: Secondary | ICD-10-CM

## 2022-06-18 DIAGNOSIS — B9781 Human metapneumovirus as the cause of diseases classified elsewhere: Secondary | ICD-10-CM | POA: Diagnosis present

## 2022-06-18 DIAGNOSIS — Z811 Family history of alcohol abuse and dependence: Secondary | ICD-10-CM

## 2022-06-18 DIAGNOSIS — S066X0A Traumatic subarachnoid hemorrhage without loss of consciousness, initial encounter: Secondary | ICD-10-CM | POA: Diagnosis present

## 2022-06-18 DIAGNOSIS — Z85828 Personal history of other malignant neoplasm of skin: Secondary | ICD-10-CM

## 2022-06-18 DIAGNOSIS — E43 Unspecified severe protein-calorie malnutrition: Secondary | ICD-10-CM | POA: Diagnosis present

## 2022-06-18 DIAGNOSIS — G301 Alzheimer's disease with late onset: Secondary | ICD-10-CM | POA: Diagnosis present

## 2022-06-18 DIAGNOSIS — Z79890 Hormone replacement therapy (postmenopausal): Secondary | ICD-10-CM

## 2022-06-18 DIAGNOSIS — R5381 Other malaise: Secondary | ICD-10-CM | POA: Diagnosis present

## 2022-06-18 DIAGNOSIS — Z7982 Long term (current) use of aspirin: Secondary | ICD-10-CM

## 2022-06-18 DIAGNOSIS — Z8249 Family history of ischemic heart disease and other diseases of the circulatory system: Secondary | ICD-10-CM

## 2022-06-18 DIAGNOSIS — Z888 Allergy status to other drugs, medicaments and biological substances status: Secondary | ICD-10-CM

## 2022-06-18 DIAGNOSIS — W010XXA Fall on same level from slipping, tripping and stumbling without subsequent striking against object, initial encounter: Secondary | ICD-10-CM | POA: Diagnosis present

## 2022-06-18 DIAGNOSIS — I10 Essential (primary) hypertension: Secondary | ICD-10-CM | POA: Diagnosis present

## 2022-06-18 DIAGNOSIS — Z87891 Personal history of nicotine dependence: Secondary | ICD-10-CM

## 2022-06-18 DIAGNOSIS — F02B0 Dementia in other diseases classified elsewhere, moderate, without behavioral disturbance, psychotic disturbance, mood disturbance, and anxiety: Secondary | ICD-10-CM

## 2022-06-18 DIAGNOSIS — D751 Secondary polycythemia: Secondary | ICD-10-CM

## 2022-06-18 DIAGNOSIS — Z79899 Other long term (current) drug therapy: Secondary | ICD-10-CM

## 2022-06-18 DIAGNOSIS — Z681 Body mass index (BMI) 19 or less, adult: Secondary | ICD-10-CM

## 2022-06-18 DIAGNOSIS — M1990 Unspecified osteoarthritis, unspecified site: Secondary | ICD-10-CM | POA: Diagnosis present

## 2022-06-18 HISTORY — DX: Obstructive sleep apnea (adult) (pediatric): G47.33

## 2022-06-18 LAB — BASIC METABOLIC PANEL
Anion gap: 12 (ref 5–15)
BUN: 14 mg/dL (ref 8–23)
CO2: 25 mmol/L (ref 22–32)
Calcium: 9.3 mg/dL (ref 8.9–10.3)
Chloride: 89 mmol/L — ABNORMAL LOW (ref 98–111)
Creatinine, Ser: 0.63 mg/dL (ref 0.44–1.00)
GFR, Estimated: >60 mL/min (ref >60)
Glucose, Bld: 130 mg/dL — ABNORMAL HIGH (ref 70–99)
Potassium: 3.4 mmol/L — ABNORMAL LOW (ref 3.5–5.1)
Sodium: 126 mmol/L — ABNORMAL LOW (ref 135–145)

## 2022-06-18 LAB — URINALYSIS, ROUTINE W REFLEX MICROSCOPIC
Bilirubin Urine: NEGATIVE
Glucose, UA: NEGATIVE mg/dL
Ketones, ur: 5 mg/dL — AB
Nitrite: NEGATIVE
Protein, ur: 30 mg/dL — AB
Specific Gravity, Urine: 1.019 (ref 1.005–1.030)
WBC, UA: >50 WBC/hpf (ref 0–5)
pH: 5 (ref 5.0–8.0)

## 2022-06-18 LAB — CBC WITH DIFFERENTIAL/PLATELET
Abs Immature Granulocytes: 0.01 10*3/uL (ref 0.00–0.07)
Basophils Absolute: 0 10*3/uL (ref 0.0–0.1)
Basophils Relative: 0 %
Eosinophils Absolute: 0 10*3/uL (ref 0.0–0.5)
Eosinophils Relative: 0 %
HCT: 41.9 % (ref 36.0–46.0)
Hemoglobin: 14.3 g/dL (ref 12.0–15.0)
Immature Granulocytes: 0 %
Lymphocytes Relative: 11 %
Lymphs Abs: 0.5 10*3/uL — ABNORMAL LOW (ref 0.7–4.0)
MCH: 32 pg (ref 26.0–34.0)
MCHC: 34.1 g/dL (ref 30.0–36.0)
MCV: 93.7 fL (ref 80.0–100.0)
Monocytes Absolute: 0.6 10*3/uL (ref 0.1–1.0)
Monocytes Relative: 12 %
Neutro Abs: 4 10*3/uL (ref 1.7–7.7)
Neutrophils Relative %: 77 %
Platelets: 184 10*3/uL (ref 150–400)
RBC: 4.47 MIL/uL (ref 3.87–5.11)
RDW: 12.9 % (ref 11.5–15.5)
WBC: 5.2 10*3/uL (ref 4.0–10.5)
nRBC: 0 % (ref 0.0–0.2)

## 2022-06-18 LAB — COMPREHENSIVE METABOLIC PANEL
ALT: 43 U/L (ref 0–44)
AST: 56 U/L — ABNORMAL HIGH (ref 15–41)
Albumin: 4.4 g/dL (ref 3.5–5.0)
Alkaline Phosphatase: 92 U/L (ref 38–126)
Anion gap: 8 (ref 5–15)
BUN: 19 mg/dL (ref 8–23)
CO2: 26 mmol/L (ref 22–32)
Calcium: 9.7 mg/dL (ref 8.9–10.3)
Chloride: 94 mmol/L — ABNORMAL LOW (ref 98–111)
Creatinine, Ser: 0.79 mg/dL (ref 0.44–1.00)
GFR, Estimated: >60 mL/min (ref >60)
Glucose, Bld: 124 mg/dL — ABNORMAL HIGH (ref 70–99)
Potassium: 4.1 mmol/L (ref 3.5–5.1)
Sodium: 128 mmol/L — ABNORMAL LOW (ref 135–145)
Total Bilirubin: 0.7 mg/dL (ref 0.3–1.2)
Total Protein: 7.5 g/dL (ref 6.5–8.1)

## 2022-06-18 LAB — NA AND K (SODIUM & POTASSIUM), RAND UR
Potassium Urine: 57 mmol/L
Sodium, Ur: 86 mmol/L

## 2022-06-18 LAB — D-DIMER, QUANTITATIVE: D-Dimer, Quant: 0.78 ug/mL-FEU — ABNORMAL HIGH (ref 0.00–0.50)

## 2022-06-18 LAB — LACTIC ACID, PLASMA
Lactic Acid, Venous: 1.1 mmol/L (ref 0.5–1.9)
Lactic Acid, Venous: 1 mmol/L (ref 0.5–1.9)

## 2022-06-18 LAB — OSMOLALITY: Osmolality: 266 mOsm/kg — ABNORMAL LOW (ref 275–295)

## 2022-06-18 LAB — PROCALCITONIN: Procalcitonin: <0.1 ng/mL

## 2022-06-18 LAB — MAGNESIUM: Magnesium: 1.9 mg/dL (ref 1.7–2.4)

## 2022-06-18 LAB — BRAIN NATRIURETIC PEPTIDE: B Natriuretic Peptide: 171.2 pg/mL — ABNORMAL HIGH (ref 0.0–100.0)

## 2022-06-18 LAB — OSMOLALITY, URINE: Osmolality, Ur: 711 mOsm/kg (ref 300–900)

## 2022-06-18 LAB — TSH: TSH: 2.619 u[IU]/mL (ref 0.350–4.500)

## 2022-06-18 MED ORDER — ACETAMINOPHEN 650 MG RE SUPP: 650.0000 mg | Freq: Four times a day (QID) | RECTAL | Status: DC | PRN

## 2022-06-18 MED ORDER — SODIUM CHLORIDE 0.9 % IV SOLN
1.0000 g | INTRAVENOUS | Status: DC
  Administered 2022-06-18: 1 g via INTRAVENOUS
  Filled 2022-06-18: qty 10

## 2022-06-18 MED ORDER — SODIUM CHLORIDE 0.9 % IV SOLN: INTRAVENOUS | Status: DC

## 2022-06-18 MED ORDER — BENAZEPRIL HCL 10 MG PO TABS
10.0000 mg | ORAL_TABLET | Freq: Every day | ORAL | Status: DC
  Administered 2022-06-19 – 2022-06-20 (×2): 10 mg via ORAL
  Filled 2022-06-18 (×2): qty 1

## 2022-06-18 MED ORDER — ADULT MULTIVITAMIN W/MINERALS CH
1.0000 | ORAL_TABLET | Freq: Every day | ORAL | Status: DC
  Administered 2022-06-18 – 2022-06-23 (×5): 1 via ORAL
  Filled 2022-06-18 (×6): qty 1

## 2022-06-18 MED ORDER — LABETALOL HCL 5 MG/ML IV SOLN
10.0000 mg | INTRAVENOUS | Status: DC | PRN
  Administered 2022-06-18: 10 mg via INTRAVENOUS
  Filled 2022-06-18: qty 4

## 2022-06-18 MED ORDER — RIVASTIGMINE 9.5 MG/24HR TD PT24
9.5000 mg | MEDICATED_PATCH | Freq: Every day | TRANSDERMAL | Status: DC
  Administered 2022-06-19 – 2022-06-23 (×5): 9.5 mg via TRANSDERMAL
  Filled 2022-06-18 (×5): qty 1

## 2022-06-18 MED ORDER — LEVOTHYROXINE SODIUM 25 MCG PO TABS
125.0000 ug | ORAL_TABLET | Freq: Every day | ORAL | Status: DC
  Administered 2022-06-19 – 2022-06-23 (×5): 125 ug via ORAL
  Filled 2022-06-18 (×5): qty 1

## 2022-06-18 MED ORDER — THIAMINE MONONITRATE 100 MG PO TABS
100.0000 mg | ORAL_TABLET | Freq: Every day | ORAL | Status: DC
  Administered 2022-06-18 – 2022-06-23 (×6): 100 mg via ORAL
  Filled 2022-06-18 (×6): qty 1

## 2022-06-18 MED ORDER — ONDANSETRON HCL 4 MG PO TABS: 4.0000 mg | ORAL_TABLET | Freq: Four times a day (QID) | ORAL | Status: DC | PRN

## 2022-06-18 MED ORDER — LABETALOL HCL 5 MG/ML IV SOLN
10.0000 mg | INTRAVENOUS | Status: DC | PRN
  Administered 2022-06-19 (×2): 10 mg via INTRAVENOUS
  Filled 2022-06-18 (×2): qty 4

## 2022-06-18 MED ORDER — AMLODIPINE BESYLATE 5 MG PO TABS
2.5000 mg | ORAL_TABLET | Freq: Every day | ORAL | Status: DC
  Administered 2022-06-19 – 2022-06-20 (×2): 2.5 mg via ORAL
  Filled 2022-06-18 (×2): qty 1

## 2022-06-18 MED ORDER — SODIUM CHLORIDE 0.9 % IV BOLUS
1000.0000 mL | Freq: Once | INTRAVENOUS | Status: AC
  Administered 2022-06-18: 1000 mL via INTRAVENOUS

## 2022-06-18 MED ORDER — ACETAMINOPHEN 325 MG PO TABS
650.0000 mg | ORAL_TABLET | Freq: Four times a day (QID) | ORAL | Status: DC | PRN
  Administered 2022-06-18: 650 mg via ORAL
  Filled 2022-06-18: qty 2

## 2022-06-18 MED ORDER — VITAMIN B-12 1000 MCG PO TABS
1000.0000 ug | ORAL_TABLET | Freq: Every day | ORAL | Status: DC
  Administered 2022-06-19 – 2022-06-22 (×4): 1000 ug via ORAL
  Filled 2022-06-18 (×4): qty 1

## 2022-06-18 MED ORDER — ATORVASTATIN CALCIUM 20 MG PO TABS
10.0000 mg | ORAL_TABLET | Freq: Every day | ORAL | Status: DC
  Administered 2022-06-19 – 2022-06-23 (×5): 10 mg via ORAL
  Filled 2022-06-18 (×5): qty 1

## 2022-06-18 MED ORDER — SODIUM CHLORIDE 0.9 % IV SOLN
500.0000 mg | INTRAVENOUS | Status: DC
  Administered 2022-06-19: 500 mg via INTRAVENOUS
  Filled 2022-06-18: qty 5

## 2022-06-18 MED ORDER — POTASSIUM CHLORIDE CRYS ER 20 MEQ PO TBCR
40.0000 meq | EXTENDED_RELEASE_TABLET | Freq: Once | ORAL | Status: AC
  Administered 2022-06-18: 40 meq via ORAL
  Filled 2022-06-18: qty 2

## 2022-06-18 MED ORDER — SODIUM CHLORIDE 0.9% FLUSH
3.0000 mL | Freq: Two times a day (BID) | INTRAVENOUS | Status: DC
  Administered 2022-06-18 – 2022-06-23 (×10): 3 mL via INTRAVENOUS

## 2022-06-18 MED ORDER — FOLIC ACID 1 MG PO TABS
1.0000 mg | ORAL_TABLET | Freq: Every day | ORAL | Status: DC
  Administered 2022-06-18 – 2022-06-23 (×6): 1 mg via ORAL
  Filled 2022-06-18 (×6): qty 1

## 2022-06-18 MED ORDER — MIRTAZAPINE 15 MG PO TABS
7.5000 mg | ORAL_TABLET | Freq: Every day | ORAL | Status: DC
  Administered 2022-06-18 – 2022-06-19 (×2): 7.5 mg via ORAL
  Filled 2022-06-18 (×3): qty 1

## 2022-06-18 MED ORDER — ONDANSETRON HCL 4 MG/2ML IJ SOLN: 4.0000 mg | Freq: Four times a day (QID) | INTRAMUSCULAR | Status: DC | PRN

## 2022-06-18 NOTE — H&P (Signed)
History and Physical    Patient: [name] NFA:[id number] DOB: [birth year] DOA: 06/18/2022 DOS: the patient was seen and examined on 06/18/2022 PCP: Laura Allan, MD  Patient coming from: Home  Chief Complaint:  Chief Complaint  Patient presents with   Fall   Arm Injury   HPI: Laura Carpenter is a 87 y.o. female with medical history significant of Alzheimer's dementia, chronic hyponatremia, hypertension, hyperlipidemia, hypothyroidism, fibromuscular dysplasia of the renal artery, who presents to the ED due to a ground-level fall.  History obtained from both daughter at bedside and patient due to patient's history of dementia.  Patient's daughter Laura Carpenter states that on 5/23, patient was walking down the driveway with patient's husband.  The driveway is down sloped and Laura Carpenter lost her footing and fell face forward landing onto the left side of her head and left shoulder.  Laura Carpenter did not notice any loss of consciousness and patient was able to get up and walk back inside the house.  Since then, patient has been experiencing left shoulder pain.  There was an additional fall on 5/23 evening after getting up to use the restroom.  No head trauma or loss of consciousness noted.  No injuries noted from this fall.  Patient seen at her baseline mentation until this morning, when patient's son noticed that patient was having difficulty getting dressed.  She seemed confused by the steps needed to be taken.  At this time, Laura Carpenter denies any nausea, vomiting, diarrhea, chest pain, shortness of breath, dizziness, double vision or headache.  She states that her left shoulder does not hurt until she tries to move it.  ED course: On arrival to the ED, patient was hypertensive at 175/109 with heart rate of 83.  She was saturating at 95% on room air.  She was afebrile at 98.6.  Initial workup notable for normal CBC, and CMP with sodium of 128, glucose 124, creatinine 0.79 and GFR above 60.  Lactic acid  within normal limits at 1.1.  Urinalysis was obtained, however unclean catch.  CT of the head, C-spine and maxillofacial was obtained with evidence of a small 5 mm left subdural hematoma with no mass effect.  Neurosurgery was consulted with recommendations to repeat CT in 6 to 8 hours.  TRH contacted for admission.  Review of Systems: As mentioned in the history of present illness. All other systems reviewed and are negative.  Past Medical History:  Diagnosis Date   Actinic keratosis 05/08/2019   Breast cancer (HCC)    02/17/10: right lumpectomy reveals 0.8 cm of invasive ductal carcinoma with 1/3 LN+ for isolated tumor cell cluster in Oncotype recurrent score 19 or 12% chance of distant recurrence; Femara self-discontinued secondary to side effects. She continues to decline further therapy   Chronic hyponatremia    Hyponatremia for which she has had since somewhere in the mid 1990's. She's had multiple hospitalization, one hospitalization in 1993, she was diagnosed with an EF of 25% as well, though coronaries were clean during that hospitalization. The cath showed an EF of 50%. She is being worked up for her hyponatremia at [facility].   Chronic hyponatremia    Hyponatremia for which she has had since somewhere in the mid 1990's. She's had multiple hospitalization, one hospitalization in 1993, she was diagnosed with an EF of 25% as well, though coronaries were clean during that hospitalization. The cath showed an EF of 50%. She is being worked up for her hyponatremia at [facility].  Dehydration 06/29/2015   Dementia (HCC)    Dizziness    Dizziness 12/26/2011   Excessive cerumen in both ear canals 06/09/2020   Fall at home 09/24/2015   Fibromuscular dysplasia of renal artery [facility])    1997 Renal angioplasty at [facility]. She subsequently underwent repeat angioplasty a few years later. 12/2009 Abdominal Duplex: <60% bilateral RAS, Sequential narrowing and dilations of renal arteries, suggests fibromuscular  dysplasia.    H/O electrolyte imbalance    Hearing loss    Hemorrhoid    History of radiation therapy 2011   Hypertension    Hyponatremia 06/28/2018   Hypothyroidism    Ingrowing nail, left great toe    Ingrowing nail, left great toe 03/25/2014   Non-melanoma skin cancer    right hand   Otalgia of right ear 11/10/2011   Otalgia of right ear 11/10/2011   Polycythemia    Right ankle pain 10/28/2020   Senile dementia of Alzheimer's type (HCC) 06/29/2018   Established [facility] [place]   Skin cancer    squamous cell skin cancer removed by Dermatology--arms and hands   Skin lesion of left leg 05/29/2012   Sleep apnea    Uterine fibroid 06/28/2018   UTI (urinary tract infection)    Vertigo 12/26/2011   Past Surgical History:  Procedure Laterality Date   ANGIOPLASTY OF RIGHT RENAL ARTERY  05/04/1995   APPENDECTOMY     BREAST LUMPECTOMY Right 2012   performed [facility]    core biopsy breast Right 2012   ultrasound guided core needle biopsy of the right breast reveals invasive ductal carcinoma, ER/PR positive, HER2 neu    FRACTURE SURGERY  2010   right   Social History:  reports that she has quit smoking. Her smoking use included cigarettes. She has a 5.00 pack-year smoking history. She quit smokeless tobacco use about 46 years ago. She reports current alcohol use. She reports that she does not use drugs.  Allergies  Allergen Reactions   Aricept [Donepezil Hcl]     GI upset     Family History  Problem Relation Age of Onset   Hypertension Mother    Cancer Mother        ? type lived 32    Alcohol abuse Father        died 45    Cirrhosis Father    Hypertension Daughter        Laura Carpenter   Cirrhosis Brother        a lot of health problems died 50s-70s    Hyperlipidemia Son     Prior to Admission medications   Medication Sig Start Date End Date Taking? Authorizing Provider  amlodipine-benazepril (LOTREL) 2.5-10 MG capsule TAKE 1 CAPSULE BY MOUTH ONCE DAILY 05/10/22   Laura Allan, MD  aspirin 81 MG tablet Take 81 mg by mouth daily.    [provider]  atorvastatin (LIPITOR) 10 MG tablet TAKE ONE TABLET BY MOUTH EVERY DAY AT 6PM 01/05/22   Worthy Rancher B, FNP  Cholecalciferol (VITAMIN D3) 50 MCG (2000 UT) TABS Take 4,000 Units by mouth daily.    [provider]  levothyroxine (SYNTHROID) 125 MCG tablet Take 1 tablet (125 mcg total) by mouth daily before breakfast. 02/17/22   Laura Allan, MD  mirtazapine (REMERON) 7.5 MG tablet Take 7.5 mg by mouth at bedtime. Take 1 tablet daily 12/27/21   [provider]  rivastigmine (EXELON) 9.5 mg/24hr  04/24/18   [provider]  vitamin B-12 (CYANOCOBALAMIN) 1000  MCG tablet Take by mouth.    [provider]    Physical Exam: Vitals:   06/18/22 1442  BP: (!) 175/109  Pulse: 83  Resp: 18  Temp: 98.6 F (37 C)  TempSrc: Oral  SpO2: 95%  Weight: 54.4 kg  Height: 5\' 7"  (1.702 m)   Physical Exam Vitals and nursing note reviewed.  Constitutional:      General: She is not in acute distress.    Appearance: She is normal weight.  HENT:     Head: Normocephalic.     Comments: Abrasion noted above the left eyebrow but no active bleeding.  Underlying contusion.    Mouth/Throat:     Mouth: Mucous membranes are moist.     Pharynx: Oropharynx is clear.  Eyes:     Conjunctiva/sclera: Conjunctivae normal.     Pupils: Pupils are equal, round, and reactive to light.  Cardiovascular:     Rate and Rhythm: Normal rate and regular rhythm.     Heart sounds: No murmur heard. Pulmonary:     Effort: Pulmonary effort is normal. No respiratory distress.     Breath sounds: Normal breath sounds. No wheezing, rhonchi or rales.  Abdominal:     General: Bowel sounds are normal.     Palpations: Abdomen is soft.  Musculoskeletal:     Right shoulder: Normal. No swelling or deformity.     Left shoulder: No swelling, deformity or tenderness. Decreased range of motion (Difficulty noted with  abduction above 90 degree angle however examination is limited by patient's difficulty following commands).     Right lower leg: No edema.     Left lower leg: No edema.  Skin:    General: Skin is warm and dry.  Neurological:     Mental Status: She is alert.     Comments:  Patient is alert and oriented but difficulty with following some simple instructions, but much more notable on the multistep instructions.  No facial asymmetry or dysarthria 5 out of 5 strength throughout with the exception of left arm flexion, which is limited due to pain. No tremor  Psychiatric:        Mood and Affect: Mood normal.        Behavior: Behavior normal.    Data Reviewed: CBC with WBC of 5.2, hemoglobin 14.3, platelets of 184 CMP with sodium of 128, potassium 4.1, bicarb 26, glucose 124, BUN 19, creatinine 0.79, calcium 9.7, AST 56, ALT 43 and GFR above 60 Lactic acid within normal limits at 1.1 Urinalysis with 11-20 squamous epithelial cells, but small hematuria, ketones, moderate leukocytes and rare bacteria.  EKG personally reviewed.  Sinus rhythm with rate of 86.  Frequent PACs.  DG Shoulder Left  Result Date: 06/18/2022 CLINICAL DATA:  Post fall, now with left shoulder pain. EXAM: LEFT SHOULDER - 2+ VIEW COMPARISON:  None Available. FINDINGS: No fracture or dislocation. Severe degenerative change of the left glenohumeral joint with near complete joint space loss, subchondral sclerosis and osteophytosis. Suspected loose bodies are noted about the superior and inferior aspect of the glenohumeral joint space though discrete donor sites are not identified. Acromioclavicular joint spaces appear preserved. Crescentic calcification about the superior aspect of the left humeral head may represent the sequela of calcific tendinitis versus adhesive capsulitis. Limited visualization of the adjacent thorax demonstrates atherosclerotic plaque within the aortic arch. Regional soft tissues appear normal. IMPRESSION: 1.  No acute findings. 2. Severe degenerative change of the left glenohumeral joint with suspected loose bodies though discrete  donor sites are not identified. 3. Eccentric calcifications about the superior aspect of the left humeral head may represent the sequela of calcific tendinitis versus adhesive capsulitis. Electronically Signed   By: Simonne Come M.D.   On: 06/18/2022 16:49   CT HEAD WO CONTRAST ( )  Result Date: 06/18/2022 CLINICAL DATA:  Head trauma, minor (Age >= 65y); Facial trauma, blunt; Neck trauma (Age >= 65y). Fall. EXAM: CT HEAD WITHOUT CONTRAST CT MAXILLOFACIAL WITHOUT CONTRAST CT CERVICAL SPINE WITHOUT CONTRAST TECHNIQUE: Multidetector CT imaging of the head, cervical spine, and maxillofacial structures were performed using the standard protocol without intravenous contrast. Multiplanar CT image reconstructions of the cervical spine and maxillofacial structures were also generated. RADIATION DOSE REDUCTION: This exam was performed according to the departmental dose-optimization program which includes automated exposure control, adjustment of the mA and/or kV according to patient size and/or use of iterative reconstruction technique. COMPARISON:  Head MRI 12/05/2014 FINDINGS: CT HEAD FINDINGS Brain: A small acute subdural hematoma over the left cerebral convexity measures up to 5 mm in thickness without significant mass effect. No acute infarct, mass, or midline shift is identified. There is mild cerebral atrophy. Vascular: Calcified atherosclerosis at the skull base. Skull: No acute fracture or suspicious osseous lesion. Other: None. CT MAXILLOFACIAL FINDINGS Osseous: No acute fracture or mandibular dislocation. Bilateral TMJ arthropathy with condylar flattening. Orbits: Bilateral cataract extraction. Sinuses: Complete opacification of the right frontal sinus and right anterior and mid ethmoid air cells. Clear mastoid air cells. Soft tissues: Unremarkable. CT CERVICAL SPINE FINDINGS Alignment:  Reversal of the normal cervical lordosis. Grade 1 anterolisthesis of C3 on C4, C7 on T1, and T1 on T2. Trace retrolisthesis of C5 on C6. Mild left convex curvature of the cervical spine. Skull base and vertebrae: Moderately advanced median C1-2 arthropathy. No acute fracture or suspicious osseous lesion. Soft tissues and spinal canal: No prevertebral fluid or swelling. No visible canal hematoma. Disc levels: Diffuse disc degeneration, most advanced at C4-5. Advanced multilevel facet arthrosis. No evidence of high-grade spinal canal stenosis. Mild-to-moderate left neural foraminal stenosis at C5-6. Upper chest: Biapical pleuroparenchymal lung scarring. Other: Moderate atherosclerotic calcification at the carotid bifurcations. Critical Value/emergent results were called by telephone at the time of interpretation on 06/18/2022 at 4:20 pm to Dr. Cyril Loosen, who verbally acknowledged these results. IMPRESSION: 1. Small acute left-sided subdural hematoma without significant mass effect. 2. No acute maxillofacial or cervical spine fracture. Electronically Signed   By: Sebastian Ache M.D.   On: 06/18/2022 16:20   CT Cervical Spine Wo Contrast  Result Date: 06/18/2022 CLINICAL DATA:  Head trauma, minor (Age >= 65y); Facial trauma, blunt; Neck trauma (Age >= 65y). Fall. EXAM: CT HEAD WITHOUT CONTRAST CT MAXILLOFACIAL WITHOUT CONTRAST CT CERVICAL SPINE WITHOUT CONTRAST TECHNIQUE: Multidetector CT imaging of the head, cervical spine, and maxillofacial structures were performed using the standard protocol without intravenous contrast. Multiplanar CT image reconstructions of the cervical spine and maxillofacial structures were also generated. RADIATION DOSE REDUCTION: This exam was performed according to the departmental dose-optimization program which includes automated exposure control, adjustment of the mA and/or kV according to patient size and/or use of iterative reconstruction technique. COMPARISON:  Head MRI 12/05/2014  FINDINGS: CT HEAD FINDINGS Brain: A small acute subdural hematoma over the left cerebral convexity measures up to 5 mm in thickness without significant mass effect. No acute infarct, mass, or midline shift is identified. There is mild cerebral atrophy. Vascular: Calcified atherosclerosis at the skull base. Skull: No acute fracture or suspicious osseous lesion.  Other: None. CT MAXILLOFACIAL FINDINGS Osseous: No acute fracture or mandibular dislocation. Bilateral TMJ arthropathy with condylar flattening. Orbits: Bilateral cataract extraction. Sinuses: Complete opacification of the right frontal sinus and right anterior and mid ethmoid air cells. Clear mastoid air cells. Soft tissues: Unremarkable. CT CERVICAL SPINE FINDINGS Alignment: Reversal of the normal cervical lordosis. Grade 1 anterolisthesis of C3 on C4, C7 on T1, and T1 on T2. Trace retrolisthesis of C5 on C6. Mild left convex curvature of the cervical spine. Skull base and vertebrae: Moderately advanced median C1-2 arthropathy. No acute fracture or suspicious osseous lesion. Soft tissues and spinal canal: No prevertebral fluid or swelling. No visible canal hematoma. Disc levels: Diffuse disc degeneration, most advanced at C4-5. Advanced multilevel facet arthrosis. No evidence of high-grade spinal canal stenosis. Mild-to-moderate left neural foraminal stenosis at C5-6. Upper chest: Biapical pleuroparenchymal lung scarring. Other: Moderate atherosclerotic calcification at the carotid bifurcations. Critical Value/emergent results were called by telephone at the time of interpretation on 06/18/2022 at 4:20 pm to Dr. Cyril Loosen, who verbally acknowledged these results. IMPRESSION: 1. Small acute left-sided subdural hematoma without significant mass effect. 2. No acute maxillofacial or cervical spine fracture. Electronically Signed   By: Sebastian Ache M.D.   On: 06/18/2022 16:20   CT Maxillofacial Wo Contrast  Result Date: 06/18/2022 CLINICAL DATA:  Head trauma,  minor (Age >= 65y); Facial trauma, blunt; Neck trauma (Age >= 65y). Fall. EXAM: CT HEAD WITHOUT CONTRAST CT MAXILLOFACIAL WITHOUT CONTRAST CT CERVICAL SPINE WITHOUT CONTRAST TECHNIQUE: Multidetector CT imaging of the head, cervical spine, and maxillofacial structures were performed using the standard protocol without intravenous contrast. Multiplanar CT image reconstructions of the cervical spine and maxillofacial structures were also generated. RADIATION DOSE REDUCTION: This exam was performed according to the departmental dose-optimization program which includes automated exposure control, adjustment of the mA and/or kV according to patient size and/or use of iterative reconstruction technique. COMPARISON:  Head MRI 12/05/2014 FINDINGS: CT HEAD FINDINGS Brain: A small acute subdural hematoma over the left cerebral convexity measures up to 5 mm in thickness without significant mass effect. No acute infarct, mass, or midline shift is identified. There is mild cerebral atrophy. Vascular: Calcified atherosclerosis at the skull base. Skull: No acute fracture or suspicious osseous lesion. Other: None. CT MAXILLOFACIAL FINDINGS Osseous: No acute fracture or mandibular dislocation. Bilateral TMJ arthropathy with condylar flattening. Orbits: Bilateral cataract extraction. Sinuses: Complete opacification of the right frontal sinus and right anterior and mid ethmoid air cells. Clear mastoid air cells. Soft tissues: Unremarkable. CT CERVICAL SPINE FINDINGS Alignment: Reversal of the normal cervical lordosis. Grade 1 anterolisthesis of C3 on C4, C7 on T1, and T1 on T2. Trace retrolisthesis of C5 on C6. Mild left convex curvature of the cervical spine. Skull base and vertebrae: Moderately advanced median C1-2 arthropathy. No acute fracture or suspicious osseous lesion. Soft tissues and spinal canal: No prevertebral fluid or swelling. No visible canal hematoma. Disc levels: Diffuse disc degeneration, most advanced at C4-5.  Advanced multilevel facet arthrosis. No evidence of high-grade spinal canal stenosis. Mild-to-moderate left neural foraminal stenosis at C5-6. Upper chest: Biapical pleuroparenchymal lung scarring. Other: Moderate atherosclerotic calcification at the carotid bifurcations. Critical Value/emergent results were called by telephone at the time of interpretation on 06/18/2022 at 4:20 pm to Dr. Cyril Loosen, who verbally acknowledged these results. IMPRESSION: 1. Small acute left-sided subdural hematoma without significant mass effect. 2. No acute maxillofacial or cervical spine fracture. Electronically Signed   By: Sebastian Ache M.D.   On: 06/18/2022 16:20  Results are pending, will review when available.  Assessment and Plan:  * Subdural hematoma (HCC) Patient is presenting after 2 mechanical falls with the first occurring on 5/23 when she hit her head with no loss of consciousness.  CT head with small 5 mm left subdural hematoma with no mass effect.  - Neurosurgery consulted; appreciate their recommendations - Telemetry monitoring - Repeat CT head at approximately 22:00 - PT/OT  Altered mental status Patient's family noted altered mental status that began this morning that consisted of difficulty following commands and with simple tasks such as dressing.  Differential is broad and very likely multifactorial delirium in the setting of multiple falls, small subdural hematoma with possible concussion, and underlying dementia.  Hyponatremia is very mild and unlikely to be contributing to this picture.  No focal findings on exam to suggest CVA or TIA.  - Will obtain repeat urinalysis although patient denies any urinary symptoms - TSH and vitamin B12 - Delirium precautions  Hyponatremia Previous history of chronic hyponatremia that has resolved over the last few years.  Patient has a poor p.o. intake at baseline and evidence of ketones on urine.  I suspect this is hypovolemic in nature.  Could also be  secondary to subdural hematoma, however given very small size, less likely.  - IV fluids as ordered - Repeat BMP in the a.m.  Dementia (HCC) History of Alzheimer's dementia that has been stable.  - Continue home rivastigmine patch  Left shoulder pain Patient endorses left shoulder pain that has occurred since her fall 2 days ago.  On exam, notable difficulty with abduction above the 90 degree angle.  I am predominantly concerned for rotator cuff injury.  No evidence of fracture on imaging.  - Recommended outpatient follow-up with sports medicine - PT/OT  Hypertension Patient is hypertensive on exam, however she had not taken her medicines over the last 2 days.  She took her doses this morning.  I do not suspect current presentation is related to hypertension.  - Continue home medications  Hypothyroidism - Continue home Synthroid - TSH pending  Advance Care Planning:   Code Status: DNR/DNI.  Patient's daughter at bedside states patient's had a DO NOT RESUSCITATE form and has a hanging up on her fridge at home.  Patient initially was not certain about her CODE STATUS, but when discussed more in detail, she reiterated she would not want to be resuscitated.  Consults: Neurosurgery  Family Communication: Patient's daughter updated at bedside  Severity of Illness: The appropriate patient status for this patient is OBSERVATION. Observation status is judged to be reasonable and necessary in order to provide the required intensity of service to ensure the patient's safety. The patient's presenting symptoms, physical exam findings, and initial radiographic and laboratory data in the context of their medical condition is felt to place them at decreased risk for further clinical deterioration. Furthermore, it is anticipated that the patient will be medically stable for discharge from the hospital within 2 midnights of admission.   Author: Verdene Lennert, MD 06/18/2022 5:51 PM  For on call  review [email or web address].

## 2022-06-18 NOTE — Assessment & Plan Note (Addendum)
Previous history of chronic hyponatremia that has resolved over the last few years.  Patient has a poor p.o. intake at baseline and evidence of ketones on urine.  I suspect this is hypovolemic in nature.  Could also be secondary to subdural hematoma, however given very small size, less likely.  - IV fluids as ordered - Repeat BMP in the a.m.

## 2022-06-18 NOTE — Assessment & Plan Note (Signed)
-   Continue home Synthroid - TSH pending 

## 2022-06-18 NOTE — Progress Notes (Signed)
   06/18/22 2048  Assess: MEWS Score  BP (!) 160/122  MAP (mmHg) 136  Pulse Rate (!) 151  SpO2 95 %  Assess: MEWS Score  MEWS Temp 0  MEWS Systolic 0  MEWS Pulse 3  MEWS RR 2  MEWS LOC 0  MEWS Score 5  MEWS Score Color Red  Assess: if the MEWS score is Yellow or Red  Were vital signs taken at a resting state? Yes  Focused Assessment No change from prior assessment  Does the patient meet 2 or more of the SIRS criteria? Yes  Does the patient have a confirmed or suspected source of infection? No  MEWS guidelines implemented  No, previously red, continue vital signs every 4 hours  Assess: SIRS CRITERIA  SIRS Temperature  0  SIRS Pulse 1  SIRS Respirations  1  SIRS WBC 0  SIRS Score Sum  2

## 2022-06-18 NOTE — Progress Notes (Signed)
       CROSS COVER NOTE  NAME: Laura Carpenter MRN: [id number] DOB : January 13, 1936    HPI/Events of Note   Report: This pt was admitted at shift change with a subdural hematoma from a fall on Thursday. She is a red MEWS due to BP 159/118 HR 122 Resp 30 Temp 100.3. I had to put her on 2L O2 to get SATs up. She was in the upper 80's with no O2. She states she wears a Cpap at night at home. She has a hx of alzheimer's and no family is available to confirm  On review of chart:no prior report of fever.  Prior cardiac eveal last 1993 catheterization - EF 50%. No documented hx of arrhythmia    Assessment and  Interventions   Assessment: Neuro - Patient alert. PERRL, knows self and place, some confusion with situation (dementia baseline). MAE, denies pain   Pulm - Sats above 90 with oxygen at 2 L in place. No resp distress, Reports chronic cough and does take inhalers but does not know what (none found in outpatient pulmonary note) Course rhonchi LLL insp and exp  GU - purewick in place -High urine output -  over 1 L in one hour CV - a fib generalized non pitting edema, peripheral pulses palpable Plan: Acute Respiratory failure with hypoxia - titrate oxygen keep sats above 92%AP - Portable chest x-ray - reviewed by me bedside -  LLL effusion radiology reads "Patchy airspace disease in the left greater than right lung base suspicious for pneumonia." -  ceftriaxone and azithromycin initiated for CAP then discontinued as procal >0.10 - Resp Viral swab +metapneumovirus supportive care; - droplet/contact precautions  A FIB WITH RVR  -  read by arial fib RVR 151 bpm - labetalol 10 mg, if eneffective will bolus diltiazem and start drip - BNP  171.2. Echo ordered - transfer to progressive cardiac unit  Acute on chronic hyponatremia worstsening hyponatremia (acute on chronic) sodium decrease 126 from 128, potassium 3.4 -- start NS at 50 ml/h does of oral potassium - check urine electrolytes and osmo; serum osmo  -  consult nephrology in am if large volume urine output continues SDH - repeat - "Unchanged left convexity subdural hematoma.  New focus of subarachnoid hemorrhage within the right Sylvian fissure." - discussed with neurosurgeon DR Adriana Simas. No new management needs at this time  Family updated both on phone and at bedside  CRITICAL CARE Performed by: Manuela Schwartz   Total critical care time: 60 minutes  Critical care time was exclusive of separately billable procedures and treating other patients.  Critical care was necessary to treat or prevent imminent or life-threatening deterioration.  Critical care was time spent personally by me on the following activities: development of treatment plan with patient and/or surrogate as well as nursing, discussions with consultants, evaluation of patient's response to treatment, examination of patient, obtaining history from patient or surrogate, ordering and performing treatments and interventions, ordering and review of laboratory studies, ordering and review of radiographic studies, pulse oximetry and re-evaluation of patient's condition.          Donnie Mesa NP [facility] Cross Cover 7pm-7am - check amion for availability Pager [phone]

## 2022-06-18 NOTE — Progress Notes (Signed)
I was consulted about this patient with fall 2 days ago and now here with CT showing very small extra axial hematoma on left. Repeat scan shows stable blood but small focus of traumatic SAH on right also. There is no intervention for this and once patient is medically clear for home, recommend a repeat CT head for baseline. We can follow up as outpatient

## 2022-06-18 NOTE — Assessment & Plan Note (Signed)
History of Alzheimer's dementia that has been stable.  - Continue home rivastigmine patch

## 2022-06-18 NOTE — Assessment & Plan Note (Signed)
Patient is hypertensive on exam, however she had not taken her medicines over the last 2 days.  She took her doses this morning.  I do not suspect current presentation is related to hypertension.  - Continue home medications

## 2022-06-18 NOTE — Progress Notes (Signed)
   06/18/22 2002  Assess: MEWS Score  Temp 100.3 F (37.9 C)  BP (!) 159/118  MAP (mmHg) 130  Pulse Rate (!) 122  Resp (!) 30  SpO2 92 %  O2 Device Room Air  Assess: MEWS Score  MEWS Temp 0  MEWS Systolic 0  MEWS Pulse 2  MEWS RR 2  MEWS LOC 0  MEWS Score 4  MEWS Score Color Red  Assess: if the MEWS score is Yellow or Red  Were vital signs taken at a resting state? Yes  Focused Assessment Change from prior assessment (see assessment flowsheet)  Does the patient meet 2 or more of the SIRS criteria? Yes  Does the patient have a confirmed or suspected source of infection? No  MEWS guidelines implemented  Yes, red  Treat  MEWS Interventions Considered administering scheduled or prn medications/treatments as ordered  Take Vital Signs  Increase Vital Sign Frequency  Red: Q1hr x2, continue Q4hrs until patient remains green for 12hrs  Escalate  MEWS: Escalate Red: Discuss with charge nurse and notify provider. Consider notifying RRT. If remains red for 2 hours consider need for higher level of care  Notify: Charge Nurse/RN  Name of Charge Nurse/RN Notified [facility]  Provider Notification  Provider Name/Title Manuela Schwartz, NP  Date Provider Notified 06/18/22  Time Provider Notified 2008  Method of Notification  (secure chat)  Notification Reason Change in status (Red MEWS)  Provider response En route  Date of Provider Response 06/18/22 (2010)  Time of Provider Response 2010  Assess: SIRS CRITERIA  SIRS Temperature  0  SIRS Pulse 1  SIRS Respirations  1  SIRS WBC 0  SIRS Score Sum  2   Brenda to floor to evaluate pt. Orders received and implemented. Pt to transfer to PCU when bed available. Will continue to monitor

## 2022-06-18 NOTE — Progress Notes (Signed)
   06/18/22 2153  Vitals  Temp 99.1 F (37.3 C)  Temp Source Oral  BP 102/68  MAP (mmHg) 79  BP Location Right Arm  BP Method Automatic  Patient Position (if appropriate) Lying  Pulse Rate (!) 120  Pulse Rate Source Monitor  Resp (!) 22  MEWS COLOR  MEWS Score Color Yellow  Oxygen Therapy  SpO2 95 %  O2 Device Nasal Cannula  O2 Flow Rate (L/min) 2 L/min  MEWS Score  MEWS Temp 0  MEWS Systolic 0  MEWS Pulse 2  MEWS RR 1  MEWS LOC 0  MEWS Score 3

## 2022-06-18 NOTE — Assessment & Plan Note (Signed)
Patient's family noted altered mental status that began this morning that consisted of difficulty following commands and with simple tasks such as dressing.  Differential is broad and very likely multifactorial delirium in the setting of multiple falls, small subdural hematoma with possible concussion, and underlying dementia.  Hyponatremia is very mild and unlikely to be contributing to this picture.  No focal findings on exam to suggest CVA or TIA.  - Will obtain repeat urinalysis although patient denies any urinary symptoms - TSH and vitamin B12 - Delirium precautions

## 2022-06-18 NOTE — ED Triage Notes (Signed)
First Nurse Note:  Arrives with family who report patient had a fall on Thursday.

## 2022-06-18 NOTE — ED Provider Notes (Addendum)
[facility] Emergency Department Provider Note     Event Date/Time   First MD Initiated Contact with Patient 06/18/22 1453     (approximate)   History   Fall and Arm Injury  HPI  Laura Carpenter is a 87 y.o. female history of Alzheimer's dementia, hypothyroidism, HTN, chronic hyponatremia presents to the ED accompanied by her adult daughter.  Patient apparently had a witnessed mechanical fall 2 days ago while walking in the driveway with her husband.  She apparently fell forward hitting her face.  No reports of LOC.  The husband was able to get the patient to her feet and back into the home.  There was also reports of a second fall on Thursday night, in the bathroom without evidence or reports of LOC.  Since that time she has had disability to the left shoulder.  She was also found to not have taken her home meds for the last 2 days.  According to the adult son who spent the night with the parents last evening, he notes some increased confusion in the patient.  Patient was self who is hard of hearing, only endorses minimal pain to the left arm and shoulder.    Physical Exam   Triage Vital Signs: ED Triage Vitals  Enc Vitals Group     BP 06/18/22 1442 (!) 175/109     Pulse Rate 06/18/22 1442 83     Resp 06/18/22 1442 18     Temp 06/18/22 1442 98.6 F (37 C)     Temp Source 06/18/22 1442 Oral     SpO2 06/18/22 1442 95 %     Weight 06/18/22 1442 120 lb (54.4 kg)     Height 06/18/22 1442 5\' 7"  (1.702 m)     Head Circumference --      Peak Flow --      Pain Score 06/18/22 1446 0     Pain Loc --      Pain Edu? --      Excl. in GC? --     Most recent vital signs: Vitals:   06/18/22 1442  BP: (!) 175/109  Pulse: 83  Resp: 18  Temp: 98.6 F (37 C)  SpO2: 95%    General Awake, no distress. NAD HEENT NCAT, except for some mild ecchymosis and bruising to the left lateral periorbital region. PERRL. EOMI. No rhinorrhea. Mucous membranes are moist.    CV:  Good peripheral perfusion.  RESP:  Normal effort.  ABD:  No distention.  MSK:  Normal spinal alignment without midline tenderness, spasm, deformity, or step-off.  Normal composite fist bilaterally patient did not demonstrate normal forearm supination and pronation on the left. Limited left shoulder extension noted; patient uses the right arm to help extend the left upper arm.  Normal rotation internally and externally, with some subjective complaints of pain.   ED Results / Procedures / Treatments   Labs (all labs ordered are listed, but only abnormal results are displayed) Labs Reviewed  CBC WITH DIFFERENTIAL/PLATELET - Abnormal; Notable for the following components:      Result Value   Lymphs Abs 0.5 (*)    All other components within normal limits  URINALYSIS, ROUTINE W REFLEX MICROSCOPIC - Abnormal; Notable for the following components:   Color, Urine YELLOW (*)    APPearance CLOUDY (*)    Hgb urine dipstick SMALL (*)    Ketones, ur 5 (*)    Protein, ur 30 (*)    Leukocytes,Ua  MODERATE (*)    Bacteria, UA RARE (*)    All other components within normal limits  COMPREHENSIVE METABOLIC PANEL - Abnormal; Notable for the following components:   Sodium 128 (*)    Chloride 94 (*)    Glucose, Bld 124 (*)    AST 56 (*)    All other components within normal limits  LACTIC ACID, PLASMA  LACTIC ACID, PLASMA    EKG  Vent. rate 86 BPM PR interval 124 ms QRS duration 82 ms QT/QTcB 364/435 ms P-R-T axes 42 -83 60 Sinus rhythm with PVCs Left axis deviation No STEMI   RADIOLOGY  I personally viewed and evaluated these images as part of my medical decision making, as well as reviewing the written report by the radiologist.  ED Provider Interpretation: 5 mm SDH noted to left renal cortex.  No other acute CT findings of the cervical spine or max face.  No acute findings to the left shoulder.  DG Shoulder Left  Result Date: 06/18/2022 CLINICAL DATA:  Post fall, now with  left shoulder pain. EXAM: LEFT SHOULDER - 2+ VIEW COMPARISON:  None Available. FINDINGS: No fracture or dislocation. Severe degenerative change of the left glenohumeral joint with near complete joint space loss, subchondral sclerosis and osteophytosis. Suspected loose bodies are noted about the superior and inferior aspect of the glenohumeral joint space though discrete donor sites are not identified. Acromioclavicular joint spaces appear preserved. Crescentic calcification about the superior aspect of the left humeral head may represent the sequela of calcific tendinitis versus adhesive capsulitis. Limited visualization of the adjacent thorax demonstrates atherosclerotic plaque within the aortic arch. Regional soft tissues appear normal. IMPRESSION: 1. No acute findings. 2. Severe degenerative change of the left glenohumeral joint with suspected loose bodies though discrete donor sites are not identified. 3. Eccentric calcifications about the superior aspect of the left humeral head may represent the sequela of calcific tendinitis versus adhesive capsulitis. Electronically Signed   By: Simonne Come M.D.   On: 06/18/2022 16:49   CT HEAD WO CONTRAST ( )  Result Date: 06/18/2022 CLINICAL DATA:  Head trauma, minor (Age >= 65y); Facial trauma, blunt; Neck trauma (Age >= 65y). Fall. EXAM: CT HEAD WITHOUT CONTRAST CT MAXILLOFACIAL WITHOUT CONTRAST CT CERVICAL SPINE WITHOUT CONTRAST TECHNIQUE: Multidetector CT imaging of the head, cervical spine, and maxillofacial structures were performed using the standard protocol without intravenous contrast. Multiplanar CT image reconstructions of the cervical spine and maxillofacial structures were also generated. RADIATION DOSE REDUCTION: This exam was performed according to the departmental dose-optimization program which includes automated exposure control, adjustment of the mA and/or kV according to patient size and/or use of iterative reconstruction technique. COMPARISON:   Head MRI 12/05/2014 FINDINGS: CT HEAD FINDINGS Brain: A small acute subdural hematoma over the left cerebral convexity measures up to 5 mm in thickness without significant mass effect. No acute infarct, mass, or midline shift is identified. There is mild cerebral atrophy. Vascular: Calcified atherosclerosis at the skull base. Skull: No acute fracture or suspicious osseous lesion. Other: None. CT MAXILLOFACIAL FINDINGS Osseous: No acute fracture or mandibular dislocation. Bilateral TMJ arthropathy with condylar flattening. Orbits: Bilateral cataract extraction. Sinuses: Complete opacification of the right frontal sinus and right anterior and mid ethmoid air cells. Clear mastoid air cells. Soft tissues: Unremarkable. CT CERVICAL SPINE FINDINGS Alignment: Reversal of the normal cervical lordosis. Grade 1 anterolisthesis of C3 on C4, C7 on T1, and T1 on T2. Trace retrolisthesis of C5 on C6. Mild left convex curvature of the  cervical spine. Skull base and vertebrae: Moderately advanced median C1-2 arthropathy. No acute fracture or suspicious osseous lesion. Soft tissues and spinal canal: No prevertebral fluid or swelling. No visible canal hematoma. Disc levels: Diffuse disc degeneration, most advanced at C4-5. Advanced multilevel facet arthrosis. No evidence of high-grade spinal canal stenosis. Mild-to-moderate left neural foraminal stenosis at C5-6. Upper chest: Biapical pleuroparenchymal lung scarring. Other: Moderate atherosclerotic calcification at the carotid bifurcations. Critical Value/emergent results were called by telephone at the time of interpretation on 06/18/2022 at 4:20 pm to Dr. Cyril Loosen, who verbally acknowledged these results. IMPRESSION: 1. Small acute left-sided subdural hematoma without significant mass effect. 2. No acute maxillofacial or cervical spine fracture. Electronically Signed   By: Sebastian Ache M.D.   On: 06/18/2022 16:20   CT Cervical Spine Wo Contrast  Result Date: 06/18/2022 CLINICAL  DATA:  Head trauma, minor (Age >= 65y); Facial trauma, blunt; Neck trauma (Age >= 65y). Fall. EXAM: CT HEAD WITHOUT CONTRAST CT MAXILLOFACIAL WITHOUT CONTRAST CT CERVICAL SPINE WITHOUT CONTRAST TECHNIQUE: Multidetector CT imaging of the head, cervical spine, and maxillofacial structures were performed using the standard protocol without intravenous contrast. Multiplanar CT image reconstructions of the cervical spine and maxillofacial structures were also generated. RADIATION DOSE REDUCTION: This exam was performed according to the departmental dose-optimization program which includes automated exposure control, adjustment of the mA and/or kV according to patient size and/or use of iterative reconstruction technique. COMPARISON:  Head MRI 12/05/2014 FINDINGS: CT HEAD FINDINGS Brain: A small acute subdural hematoma over the left cerebral convexity measures up to 5 mm in thickness without significant mass effect. No acute infarct, mass, or midline shift is identified. There is mild cerebral atrophy. Vascular: Calcified atherosclerosis at the skull base. Skull: No acute fracture or suspicious osseous lesion. Other: None. CT MAXILLOFACIAL FINDINGS Osseous: No acute fracture or mandibular dislocation. Bilateral TMJ arthropathy with condylar flattening. Orbits: Bilateral cataract extraction. Sinuses: Complete opacification of the right frontal sinus and right anterior and mid ethmoid air cells. Clear mastoid air cells. Soft tissues: Unremarkable. CT CERVICAL SPINE FINDINGS Alignment: Reversal of the normal cervical lordosis. Grade 1 anterolisthesis of C3 on C4, C7 on T1, and T1 on T2. Trace retrolisthesis of C5 on C6. Mild left convex curvature of the cervical spine. Skull base and vertebrae: Moderately advanced median C1-2 arthropathy. No acute fracture or suspicious osseous lesion. Soft tissues and spinal canal: No prevertebral fluid or swelling. No visible canal hematoma. Disc levels: Diffuse disc degeneration, most  advanced at C4-5. Advanced multilevel facet arthrosis. No evidence of high-grade spinal canal stenosis. Mild-to-moderate left neural foraminal stenosis at C5-6. Upper chest: Biapical pleuroparenchymal lung scarring. Other: Moderate atherosclerotic calcification at the carotid bifurcations. Critical Value/emergent results were called by telephone at the time of interpretation on 06/18/2022 at 4:20 pm to Dr. Cyril Loosen, who verbally acknowledged these results. IMPRESSION: 1. Small acute left-sided subdural hematoma without significant mass effect. 2. No acute maxillofacial or cervical spine fracture. Electronically Signed   By: Sebastian Ache M.D.   On: 06/18/2022 16:20   CT Maxillofacial Wo Contrast  Result Date: 06/18/2022 CLINICAL DATA:  Head trauma, minor (Age >= 65y); Facial trauma, blunt; Neck trauma (Age >= 65y). Fall. EXAM: CT HEAD WITHOUT CONTRAST CT MAXILLOFACIAL WITHOUT CONTRAST CT CERVICAL SPINE WITHOUT CONTRAST TECHNIQUE: Multidetector CT imaging of the head, cervical spine, and maxillofacial structures were performed using the standard protocol without intravenous contrast. Multiplanar CT image reconstructions of the cervical spine and maxillofacial structures were also generated. RADIATION DOSE REDUCTION: This exam  was performed according to the departmental dose-optimization program which includes automated exposure control, adjustment of the mA and/or kV according to patient size and/or use of iterative reconstruction technique. COMPARISON:  Head MRI 12/05/2014 FINDINGS: CT HEAD FINDINGS Brain: A small acute subdural hematoma over the left cerebral convexity measures up to 5 mm in thickness without significant mass effect. No acute infarct, mass, or midline shift is identified. There is mild cerebral atrophy. Vascular: Calcified atherosclerosis at the skull base. Skull: No acute fracture or suspicious osseous lesion. Other: None. CT MAXILLOFACIAL FINDINGS Osseous: No acute fracture or mandibular  dislocation. Bilateral TMJ arthropathy with condylar flattening. Orbits: Bilateral cataract extraction. Sinuses: Complete opacification of the right frontal sinus and right anterior and mid ethmoid air cells. Clear mastoid air cells. Soft tissues: Unremarkable. CT CERVICAL SPINE FINDINGS Alignment: Reversal of the normal cervical lordosis. Grade 1 anterolisthesis of C3 on C4, C7 on T1, and T1 on T2. Trace retrolisthesis of C5 on C6. Mild left convex curvature of the cervical spine. Skull base and vertebrae: Moderately advanced median C1-2 arthropathy. No acute fracture or suspicious osseous lesion. Soft tissues and spinal canal: No prevertebral fluid or swelling. No visible canal hematoma. Disc levels: Diffuse disc degeneration, most advanced at C4-5. Advanced multilevel facet arthrosis. No evidence of high-grade spinal canal stenosis. Mild-to-moderate left neural foraminal stenosis at C5-6. Upper chest: Biapical pleuroparenchymal lung scarring. Other: Moderate atherosclerotic calcification at the carotid bifurcations. Critical Value/emergent results were called by telephone at the time of interpretation on 06/18/2022 at 4:20 pm to Dr. Cyril Loosen, who verbally acknowledged these results. IMPRESSION: 1. Small acute left-sided subdural hematoma without significant mass effect. 2. No acute maxillofacial or cervical spine fracture. Electronically Signed   By: Sebastian Ache M.D.   On: 06/18/2022 16:20     PROCEDURES:  Critical Care performed: Yes  CRITICAL CARE Performed by: Lissa Hoard   Total critical care time: 30 minutes  Critical care time was exclusive of separately billable procedures and treating other patients.  Critical care was necessary to treat or prevent imminent or life-threatening deterioration.  Critical care was time spent personally by me on the following activities: development of treatment plan with patient and/or surrogate as well as nursing, discussions with consultants,  evaluation of patient's response to treatment, examination of patient, obtaining history from patient or surrogate, ordering and performing treatments and interventions, ordering and review of laboratory studies, ordering and review of radiographic studies, pulse oximetry and re-evaluation of patient's condition.   Procedures  MEDICATIONS ORDERED IN ED: Medications  sodium chloride 0.9 % bolus 1,000 mL (has no administration in time range)     IMPRESSION / MDM / ASSESSMENT AND PLAN / ED COURSE  I reviewed the triage vital signs and the nursing notes.                              Differential diagnosis includes, but is not limited to, alcohol, illicit or prescription medications, or other toxic ingestion; intracranial pathology such as stroke or intracerebral hemorrhage; fever or infectious causes including sepsis; hypoxemia and/or hypercarbia; uremia; trauma; endocrine related disorders such as diabetes, hypoglycemia, and thyroid-related diseases; hypertensive encephalopathy; closed head injury, facial fracture, cervical spine fracture, shoulder fracture, shoulder dislocation  Patient's presentation is most consistent with acute presentation with potential threat to life or bodily function.   ----------------------------------------- 4:31 PM on 06/18/2022 ----------------------------------------- Spoke with Dr. Adriana Simas (neurosx): From a neurosurgery standpoint, based on CT scan  results, he would recommend repeating the CT scan in 6 to 8 hours, and patient will otherwise be stable for outpatient management.   ----------------------------------------- 4:59 PM on 06/18/2022 ----------------------------------------- S/W Dr. Olene Floss: she will admit the patient to the hospitalist service for further management and repeat imaging as recommended.   Patient's diagnosis is consistent with mechanical fall resulting in small SDH without mass effect, as confirmed on CT imaging.  No other CT  abnormalities noted.  Plain films of the left shoulder reveal significant osteoarthritis, bone spur formation, and evidence of possible loose bodies patient otherwise noted to be hyponatremic on exam with sodium of 128.  No leukocytosis or lactic acidosis at this time.  UA shows rare bacteria.  I recommend admission to the hospital service as the patient is increased AMS likely secondary to her acute hyponatremia. Patient and her adult daughter were made aware of the diagnosis, need for repeat CT imaging, and acute hyponatremia.  Patient will be admitted to the hospital service for further medical management and repeat imaging of her stable SDH.   FINAL CLINICAL IMPRESSION(S) / ED DIAGNOSES   Final diagnoses:  Hyponatremia  SDH (subdural hematoma) (HCC)  Fall in home, initial encounter     Rx / DC Orders   ED Discharge Orders     None        Note:  This document was prepared using Dragon voice recognition software and may include unintentional dictation errors.    Lissa Hoard, PA-C 06/18/22 1712    Jene Every, MD 06/18/22 1713    Lissa Hoard, PA-C 06/18/22 1715    Jene Every, MD 06/18/22 [phone]

## 2022-06-18 NOTE — Assessment & Plan Note (Signed)
Patient endorses left shoulder pain that has occurred since her fall 2 days ago.  On exam, notable difficulty with abduction above the 90 degree angle.  I am predominantly concerned for rotator cuff injury.  No evidence of fracture on imaging.  - Recommended outpatient follow-up with sports medicine - PT/OT

## 2022-06-18 NOTE — ED Triage Notes (Signed)
Pt to ED with daughter POV after a fall (unknown how she fell) on Thursday. Pt cannot lift L arm on its own without first lifting it up using R arm. Pt is moving hand and fingers. Pt denies pain just stats cannot lift arm. Pt is alert, oriented, with unlabored respirations. Daughter states she may have also fallen Thursday night. Hx dementia.

## 2022-06-18 NOTE — Assessment & Plan Note (Signed)
Patient is presenting after 2 mechanical falls with the first occurring on 5/23 when she hit her head with no loss of consciousness.  CT head with small 5 mm left subdural hematoma with no mass effect.  - Neurosurgery consulted; appreciate their recommendations - Telemetry monitoring - Repeat CT head at approximately 22:00 - PT/OT

## 2022-06-19 ENCOUNTER — Observation Stay
Admit: 2022-06-19 | Discharge: 2022-06-19 | Disposition: A | Discharge status: Home / Self Care | Payer: Medicare Other | Attending: Acute Care | Admitting: Acute Care

## 2022-06-19 ENCOUNTER — Observation Stay (HOSPITAL_BASED_OUTPATIENT_CLINIC_OR_DEPARTMENT_OTHER)
Admit: 2022-06-19 | Discharge: 2022-06-19 | Disposition: A | Discharge status: Home / Self Care | Payer: Medicare Other | Attending: Acute Care | Admitting: Acute Care

## 2022-06-19 DIAGNOSIS — S065XAA Traumatic subdural hemorrhage with loss of consciousness status unknown, initial encounter: Secondary | ICD-10-CM

## 2022-06-19 DIAGNOSIS — I4891 Unspecified atrial fibrillation: Secondary | ICD-10-CM

## 2022-06-19 LAB — RESPIRATORY PANEL BY PCR

## 2022-06-19 LAB — ECHOCARDIOGRAM COMPLETE
Height: 67 in
S' Lateral: 2.8 cm
Weight: 1920 oz

## 2022-06-19 LAB — CBC
HCT: 37.8 % (ref 36.0–46.0)
Hemoglobin: 13 g/dL (ref 12.0–15.0)
MCH: 32.2 pg (ref 26.0–34.0)
MCHC: 34.4 g/dL (ref 30.0–36.0)
MCV: 93.6 fL (ref 80.0–100.0)
Platelets: 166 10*3/uL (ref 150–400)
RBC: 4.04 MIL/uL (ref 3.87–5.11)
RDW: 12.9 % (ref 11.5–15.5)
WBC: 5.4 10*3/uL (ref 4.0–10.5)
nRBC: 0 % (ref 0.0–0.2)

## 2022-06-19 LAB — BASIC METABOLIC PANEL
Anion gap: 8 (ref 5–15)
BUN: 18 mg/dL (ref 8–23)
CO2: 25 mmol/L (ref 22–32)
Calcium: 8.6 mg/dL — ABNORMAL LOW (ref 8.9–10.3)
Chloride: 95 mmol/L — ABNORMAL LOW (ref 98–111)
Creatinine, Ser: 0.79 mg/dL (ref 0.44–1.00)
GFR, Estimated: >60 mL/min (ref >60)
Glucose, Bld: 114 mg/dL — ABNORMAL HIGH (ref 70–99)
Potassium: 4.7 mmol/L (ref 3.5–5.1)
Sodium: 128 mmol/L — ABNORMAL LOW (ref 135–145)

## 2022-06-19 LAB — STREP PNEUMONIAE URINARY ANTIGEN: Strep Pneumo Urinary Antigen: NEGATIVE

## 2022-06-19 MED ORDER — ENSURE ENLIVE PO LIQD
237.0000 mL | Freq: Two times a day (BID) | ORAL | Status: DC
  Administered 2022-06-20 – 2022-06-23 (×7): 237 mL via ORAL

## 2022-06-19 MED ORDER — AZITHROMYCIN 250 MG PO TABS
500.0000 mg | ORAL_TABLET | Freq: Every day | ORAL | Status: DC
  Administered 2022-06-19: 500 mg via ORAL
  Filled 2022-06-19: qty 2

## 2022-06-19 NOTE — Progress Notes (Signed)
  Echocardiogram 2D Echocardiogram has NOT been performed. The patient was working with PT when I arrived and the patient wanted breakfast, asked I come back this afternoon she is exhausted.  Lenor Coffin 06/19/2022, 9:47 AM

## 2022-06-19 NOTE — Progress Notes (Signed)
[facility]  - [place] [facility]   PATIENT NAME: Laura Carpenter    MR#:  [id number]  DATE OF BIRTH:  February 11, 1935  SUBJECTIVE:  husband and daughter at bedside. Patient is a bit hard on hearing although was able to hear better with hearing aids. Came in after fall at home found to have subdural hematoma and subarachnoid hemorrhage mild. Mentation stable. Has baseline mild to moderate dementia. Work with PT OT earlier. Has some chronic congested cough. No fever.    VITALS:  Blood pressure (!) 151/92, pulse 65, temperature 98 F (36.7 C), temperature source Oral, resp. rate (!) 23, height 5\' 7"  (1.702 m), weight 54.4 kg, SpO2 98 %.  PHYSICAL EXAMINATION:   GENERAL:  87 y.o.-year-old patient with no acute distress. thin LUNGS: Coarse breath sounds bilaterally, no wheezing CARDIOVASCULAR: S1, S2 normal. No murmur   ABDOMEN: Soft, nontender, nondistended. Bowel sounds present.  EXTREMITIES: No  edema b/l.    NEUROLOGIC: nonfocal  patient is alert and awake SKIN: No obvious rash, lesion, or ulcer.   LABORATORY PANEL:  CBC Recent Labs  Lab 06/19/22 0451  WBC 5.4  HGB 13.0  HCT 37.8  PLT 166    Chemistries  Recent Labs  Lab 06/18/22 1528 06/18/22 2047 06/19/22 0451  NA 128* 126* 128*  K 4.1 3.4* 4.7  CL 94* 89* 95*  CO2 26 25 25   GLUCOSE 124* 130* 114*  BUN 19 14 18   CREATININE 0.79 0.63 0.79  CALCIUM 9.7 9.3 8.6*  MG  --  1.9  --   AST 56*  --   --   ALT 43  --   --   ALKPHOS 92  --   --   BILITOT 0.7  --   --    Cardiac Enzymes No results for input(s): "TROPONINI" in the last 168 hours. RADIOLOGY:  CT HEAD WO CONTRAST ( )  Result Date: 06/18/2022 CLINICAL DATA:  Subdural hematoma EXAM: CT HEAD WITHOUT CONTRAST TECHNIQUE: Contiguous axial images were obtained from the base of the skull through the vertex without intravenous contrast. RADIATION DOSE REDUCTION: This exam was performed according to the departmental dose-optimization program which  includes automated exposure control, adjustment of the mA and/or kV according to patient size and/or use of iterative reconstruction technique. COMPARISON:  06/18/2022 at 3:55 p.m. FINDINGS: Brain: Posterior left convexity subdural hematoma is unchanged measuring 6 mm. There is a new focus of subarachnoid hemorrhage measuring approximately 6 mm within the right sylvian fissure. No midline shift or other mass effect. Vascular: Calcific atherosclerosis of the carotid and vertebral arteries at skull base. Skull: Negative Sinuses/Orbits: Opacification of the right frontal and anterior ethmoid sinuses. The orbits are normal. Other: None IMPRESSION: 1. Unchanged left convexity subdural hematoma. 2. New focus of subarachnoid hemorrhage within the right Sylvian fissure. Electronically Signed   By: Deatra Robinson M.D.   On: 06/18/2022 22:51   DG Chest Port 1 View  Result Date: 06/18/2022 CLINICAL DATA:  Hypoxia. Tachycardia. EXAM: PORTABLE CHEST 1 VIEW COMPARISON:  Remote chest radiograph 02/12/2006. Lung bases from abdominal CT 04/13/2022 FINDINGS: There is patchy airspace disease in the left greater than right lung base. The heart is normal in size. Aortic atherosclerosis. Biapical pleuroparenchymal scarring. No pleural fluid, pneumothorax, or pulmonary edema. Right chest wall/axillary surgical clips. Left shoulder arthropathy. IMPRESSION: Patchy airspace disease in the left greater than right lung base, suspicious for pneumonia. Electronically Signed   By: Narda Rutherford M.D.   On: 06/18/2022 21:06  DG Shoulder Left  Result Date: 06/18/2022 CLINICAL DATA:  Post fall, now with left shoulder pain. EXAM: LEFT SHOULDER - 2+ VIEW COMPARISON:  None Available. FINDINGS: No fracture or dislocation. Severe degenerative change of the left glenohumeral joint with near complete joint space loss, subchondral sclerosis and osteophytosis. Suspected loose bodies are noted about the superior and inferior aspect of the  glenohumeral joint space though discrete donor sites are not identified. Acromioclavicular joint spaces appear preserved. Crescentic calcification about the superior aspect of the left humeral head may represent the sequela of calcific tendinitis versus adhesive capsulitis. Limited visualization of the adjacent thorax demonstrates atherosclerotic plaque within the aortic arch. Regional soft tissues appear normal. IMPRESSION: 1. No acute findings. 2. Severe degenerative change of the left glenohumeral joint with suspected loose bodies though discrete donor sites are not identified. 3. Eccentric calcifications about the superior aspect of the left humeral head may represent the sequela of calcific tendinitis versus adhesive capsulitis. Electronically Signed   By: Simonne Come M.D.   On: 06/18/2022 16:49   CT HEAD WO CONTRAST ( )  Result Date: 06/18/2022 CLINICAL DATA:  Head trauma, minor (Age >= 65y); Facial trauma, blunt; Neck trauma (Age >= 65y). Fall. EXAM: CT HEAD WITHOUT CONTRAST CT MAXILLOFACIAL WITHOUT CONTRAST CT CERVICAL SPINE WITHOUT CONTRAST TECHNIQUE: Multidetector CT imaging of the head, cervical spine, and maxillofacial structures were performed using the standard protocol without intravenous contrast. Multiplanar CT image reconstructions of the cervical spine and maxillofacial structures were also generated. RADIATION DOSE REDUCTION: This exam was performed according to the departmental dose-optimization program which includes automated exposure control, adjustment of the mA and/or kV according to patient size and/or use of iterative reconstruction technique. COMPARISON:  Head MRI 12/05/2014 FINDINGS: CT HEAD FINDINGS Brain: A small acute subdural hematoma over the left cerebral convexity measures up to 5 mm in thickness without significant mass effect. No acute infarct, mass, or midline shift is identified. There is mild cerebral atrophy. Vascular: Calcified atherosclerosis at the skull base.  Skull: No acute fracture or suspicious osseous lesion. Other: None. CT MAXILLOFACIAL FINDINGS Osseous: No acute fracture or mandibular dislocation. Bilateral TMJ arthropathy with condylar flattening. Orbits: Bilateral cataract extraction. Sinuses: Complete opacification of the right frontal sinus and right anterior and mid ethmoid air cells. Clear mastoid air cells. Soft tissues: Unremarkable. CT CERVICAL SPINE FINDINGS Alignment: Reversal of the normal cervical lordosis. Grade 1 anterolisthesis of C3 on C4, C7 on T1, and T1 on T2. Trace retrolisthesis of C5 on C6. Mild left convex curvature of the cervical spine. Skull base and vertebrae: Moderately advanced median C1-2 arthropathy. No acute fracture or suspicious osseous lesion. Soft tissues and spinal canal: No prevertebral fluid or swelling. No visible canal hematoma. Disc levels: Diffuse disc degeneration, most advanced at C4-5. Advanced multilevel facet arthrosis. No evidence of high-grade spinal canal stenosis. Mild-to-moderate left neural foraminal stenosis at C5-6. Upper chest: Biapical pleuroparenchymal lung scarring. Other: Moderate atherosclerotic calcification at the carotid bifurcations. Critical Value/emergent results were called by telephone at the time of interpretation on 06/18/2022 at 4:20 pm to Dr. Cyril Loosen, who verbally acknowledged these results. IMPRESSION: 1. Small acute left-sided subdural hematoma without significant mass effect. 2. No acute maxillofacial or cervical spine fracture. Electronically Signed   By: Sebastian Ache M.D.   On: 06/18/2022 16:20   CT Cervical Spine Wo Contrast  Result Date: 06/18/2022 CLINICAL DATA:  Head trauma, minor (Age >= 65y); Facial trauma, blunt; Neck trauma (Age >= 65y). Fall. EXAM: CT HEAD WITHOUT CONTRAST CT  MAXILLOFACIAL WITHOUT CONTRAST CT CERVICAL SPINE WITHOUT CONTRAST TECHNIQUE: Multidetector CT imaging of the head, cervical spine, and maxillofacial structures were performed using the standard protocol  without intravenous contrast. Multiplanar CT image reconstructions of the cervical spine and maxillofacial structures were also generated. RADIATION DOSE REDUCTION: This exam was performed according to the departmental dose-optimization program which includes automated exposure control, adjustment of the mA and/or kV according to patient size and/or use of iterative reconstruction technique. COMPARISON:  Head MRI 12/05/2014 FINDINGS: CT HEAD FINDINGS Brain: A small acute subdural hematoma over the left cerebral convexity measures up to 5 mm in thickness without significant mass effect. No acute infarct, mass, or midline shift is identified. There is mild cerebral atrophy. Vascular: Calcified atherosclerosis at the skull base. Skull: No acute fracture or suspicious osseous lesion. Other: None. CT MAXILLOFACIAL FINDINGS Osseous: No acute fracture or mandibular dislocation. Bilateral TMJ arthropathy with condylar flattening. Orbits: Bilateral cataract extraction. Sinuses: Complete opacification of the right frontal sinus and right anterior and mid ethmoid air cells. Clear mastoid air cells. Soft tissues: Unremarkable. CT CERVICAL SPINE FINDINGS Alignment: Reversal of the normal cervical lordosis. Grade 1 anterolisthesis of C3 on C4, C7 on T1, and T1 on T2. Trace retrolisthesis of C5 on C6. Mild left convex curvature of the cervical spine. Skull base and vertebrae: Moderately advanced median C1-2 arthropathy. No acute fracture or suspicious osseous lesion. Soft tissues and spinal canal: No prevertebral fluid or swelling. No visible canal hematoma. Disc levels: Diffuse disc degeneration, most advanced at C4-5. Advanced multilevel facet arthrosis. No evidence of high-grade spinal canal stenosis. Mild-to-moderate left neural foraminal stenosis at C5-6. Upper chest: Biapical pleuroparenchymal lung scarring. Other: Moderate atherosclerotic calcification at the carotid bifurcations. Critical Value/emergent results were called  by telephone at the time of interpretation on 06/18/2022 at 4:20 pm to Dr. Cyril Loosen, who verbally acknowledged these results. IMPRESSION: 1. Small acute left-sided subdural hematoma without significant mass effect. 2. No acute maxillofacial or cervical spine fracture. Electronically Signed   By: Sebastian Ache M.D.   On: 06/18/2022 16:20   CT Maxillofacial Wo Contrast  Result Date: 06/18/2022 CLINICAL DATA:  Head trauma, minor (Age >= 65y); Facial trauma, blunt; Neck trauma (Age >= 65y). Fall. EXAM: CT HEAD WITHOUT CONTRAST CT MAXILLOFACIAL WITHOUT CONTRAST CT CERVICAL SPINE WITHOUT CONTRAST TECHNIQUE: Multidetector CT imaging of the head, cervical spine, and maxillofacial structures were performed using the standard protocol without intravenous contrast. Multiplanar CT image reconstructions of the cervical spine and maxillofacial structures were also generated. RADIATION DOSE REDUCTION: This exam was performed according to the departmental dose-optimization program which includes automated exposure control, adjustment of the mA and/or kV according to patient size and/or use of iterative reconstruction technique. COMPARISON:  Head MRI 12/05/2014 FINDINGS: CT HEAD FINDINGS Brain: A small acute subdural hematoma over the left cerebral convexity measures up to 5 mm in thickness without significant mass effect. No acute infarct, mass, or midline shift is identified. There is mild cerebral atrophy. Vascular: Calcified atherosclerosis at the skull base. Skull: No acute fracture or suspicious osseous lesion. Other: None. CT MAXILLOFACIAL FINDINGS Osseous: No acute fracture or mandibular dislocation. Bilateral TMJ arthropathy with condylar flattening. Orbits: Bilateral cataract extraction. Sinuses: Complete opacification of the right frontal sinus and right anterior and mid ethmoid air cells. Clear mastoid air cells. Soft tissues: Unremarkable. CT CERVICAL SPINE FINDINGS Alignment: Reversal of the normal cervical lordosis.  Grade 1 anterolisthesis of C3 on C4, C7 on T1, and T1 on T2. Trace retrolisthesis of C5 on C6.  Mild left convex curvature of the cervical spine. Skull base and vertebrae: Moderately advanced median C1-2 arthropathy. No acute fracture or suspicious osseous lesion. Soft tissues and spinal canal: No prevertebral fluid or swelling. No visible canal hematoma. Disc levels: Diffuse disc degeneration, most advanced at C4-5. Advanced multilevel facet arthrosis. No evidence of high-grade spinal canal stenosis. Mild-to-moderate left neural foraminal stenosis at C5-6. Upper chest: Biapical pleuroparenchymal lung scarring. Other: Moderate atherosclerotic calcification at the carotid bifurcations. Critical Value/emergent results were called by telephone at the time of interpretation on 06/18/2022 at 4:20 pm to Dr. Cyril Loosen, who verbally acknowledged these results. IMPRESSION: 1. Small acute left-sided subdural hematoma without significant mass effect. 2. No acute maxillofacial or cervical spine fracture. Electronically Signed   By: Sebastian Ache M.D.   On: 06/18/2022 16:20    Assessment and Plan  Laura Carpenter is a 87 y.o. female with medical history significant of Alzheimer's dementia, chronic hyponatremia, hypertension, hyperlipidemia, hypothyroidism, fibromuscular dysplasia of the renal artery, who presents to the ED due to a ground-level fall   Subdural hematoma subarachnoid hemorrhage post mechanical fall times two -- patient seen by neurosurgery Dr. Adriana Simas. Continue to monitor. -- Holding aspirin -- patient worked with physical therapy. TOC for discharge planning. Will have patient work with PT/OT tomorrow to make.  Hyponatremia acute on chronic -- suspect poor PO intake and likely due to subdural hematoma -- sodium improving  Respiratory/viral infection metapneumovirus -- treat symptoms. -Give empiric antibiotic given significant amount of cough and congestion  Alzheimer's dementia -- continue home  meds  Left shoulder pain worse than after fall -- left shoulder x-ray no traumatic injury. Shows significant degenerative arthritis and questionable capsulitis/adhesivitis -- discussed with patient's daughter to treat with PRN pain meds and if worsens consider orthopedic referral.  Hypothyroidism -- continue Synthroid   Procedures: Family communication : daughter and husband at bedside Consults : neurosurgery Dr. Adriana Simas CODE STATUS: DNR DVT Prophylaxis : SCD Level of care: Progressive Status is: Observation The patient remains OBS appropriate and will d/c before 2 midnights. Patient if remains stable and works well with physical therapy she could be discharged home with home health tomorrow.  TOTAL TIME TAKING CARE OF THIS PATIENT: 35 minutes.  >50% time spent on counselling and coordination of care  Note: This dictation was prepared with Dragon dictation along with smaller phrase technology. Any transcriptional errors that result from this process are unintentional.  Enedina Finner M.D    [facility]   CC: Primary care physician; Dana Allan, MD

## 2022-06-19 NOTE — Progress Notes (Signed)
  Echocardiogram 2D Echocardiogram has been performed.  Laura Carpenter 06/19/2022, 1:54 PM

## 2022-06-19 NOTE — Evaluation (Addendum)
Physical Therapy Evaluation Patient Details Name: STAVROULA NICKEL MRN: [id number] DOB: [birth year] Today's Date: 06/19/2022  History of Present Illness  Patient is a 87 year old female with acute respiratory failure with hypoxia, A fib with RVR, SDH. Recent fall with CT showing very small extra axial hematoma on left. Repeat scan shows stable blood but small focus of traumatic SAH on right. History of dementia, hypothyroidism, HTN, chronic hyponatremia  Clinical Impression  Patient is agreeable to PT evaluation. She was sleeping on arrival to room but easily wakes up to voice. She is pleasant and able to follow single step commands consistently. She lives with her spouse and reports that she ambulates without assistive device at baseline. She reports this is the only fall that she has had in the past 6 months.  The patient required Min guard assistance with increased time for bed mobility. Several standing bouts performed from bed and from toilet with Min A for steadying. Mild posterior lean initially with standing that required Min A. Encouraged patient to use the rolling walker for safety with ambulation given poor balance initially with standing. She required intermittent cues to keep the left hand on the rolling walker and steadying assistance provided with short distance ambulation. Sp02 92% on room air with walking and heart rate in the low 80's. Recommend to continue PT to maximize independence and decrease caregiver burden. Patient is hopeful to return home. Anticipate the need for frequent supervision at discharge with continued PT recommended after this hospital stay.      Recommendations for follow up therapy are one component of a multi-disciplinary discharge planning process, led by the attending physician.  Recommendations may be updated based on patient status, additional functional criteria and insurance authorization.  Follow Up Recommendations       Assistance Recommended at Discharge  Frequent or constant Supervision/Assistance  Patient can return home with the following  A little help with walking and/or transfers;A little help with bathing/dressing/bathroom;Assist for transportation;Help with stairs or ramp for entrance;Assistance with cooking/housework;Direct supervision/assist for medications management    Equipment Recommendations None recommended by PT  Recommendations for Other Services       Functional Status Assessment Patient has had a recent decline in their functional status and demonstrates the ability to make significant improvements in function in a reasonable and predictable amount of time.     Precautions / Restrictions Precautions Precautions: Fall Restrictions Weight Bearing Restrictions: No      Mobility  Bed Mobility Overal bed mobility: Needs Assistance Bed Mobility: Supine to Sit, Sit to Supine     Supine to sit: Min guard Sit to supine: Min guard   General bed mobility comments: increased time required    Transfers Overall transfer level: Needs assistance Equipment used: Rolling walker (2 wheels) Transfers: Sit to/from Stand Sit to Stand: Min assist           General transfer comment: steadying assistance provided. 3 bouts of standing performed from bed and from toilet, and both with the walker and without walker. verbal cues for hand placement for safety    Ambulation/Gait Ambulation/Gait assistance: Min assist, Min guard Gait Distance (Feet): 30 Feet Assistive device: Rolling walker (2 wheels) Gait Pattern/deviations: Step-through pattern, Narrow base of support Gait velocity: decreased     General Gait Details: patient required occasional steadying assistance and cues to keep left hand on rolling walker as patient intermittently reaching for furniture for support with left hand despite having the walker. occasional assistance for rolling walker  negotiation as well with turns  Risk analyst Rankin (Stroke Patients Only)       Balance Overall balance assessment: Needs assistance Sitting-balance support: Feet supported Sitting balance-Leahy Scale: Good   Postural control: Posterior lean Standing balance support: No upper extremity supported Standing balance-Leahy Scale: Poor Standing balance comment: posterior lean initially with standing that required  Min A for steadying and anterior weight shifting                             Pertinent Vitals/Pain Pain Assessment Pain Assessment: No/denies pain    Home Living Family/patient expects to be discharged to:: Private residence Living Arrangements: Spouse/significant other Available Help at Discharge: Family Type of Home: House Home Access:  (no steps in side porch)       Home Layout: Full bath on main level;Able to live on main level with bedroom/bathroom;Two level Home Equipment: Rollator (4 wheels);Cane - single point;BSC/3in1;Shower seat;Grab bars - tub/shower      Prior Function Prior Level of Function : Independent/Modified Independent             Mobility Comments: patient reports no other falls in the past 6 months       Hand Dominance        Extremity/Trunk Assessment   Upper Extremity Assessment Upper Extremity Assessment: Defer to OT evaluation    Lower Extremity Assessment Lower Extremity Assessment: Generalized weakness       Communication   Communication: No difficulties  Cognition Arousal/Alertness: Awake/alert Behavior During Therapy: WFL for tasks assessed/performed Overall Cognitive Status: History of cognitive impairments - at baseline                                 General Comments: Patient is able to follow single step commands consistently. She is disoriented to time        General Comments General comments (skin integrity, edema, etc.): heart rate in the low 80's with walking and Sp02 92% on room air. patient is  fatigued with activity    Exercises     Assessment/Plan    PT Assessment Patient needs continued PT services  PT Problem List Decreased strength;Decreased activity tolerance;Decreased balance;Decreased mobility;Decreased cognition;Decreased knowledge of use of DME;Decreased safety awareness;Decreased knowledge of precautions       PT Treatment Interventions DME instruction;Gait training;Stair training;Functional mobility training;Therapeutic activities;Therapeutic exercise;Balance training;Neuromuscular re-education;Cognitive remediation;Patient/family education    PT Goals (Current goals can be found in the Care Plan section)  Acute Rehab PT Goals Patient Stated Goal: to go home PT Goal Formulation: With patient Time For Goal Achievement: 07/03/22 Potential to Achieve Goals: Fair    Frequency Min 3X/week     Co-evaluation               AM-PAC PT "6 Clicks" Mobility  Outcome Measure Help needed turning from your back to your side while in a flat bed without using bedrails?: None Help needed moving from lying on your back to sitting on the side of a flat bed without using bedrails?: A Little Help needed moving to and from a bed to a chair (including a wheelchair)?: A Little Help needed standing up from a chair using your arms (e.g., wheelchair or bedside chair)?: A Little Help needed to walk in hospital room?: A Little Help  needed climbing 3-5 steps with a railing? : A Lot 6 Click Score: 18    End of Session Equipment Utilized During Treatment: Gait belt Activity Tolerance: Patient limited by fatigue Patient left: in bed;with call bell/phone within reach;with bed alarm set Nurse Communication: Mobility status PT Visit Diagnosis: Unsteadiness on feet (R26.81);Muscle weakness (generalized) (M62.81)    Time: 1610-9604 PT Time Calculation (min) (ACUTE ONLY): 15 min   Charges:   PT Evaluation $PT Eval Low Complexity: 1 Low          Donna Bernard, PT,  MPT   Ina Homes 06/19/2022, 11:26 AM

## 2022-06-19 NOTE — Evaluation (Signed)
Occupational Therapy Evaluation Patient Details Name: Laura Carpenter MRN: [id number] DOB: 1935-04-21 Today's Date: 06/19/2022   History of Present Illness Patient is a 87 year old female with acute respiratory failure with hypoxia, A fib with RVR, SDH. Recent fall with CT showing very small extra axial hematoma on left. Repeat scan shows stable blood but small focus of traumatic SAH on right. History of dementia, hypothyroidism, HTN, chronic hyponatremia. 2 falls just prior to admission. Otherwise, family denies falls.   Clinical Impression   Patient received for OT evaluation. See flowsheet below for details of function. Generally, patient requiring SBA-MIN A for bed mobility, CGA with RW and gait belt and cues for functional mobility to bathroom, and MIN A-CGA for ADLs. Pt's cognitive deficits and gait making her high risk for future falls; discussed this extensively with family who arrived at the end of the session.  Patient will benefit from continued OT while in acute care.       Recommendations for follow up therapy are one component of a multi-disciplinary discharge planning process, led by the attending physician.  Recommendations may be updated based on patient status, additional functional criteria and insurance authorization.   Assistance Recommended at Discharge Frequent or constant Supervision/Assistance  Patient can return home with the following A little help with walking and/or transfers;A little help with bathing/dressing/bathroom;Assistance with cooking/housework;Direct supervision/assist for medications management;Direct supervision/assist for financial management;Assist for transportation;Help with stairs or ramp for entrance;Other (comment) (supervision due to poor safety awareness and poor walker compliance)    Functional Status Assessment  Patient has had a recent decline in their functional status and demonstrates the ability to make significant improvements in function in  a reasonable and predictable amount of time.  Equipment Recommendations  None recommended by OT (has walker and BSC and shower chair)    Recommendations for Other Services       Precautions / Restrictions Precautions Precautions: Fall Restrictions Weight Bearing Restrictions: No      Mobility Bed Mobility Overal bed mobility: Needs Assistance Bed Mobility: Supine to Sit, Sit to Supine     Supine to sit: Supervision, HOB elevated Sit to supine: Min assist (pt with difficulty repositioning her shoulders in bed)   General bed mobility comments: increased time required    Transfers Overall transfer level: Needs assistance Equipment used: Rolling walker (2 wheels) (gait belt used for safety) Transfers: Sit to/from Stand Sit to Stand: Min guard                  Balance Overall balance assessment: Needs assistance Sitting-balance support: Feet supported Sitting balance-Leahy Scale: Good     Standing balance support: Reliant on assistive device for balance Standing balance-Leahy Scale: Fair Standing balance comment: poor insight into the need for RW                           ADL either performed or assessed with clinical judgement   ADL Overall ADL's : Needs assistance/impaired   Eating/Feeding Details (indicate cue type and reason): anticipate (I) Grooming: Set up;Min guard;Standing;Oral care;Wash/dry hands;Cueing for sequencing Grooming Details (indicate cue type and reason): after using toilet and performing hygiene for bowel movement, pt needing OT cues at sink to wash hands first prior to brushing teeth; after washing hands   Upper Body Bathing Details (indicate cue type and reason): recommend seated on shower chair (supervision)   Lower Body Bathing Details (indicate cue type and reason): recommend supervision from  seated on shower chair   Upper Body Dressing Details (indicate cue type and reason): anticipate set up from seated Lower Body  Dressing: Min guard;Supervision/safety;Sit to/from stand Lower Body Dressing Details (indicate cue type and reason): Pt able to make figure four and lean forward to doff socks and don slip-on shoes. Pt needing cue to doff soiled underwear. Able to don new underwear with set up and CGA while standing for balance. Toilet Transfer: Surveyor, quantity Details (indicate cue type and reason): Pt needing BIL hands on grab bar to t/f sit to stand. Cues from OT on stand to sit to be sure she felt toilet behind her before sitting. Toileting- Architect and Hygiene: Min guard;Sit to/from stand Toileting - Clothing Manipulation Details (indicate cue type and reason): OT CGA for safety while pt pulling down underwear; pt needing reminder to pull down underwear prior to t/f to sitting. Seated hygiene for bowel and bladder; pt able to get toilet paper without assistance.   Tub/Shower Transfer Details (indicate cue type and reason): would recommed CGA and use of grab bar Functional mobility during ADLs: Min guard;Rolling walker (2 wheels);Cueing for safety (cues to remain with RW; pt abandoning RW outside bathroom door without OT cues to keep using it.) General ADL Comments: Pt with decreased insight into deficits; decreased STM. She remains a high risk for future falls. Discussed this with family who entered room at end of session.     Vision Baseline Vision/History: 1 Wears glasses       Perception     Praxis      Pertinent Vitals/Pain Pain Assessment Pain Assessment: No/denies pain     Hand Dominance Right   Extremity/Trunk Assessment Upper Extremity Assessment Upper Extremity Assessment: Generalized weakness;Overall WFL for tasks assessed   Lower Extremity Assessment Lower Extremity Assessment: Defer to PT evaluation   Cervical / Trunk Assessment Cervical / Trunk Assessment: Normal   Communication Communication Communication: HOH (does not have her  hearing aides in)   Cognition Arousal/Alertness: Awake/alert Behavior During Therapy: WFL for tasks assessed/performed Overall Cognitive Status: History of cognitive impairments - at baseline                                 General Comments: Pt follows single step commands with increased OT volume of speaking. Pt is aware that Memorial Day is coming up, but unable to state the date. States she would like to go home. Knows she is in the hospital; recognizes family when they enter. Pt with poor STM (not remembering that she hadn't brushed her teeth; thinking she has clothes in her bag when she doesn't); vague (unable to state the books she's been reading, but does know they are in the bag); poor compliance with RW requiring OT cues.     General Comments  Pt on 1L O2 at beginning of session; saturation in high 90's. OT removed O2 during session and pt sats 90-93% with activity. Upon return to resting semi-reclined in bed, sats 96% on RA. RN notified. Pt mobilized to toilet during session and stood at sink for grooming.    Exercises     Shoulder Instructions      Home Living Family/patient expects to be discharged to:: Private residence Living Arrangements: Spouse/significant other Available Help at Discharge: Family Type of Home: House Home Access:  (no steps in side porch)     Home Layout: Full bath on main  level;Able to live on main level with bedroom/bathroom;Two level     Bathroom Shower/Tub: Producer, television/film/video: Handicapped height Bathroom Accessibility: Yes How Accessible: Accessible via walker Home Equipment: Rollator (4 wheels);Cane - single point;BSC/3in1;Shower seat;Grab bars - tub/shower          Prior Functioning/Environment Prior Level of Function : Needs assist  Cognitive Assist : ADLs (cognitive)   ADLs (Cognitive): Intermittent cues       Mobility Comments: family denies other falls recently; two falls just prior to admission.  Family recently got her a rollator; she hasn't used it much. Family states she does shuffle while walking. ADLs Comments: (I) with ADLs. Husband drives and manages her medications. Pt has dementia.        OT Problem List: Impaired balance (sitting and/or standing);Decreased cognition;Decreased safety awareness;Decreased knowledge of use of DME or AE      OT Treatment/Interventions: Self-care/ADL training;Therapeutic exercise;Patient/family education;DME and/or AE instruction;Therapeutic activities    OT Goals(Current goals can be found in the care plan section) Acute Rehab OT Goals Patient Stated Goal: Go home today. OT Goal Formulation: With patient/family Time For Goal Achievement: 07/03/22 Potential to Achieve Goals: Good ADL Goals Pt Will Perform Grooming: with supervision;standing Pt Will Perform Lower Body Bathing: with supervision;sit to/from stand Pt Will Perform Lower Body Dressing: with supervision;sit to/from stand Pt Will Transfer to Toilet: with supervision;regular height toilet;ambulating Pt Will Perform Toileting - Clothing Manipulation and hygiene: with supervision;sit to/from stand  OT Frequency: Min 2X/week    Co-evaluation              AM-PAC OT "6 Clicks" Daily Activity     Outcome Measure Help from another person eating meals?: None Help from another person taking care of personal grooming?: A Little Help from another person toileting, which includes using toliet, bedpan, or urinal?: A Little Help from another person bathing (including washing, rinsing, drying)?: A Little Help from another person to put on and taking off regular upper body clothing?: None Help from another person to put on and taking off regular lower body clothing?: A Little 6 Click Score: 20   End of Session Equipment Utilized During Treatment: Gait belt;Rolling walker (2 wheels) (grab bar next to toilet) Nurse Communication: Mobility status;Other (comment) (bed alarm status; O2  status.)  Activity Tolerance: Patient tolerated treatment well Patient left: in bed;with call bell/phone within reach;with family/visitor present;Other (comment) (family in room; bed alarm not set because husband seated on EOB)  OT Visit Diagnosis: Unsteadiness on feet (R26.81);Repeated falls (R29.6);Other abnormalities of gait and mobility (R26.89)                Time: 1610-9604 OT Time Calculation (min): 55 min Charges:  OT General Charges $OT Visit: 1 Visit OT Evaluation $OT Eval Moderate Complexity: 1 Mod OT Treatments $Self Care/Home Management : 38-52 mins  Linward Foster, MS, OTR/L  Alvester Morin 06/19/2022, 11:57 AM

## 2022-06-19 NOTE — Progress Notes (Signed)
Initial Nutrition Assessment  DOCUMENTATION CODES:   Not applicable  INTERVENTION:  - Add Ensure Enlive po BID, each supplement provides 350 kcal and 20 grams of protein.  NUTRITION DIAGNOSIS:   Inadequate oral intake related to poor appetite as evidenced by meal completion < 25%.  GOAL:   Patient will meet greater than or equal to 90% of their needs  MONITOR:   PO intake  REASON FOR ASSESSMENT:   Consult Assessment of nutrition requirement/status  ASSESSMENT:   87 y.o. female admits related to fall and arm injury. PMH includes: Alzheimer's dementia, HTN, HLD, breast cancer. Pt is currently receiving medical management related to subdural hematoma.  Meds reviewed: lipitor, Vit B12, folic acid, remeron, MVI, thiamine. Labs reviewed:  Na low.   RD attempted to call pt's room but no answer. Per record, RN documents that the pt only ate bites of her breakfast tray this am. No significant wt loss per record. RD will add Ensure BID. RD will continue to closely monitor PO intakes.   NUTRITION - FOCUSED PHYSICAL EXAM:  Remote assessment.   Diet Order:   Diet Order             Diet regular Room service appropriate? Yes; Fluid consistency: Thin  Diet effective now                   EDUCATION NEEDS:   Not appropriate for education at this time  Skin:  Skin Assessment: Reviewed RN Assessment  Last BM:  5/25 - type 6  Height:   Ht Readings from Last 1 Encounters:  06/18/22 5\' 7"  (1.702 m)    Weight:   Wt Readings from Last 1 Encounters:  06/18/22 54.4 kg    Ideal Body Weight:     BMI:  Body mass index is 18.79 kg/m.  Estimated Nutritional Needs:   Kcal:  1630-1900 kcals  Protein:  80-95 gm  Fluid:  >/= 1.6 L  Bethann Humble, RD, LDN, CNSC.

## 2022-06-19 NOTE — Progress Notes (Signed)
   Progress Note   Date: 06/19/2022  HPI: [name] is admitted with concern for recent falls and confusion. She was found to have hyponatremia and concern for pulmonary issue as well as CT head showing a small left side acute SDH  I saw [name] today and she is awake and alert. She is getting an echocardiogram at this time. She does not endorse any new concerns.   Vital Signs: Temp:  [98 F (36.7 C)-100.3 F (37.9 C)] 98 F (36.7 C) (05/26 1200) Pulse Rate:  [55-151] 65 (05/26 1200) Resp:  [14-30] 23 (05/26 1200) BP: (97-160)/(67-122) 151/92 (05/26 1200) SpO2:  [92 %-100 %] 98 % (05/26 1200) Temp (24hrs), Avg:98.9 F (37.2 C), Min:98 F (36.7 C), Max:100.3 F (37.9 C)  Weight: 54.4 kg @IOLAST2SHIFTS @  Problem List Patient Active Problem List   Diagnosis Date Noted   Subdural hematoma (HCC) 06/18/2022   Altered mental status 06/18/2022   Left shoulder pain 06/18/2022   Elevated LFTs 02/20/2022   Physical deconditioning 01/03/2022   Breast nodule 11/11/2021   Weight loss 12/09/2020   Balance problem 10/28/2020   Hyponatremia 06/28/2018   HLD (hyperlipidemia) 06/28/2018   History of breast cancer 06/28/2018   B12 deficiency 06/28/2018   Skin cancer    Hypothyroidism    Hypertension    Sleep apnea    Fibromuscular dysplasia of renal artery (HCC)    Dementia (HCC)    Secondary polycythemia 10/17/2014   Hearing loss 03/23/2012    Medications: Scheduled Meds:  amLODipine  2.5 mg Oral Daily   And   benazepril  10 mg Oral Daily   atorvastatin  10 mg Oral Daily   azithromycin  500 mg Oral QHS   cyanocobalamin  1,000 mcg Oral Daily   [START ON 06/20/2022] feeding supplement  237 mL Oral BID BM   folic acid  1 mg Oral Daily   levothyroxine  125 mcg Oral Q0600   mirtazapine  7.5 mg Oral QHS   multivitamin with minerals  1 tablet Oral Daily   rivastigmine  9.5 mg Transdermal Daily   sodium chloride flush  3 mL Intravenous Q12H   thiamine  100 mg Oral Daily     Labs:  Lab Results  Component Value Date   WBC 5.4 06/19/2022   WBC 5.2 06/18/2022   HCT 37.8 06/19/2022   HCT 41.9 06/18/2022   PLT 166 06/19/2022   PLT 184 06/18/2022   No results found for: "LABPT", "INR", "APTT"  Lab Results  Component Value Date   NA 128 (L) 06/19/2022   NA 126 (L) 06/18/2022   K 4.7 06/19/2022   K 3.4 (L) 06/18/2022   BUN 18 06/19/2022   BUN 14 06/18/2022    Lab Results  Component Value Date   MG 1.9 06/18/2022    Exam deferred due to testing being performed  Imaging: CT Head: 1. Unchanged left convexity subdural hematoma. 2. New focus of subarachnoid hemorrhage within the right Sylvian fissure.  A/P:  No intervention needed for the small areas of SDH and SAH. Patient is clear from neurosurgery perspective for outpatient follow up  - Hold anticoagulant and antiplatelet for 1 week, OK for  DVT prophylaxis now - Avoid falls - f/u in 2-3 weeks in clinic with CT  Lucy Chris, MD

## 2022-06-19 NOTE — TOC Initial Note (Signed)
Transition of Care [facility]) - Initial/Assessment Note    Patient Details  Name: Laura Carpenter MRN: [id number] Date of Birth: [birth year]  Transition of [facility]) CM/SW Contact:    Kemper Durie, RN Phone Number: 06/19/2022, 1:15 PM  Clinical Narrative:                  Spoke with son Freida Busman, state patient lives home with husband, who provides transportation to medical appointments.  Uses [facility], but unable to confirm if listed PCP is correct (Dr. Clent Ridges).  Attempted to discuss recommendations per PT/OT eval, son request to wait to discuss so the family can have a chance to figure out what the patient needs are and what they can assist with.  TOC will follow up tomorrow to further discuss discharge planning.   Expected Discharge Plan: Home w Home Health Services Barriers to Discharge: Continued Medical Work up   Patient Goals and CMS Choice            Expected Discharge Plan and Services       Living arrangements for the past 2 months: Single Family Home                                      Prior Living Arrangements/Services Living arrangements for the past 2 months: Single Family Home Lives with:: Spouse              Current home services: DME    Activities of Daily Living      Permission Sought/Granted                  Emotional Assessment              Admission diagnosis:  Hyponatremia [E87.1] Subdural hematoma (HCC) [S06.5XAA] SDH (subdural hematoma) (HCC) [S06.5XAA] Fall in home, initial encounter [[email or web address].Lorne Skeens, Y92.009] Patient Active Problem List   Diagnosis Date Noted   Subdural hematoma (HCC) 06/18/2022   Altered mental status 06/18/2022   Left shoulder pain 06/18/2022   Elevated LFTs 02/20/2022   Physical deconditioning 01/03/2022   Breast nodule 11/11/2021   Weight loss 12/09/2020   Balance problem 10/28/2020   Hyponatremia 06/28/2018   HLD (hyperlipidemia) 06/28/2018   History of breast cancer 06/28/2018   B12  deficiency 06/28/2018   Skin cancer    Hypothyroidism    Hypertension    Sleep apnea    Fibromuscular dysplasia of renal artery (HCC)    Dementia (HCC)    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 [place] [address] Phone: [phone] Fax: [phone]  [id number] [place], [place] - [address] AT [facility] [address] [place] [address] Phone: [phone] Fax: [phone]     Social Determinants of Health (SDOH) Social History: SDOH Screenings   Depression (PHQ2-9): Low Risk  (02/22/2022)  Tobacco Use: Medium Risk (06/18/2022)   SDOH Interventions:     Readmission Risk Interventions     No data to display

## 2022-06-20 DIAGNOSIS — E871 Hypo-osmolality and hyponatremia: Secondary | ICD-10-CM | POA: Diagnosis not present

## 2022-06-20 DIAGNOSIS — M1990 Unspecified osteoarthritis, unspecified site: Secondary | ICD-10-CM | POA: Diagnosis present

## 2022-06-20 DIAGNOSIS — Z7982 Long term (current) use of aspirin: Secondary | ICD-10-CM | POA: Diagnosis not present

## 2022-06-20 DIAGNOSIS — Z8249 Family history of ischemic heart disease and other diseases of the circulatory system: Secondary | ICD-10-CM | POA: Diagnosis not present

## 2022-06-20 DIAGNOSIS — Z85828 Personal history of other malignant neoplasm of skin: Secondary | ICD-10-CM | POA: Diagnosis not present

## 2022-06-20 DIAGNOSIS — Z66 Do not resuscitate: Secondary | ICD-10-CM | POA: Diagnosis present

## 2022-06-20 DIAGNOSIS — F02B0 Dementia in other diseases classified elsewhere, moderate, without behavioral disturbance, psychotic disturbance, mood disturbance, and anxiety: Secondary | ICD-10-CM | POA: Diagnosis present

## 2022-06-20 DIAGNOSIS — E222 Syndrome of inappropriate secretion of antidiuretic hormone: Secondary | ICD-10-CM | POA: Diagnosis present

## 2022-06-20 DIAGNOSIS — F0390 Unspecified dementia without behavioral disturbance: Secondary | ICD-10-CM | POA: Diagnosis not present

## 2022-06-20 DIAGNOSIS — E43 Unspecified severe protein-calorie malnutrition: Secondary | ICD-10-CM | POA: Insufficient documentation

## 2022-06-20 DIAGNOSIS — F05 Delirium due to known physiological condition: Secondary | ICD-10-CM | POA: Diagnosis present

## 2022-06-20 DIAGNOSIS — S066X0A Traumatic subarachnoid hemorrhage without loss of consciousness, initial encounter: Secondary | ICD-10-CM | POA: Diagnosis present

## 2022-06-20 DIAGNOSIS — E785 Hyperlipidemia, unspecified: Secondary | ICD-10-CM | POA: Diagnosis present

## 2022-06-20 DIAGNOSIS — Z170 Estrogen receptor positive status [ER+]: Secondary | ICD-10-CM | POA: Diagnosis not present

## 2022-06-20 DIAGNOSIS — I4891 Unspecified atrial fibrillation: Secondary | ICD-10-CM | POA: Diagnosis present

## 2022-06-20 DIAGNOSIS — Z923 Personal history of irradiation: Secondary | ICD-10-CM | POA: Diagnosis not present

## 2022-06-20 DIAGNOSIS — Z79890 Hormone replacement therapy (postmenopausal): Secondary | ICD-10-CM | POA: Diagnosis not present

## 2022-06-20 DIAGNOSIS — J208 Acute bronchitis due to other specified organisms: Secondary | ICD-10-CM | POA: Diagnosis not present

## 2022-06-20 DIAGNOSIS — S065X0A Traumatic subdural hemorrhage without loss of consciousness, initial encounter: Secondary | ICD-10-CM | POA: Diagnosis present

## 2022-06-20 DIAGNOSIS — H9190 Unspecified hearing loss, unspecified ear: Secondary | ICD-10-CM | POA: Diagnosis present

## 2022-06-20 DIAGNOSIS — I10 Essential (primary) hypertension: Secondary | ICD-10-CM | POA: Diagnosis present

## 2022-06-20 DIAGNOSIS — W010XXA Fall on same level from slipping, tripping and stumbling without subsequent striking against object, initial encounter: Secondary | ICD-10-CM | POA: Diagnosis present

## 2022-06-20 DIAGNOSIS — S065XAA Traumatic subdural hemorrhage with loss of consciousness status unknown, initial encounter: Secondary | ICD-10-CM | POA: Diagnosis not present

## 2022-06-20 DIAGNOSIS — G301 Alzheimer's disease with late onset: Secondary | ICD-10-CM | POA: Diagnosis present

## 2022-06-20 DIAGNOSIS — J9601 Acute respiratory failure with hypoxia: Secondary | ICD-10-CM | POA: Diagnosis present

## 2022-06-20 DIAGNOSIS — Z681 Body mass index (BMI) 19 or less, adult: Secondary | ICD-10-CM | POA: Diagnosis not present

## 2022-06-20 DIAGNOSIS — B9781 Human metapneumovirus as the cause of diseases classified elsewhere: Secondary | ICD-10-CM | POA: Diagnosis present

## 2022-06-20 DIAGNOSIS — E039 Hypothyroidism, unspecified: Secondary | ICD-10-CM | POA: Diagnosis present

## 2022-06-20 DIAGNOSIS — Z87891 Personal history of nicotine dependence: Secondary | ICD-10-CM | POA: Diagnosis not present

## 2022-06-20 LAB — BASIC METABOLIC PANEL
Anion gap: 10 (ref 5–15)
BUN: 13 mg/dL (ref 8–23)
CO2: 28 mmol/L (ref 22–32)
Calcium: 9.5 mg/dL (ref 8.9–10.3)
Chloride: 85 mmol/L — ABNORMAL LOW (ref 98–111)
Creatinine, Ser: 0.6 mg/dL (ref 0.44–1.00)
GFR, Estimated: >60 mL/min (ref >60)
Glucose, Bld: 163 mg/dL — ABNORMAL HIGH (ref 70–99)
Potassium: 3.5 mmol/L (ref 3.5–5.1)
Sodium: 123 mmol/L — ABNORMAL LOW (ref 135–145)

## 2022-06-20 LAB — VITAMIN B12: Vitamin B-12: 1582 pg/mL — ABNORMAL HIGH (ref 180–914)

## 2022-06-20 MED ORDER — AMLODIPINE BESYLATE 5 MG PO TABS
2.5000 mg | ORAL_TABLET | Freq: Once | ORAL | Status: AC
  Administered 2022-06-20: 2.5 mg via ORAL
  Filled 2022-06-20: qty 1

## 2022-06-20 MED ORDER — ENOXAPARIN SODIUM 40 MG/0.4ML IJ SOSY
40.0000 mg | PREFILLED_SYRINGE | INTRAMUSCULAR | Status: DC
  Administered 2022-06-20 – 2022-06-22 (×3): 40 mg via SUBCUTANEOUS
  Filled 2022-06-20 (×3): qty 0.4

## 2022-06-20 MED ORDER — AMLODIPINE BESYLATE 5 MG PO TABS
5.0000 mg | ORAL_TABLET | Freq: Every day | ORAL | Status: DC
  Administered 2022-06-21 – 2022-06-23 (×3): 5 mg via ORAL
  Filled 2022-06-20 (×3): qty 1

## 2022-06-20 MED ORDER — BENAZEPRIL HCL 10 MG PO TABS
10.0000 mg | ORAL_TABLET | Freq: Every day | ORAL | Status: DC
  Administered 2022-06-21 – 2022-06-23 (×3): 10 mg via ORAL
  Filled 2022-06-20 (×3): qty 1

## 2022-06-20 NOTE — Progress Notes (Signed)
Initial Nutrition Assessment  DOCUMENTATION CODES:   Severe malnutrition in context of chronic illness  INTERVENTION:   -Continue with regular diet -MVI with minerals daily -Continue Ensure Enlive po BID, each supplement provides 350 kcal and 20 grams of protein -Magic cup BID with meals, each supplement provides 290 kcal and 9 grams of protein   NUTRITION DIAGNOSIS:   Severe Malnutrition related to chronic illness (dementia) as evidenced by moderate fat depletion, severe fat depletion, moderate muscle depletion, severe muscle depletion.  Ongoing  GOAL:   Patient will meet greater than or equal to 90% of their needs  Progressing   MONITOR:   PO intake, Supplement acceptance  REASON FOR ASSESSMENT:   Consult Assessment of nutrition requirement/status  ASSESSMENT:   87 y.o. female admits related to fall and arm injury. PMH includes: Alzheimer's dementia, HTN, HLD, breast cancer. Pt is currently receiving medical management related to subdural hematoma.  Pt admitted with AMS and SDH.   Reviewed I/O's: -150 ml x 24 hours and -652 ml since admission  UOP: 500 ml x 24 hours   Per neurosurgery notes, CT revealed very small lt axial hematoma and traumatic SAH. Neurosurgery has signed off and plans outpatient follow-up.   Spoke with pt at bedside, who was pleasant and in good spirits today. Pt shares that she is feeling better. Noted she consumed 100% of her lunch. Pt denies any difficulty chewing or swallowing foods.   PTA, pt had a good appetite, consuming 3 meals per day (Breakfast: eggs and pancakes, Lunch: sandwich; DInner: meat, starch, and vegetable). Pt lived at home with her husband PTA and they shared cooking responsibilities.   Reviewed wt hx; no wt loss noted over the past 4 months. Pt reports she has always been small framed. Her UBW is around 125#.   Discussed importance of good meal and supplement intake to promote healing. Pt amenable to continue Ensure  supplements.   Medications reviewed and include vitamin B-12, folic acid, and thiamine.   Labs reviewed: Na: 123.    NUTRITION - FOCUSED PHYSICAL EXAM:  Flowsheet Row Most Recent Value  Orbital Region Severe depletion  Upper Arm Region Severe depletion  Thoracic and Lumbar Region Moderate depletion  Buccal Region Moderate depletion  Temple Region Severe depletion  Clavicle Bone Region Severe depletion  Clavicle and Acromion Bone Region Severe depletion  Scapular Bone Region Severe depletion  Dorsal Hand Severe depletion  Patellar Region Moderate depletion  Anterior Thigh Region Moderate depletion  Posterior Calf Region Moderate depletion  Edema (RD Assessment) None  Hair Reviewed  Eyes Reviewed  Mouth Reviewed  Skin Reviewed  Nails Reviewed       Diet Order:   Diet Order             Diet regular Room service appropriate? Yes; Fluid consistency: Thin  Diet effective now                   EDUCATION NEEDS:   Education needs have been addressed  Skin:  Skin Assessment: Reviewed RN Assessment  Last BM:  06/20/22 (type 6)  Height:   Ht Readings from Last 1 Encounters:  06/18/22 5\' 7"  (1.702 m)    Weight:   Wt Readings from Last 1 Encounters:  06/18/22 54.4 kg    Ideal Body Weight:  61.4 kg  BMI:  Body mass index is 18.79 kg/m.  Estimated Nutritional Needs:   Kcal:  1650-1850  Protein:  80-95 grams  Fluid:  > 1.6 L  Levada Schilling, RD, LDN, CDCES Registered Dietitian II Certified Diabetes Care and Education Specialist Please refer to [facility] for RD and/or RD on-call/weekend/after hours pager

## 2022-06-20 NOTE — Progress Notes (Signed)
Physical Therapy Treatment Patient Details Name: Laura Carpenter MRN: [id number] DOB: [birth year] Today's Date: 06/20/2022   History of Present Illness Patient is a 87 year old female with acute respiratory failure with hypoxia, A fib with RVR, SDH. Recent fall with CT showing very small extra axial hematoma on left. Repeat scan shows stable blood but small focus of traumatic SAH on right. History of dementia, hypothyroidism, HTN, chronic hyponatremia. 2 falls just prior to admission. Otherwise, family denies falls.    PT Comments    Pt in bed on entry, awake, trunk 45 degrees to long axis of bed, lying on back essentially in fetal position. Pt is calm, interactive, hearing aids not in, so author learns quickly to speak at 9/10 volume. Pt asks for help going to BR. Pt is given opportunities as desired to perform her own pericare, but performance warrants closer supervision, as bowel ends up in places not desired and awareness of such is nil. Pt continues to struggle with obtaining balance. She is impulsive with anxious hand grabbing of fixed objects, this creates secondary falls concerns of author as she sometimes grabs nonloadbearing structures for support, other times, tries to drive the RW with one hand while the other is on something else- these are balance factors that would be very difficult by her husband who uses a 4WW for his own imbalance. NA attends end of session for cleanup and assist back to bed. I think a short rehab stay might offer a chance to optimize balance improvements ro at least return to baseline prior to return to home, which would be difficult from a caregiver standpoint in providing safety support.     Recommendations for follow up therapy are one component of a multi-disciplinary discharge planning process, led by the attending physician.  Recommendations may be updated based on patient status, additional functional criteria and insurance authorization.  Follow Up  Recommendations  Can patient physically be transported by private vehicle: No    Assistance Recommended at Discharge Frequent or constant Supervision/Assistance  Patient can return home with the following A little help with walking and/or transfers;A little help with bathing/dressing/bathroom;Assist for transportation;Help with stairs or ramp for entrance;Assistance with cooking/housework;Direct supervision/assist for medications management   Equipment Recommendations  None recommended by PT (defer to rehab facility; could be safe physical with walker, but unclear if cogntiively would work well)    Recommendations for Other Services       Precautions / Restrictions Precautions Precautions: Fall Restrictions Weight Bearing Restrictions: No     Mobility  Bed Mobility Overal bed mobility: Needs Assistance Bed Mobility: Supine to Sit, Sit to Supine     Supine to sit: Min assist, HOB elevated          Transfers Overall transfer level: Needs assistance Equipment used: 1 person hand held assist Transfers: Sit to/from Stand Sit to Stand: Min assist           General transfer comment: poor DC of posterior lean, takes a very long time to establish balance.    Ambulation/Gait   Gait Distance (Feet): 20 Feet (also does 106ft with RW BR to bedside (RW only partially improves safety issues, but not fully)) Assistive device: 1 person hand held assist Gait Pattern/deviations: Wide base of support, Step-to pattern       General Gait Details: tedious, segmented, falls anxiety; with RW pt still has poor safety awareness; unsafe use of RW which creates new opportunities to sustain LOB.   Stairs  Wheelchair Mobility    Modified Rankin (Stroke Patients Only)       Balance                                            Cognition                                                Exercises      General Comments         Pertinent Vitals/Pain Pain Assessment Pain Assessment: No/denies pain    Home Living                          Prior Function            PT Goals (current goals can now be found in the care plan section) Acute Rehab PT Goals Patient Stated Goal: to go home PT Goal Formulation: With patient Time For Goal Achievement: 07/03/22 Potential to Achieve Goals: Fair Progress towards PT goals: Not progressing toward goals - comment    Frequency    Min 3X/week      PT Plan Discharge plan needs to be updated    Co-evaluation              AM-PAC PT "6 Clicks" Mobility   Outcome Measure  Help needed turning from your back to your side while in a flat bed without using bedrails?: A Lot Help needed moving from lying on your back to sitting on the side of a flat bed without using bedrails?: A Lot Help needed moving to and from a bed to a chair (including a wheelchair)?: A Lot Help needed standing up from a chair using your arms (e.g., wheelchair or bedside chair)?: A Lot Help needed to walk in hospital room?: A Lot Help needed climbing 3-5 steps with a railing? : A Lot 6 Click Score: 12    End of Session Equipment Utilized During Treatment: Gait belt Activity Tolerance: Patient limited by fatigue Patient left: in bed;with call bell/phone within reach;with bed alarm set Nurse Communication: Mobility status PT Visit Diagnosis: Unsteadiness on feet (R26.81);Muscle weakness (generalized) (M62.81)     Time: 1610-9604 PT Time Calculation (min) (ACUTE ONLY): 35 min  Charges:  $Therapeutic Activity: 23-37 mins                    11:30 AM, 06/20/22 Rosamaria Lints, PT, DPT Physical Therapist - [facility]  [phone] (ASCOM)    Laura Carpenter 06/20/2022, 11:25 AM

## 2022-06-20 NOTE — Progress Notes (Signed)
[facility]  - [place] [facility]   PATIENT NAME: Laura Carpenter    MR#:  [id number]  DATE OF BIRTH:  11-30-1935  SUBJECTIVE:  No complaints, eating breakfast, no dyspnea    VITALS:  Blood pressure (!) 138/108, pulse 63, temperature (!) 97.4 F (36.3 C), temperature source Oral, resp. rate 19, height 5\' 7"  (1.702 m), weight 54.4 kg, SpO2 98 %.  PHYSICAL EXAMINATION:   GENERAL:  87 y.o.-year-old patient with no acute distress. thin LUNGS: Coarse breath sounds bilaterally, no wheezing CARDIOVASCULAR: S1, S2 normal. No murmur   ABDOMEN: Soft, nontender, nondistended. Bowel sounds present.  EXTREMITIES: No  edema b/l.    NEUROLOGIC: nonfocal  patient is alert and awake SKIN: No obvious rash, lesion, or ulcer.   LABORATORY PANEL:  CBC Recent Labs  Lab 06/19/22 0451  WBC 5.4  HGB 13.0  HCT 37.8  PLT 166    Chemistries  Recent Labs  Lab 06/18/22 1528 06/18/22 2047 06/19/22 0451  NA 128* 126* 128*  K 4.1 3.4* 4.7  CL 94* 89* 95*  CO2 26 25 25   GLUCOSE 124* 130* 114*  BUN 19 14 18   CREATININE 0.79 0.63 0.79  CALCIUM 9.7 9.3 8.6*  MG  --  1.9  --   AST 56*  --   --   ALT 43  --   --   ALKPHOS 92  --   --   BILITOT 0.7  --   --    Cardiac Enzymes No results for input(s): "TROPONINI" in the last 168 hours. RADIOLOGY:  ECHOCARDIOGRAM COMPLETE  Result Date: 06/19/2022    ECHOCARDIOGRAM REPORT   Patient Name:   Laura Carpenter Date of Exam: 06/19/2022 Medical Rec #:  [id number]    Height:       67.0 in Accession #:    [id number]   Weight:       120.0 lb Date of Birth:  1935/12/31    BSA:          1.628 m Patient Age:    86 years     BP:           112/71 mmHg Patient Gender: F            HR:           75 bpm. Exam Location:  [facility] Procedure: 2D Echo Indications:     Atrial Fibrillation I48.91  History:         Patient has no prior history of Echocardiogram examinations.  Sonographer:     Overton Mam RDCS, FASE Referring Phys:  [id number] BRENDA MORRISON  Diagnosing Phys: Julien Nordmann MD  Sonographer Comments: Technically challenging study due to limited acoustic windows and no apical window. IMPRESSIONS  1. Left ventricular ejection fraction, by estimation, is 60 to 65%. The left ventricle has normal function. The left ventricle has no regional wall motion abnormalities. Left ventricular diastolic parameters are indeterminate.  2. Right ventricular systolic function is normal. The right ventricular size is normal. There is normal pulmonary artery systolic pressure.  3. The mitral valve is normal in structure. Mild mitral valve regurgitation. No evidence of mitral stenosis.  4. The aortic valve is tricuspid. Aortic valve regurgitation is not visualized. No aortic stenosis is present.  5. The inferior vena cava is normal in size with greater than 50% respiratory variability, suggesting right atrial pressure of 3 mmHg. FINDINGS  Left Ventricle: Left ventricular ejection fraction, by estimation, is 60 to 65%. The left  ventricle has normal function. The left ventricle has no regional wall motion abnormalities. The left ventricular internal cavity size was normal in size. There is  no left ventricular hypertrophy. Left ventricular diastolic parameters are indeterminate. Right Ventricle: The right ventricular size is normal. No increase in right ventricular wall thickness. Right ventricular systolic function is normal. There is normal pulmonary artery systolic pressure. The tricuspid regurgitant velocity is 2.61 m/s, and  with an assumed right atrial pressure of 5 mmHg, the estimated right ventricular systolic pressure is 32.2 mmHg. Left Atrium: Left atrial size was normal in size. Right Atrium: Right atrial size was normal in size. Pericardium: There is no evidence of pericardial effusion. Mitral Valve: The mitral valve is normal in structure. Mild mitral valve regurgitation. No evidence of mitral valve stenosis. Tricuspid Valve: The tricuspid valve is normal in  structure. Tricuspid valve regurgitation is mild . No evidence of tricuspid stenosis. Aortic Valve: The aortic valve is tricuspid. Aortic valve regurgitation is not visualized. No aortic stenosis is present. Pulmonic Valve: The pulmonic valve was normal in structure. Pulmonic valve regurgitation is mild. No evidence of pulmonic stenosis. Aorta: The aortic root is normal in size and structure. Venous: The inferior vena cava is normal in size with greater than 50% respiratory variability, suggesting right atrial pressure of 3 mmHg. IAS/Shunts: No atrial level shunt detected by color flow Doppler.  LEFT VENTRICLE PLAX 2D LVIDd:         4.00 cm LVIDs:         2.80 cm LV PW:         1.00 cm LV IVS:        0.80 cm LVOT diam:     1.60 cm LVOT Area:     2.01 cm  LEFT ATRIUM         Index LA diam:    2.80 cm 1.72 cm/m                        PULMONIC VALVE AORTA                 PV Vmax:        0.93 m/s Ao Root diam: 3.00 cm PV Peak grad:   3.5 mmHg                       RVOT Peak grad: 3 mmHg  TRICUSPID VALVE TR Peak grad:   27.2 mmHg TR Vmax:        261.00 cm/s  SHUNTS Systemic Diam: 1.60 cm Julien Nordmann MD Electronically signed by Julien Nordmann MD Signature Date/Time: 06/19/2022/6:36:24 PM    Final    CT HEAD WO CONTRAST ( )  Result Date: 06/18/2022 CLINICAL DATA:  Subdural hematoma EXAM: CT HEAD WITHOUT CONTRAST TECHNIQUE: Contiguous axial images were obtained from the base of the skull through the vertex without intravenous contrast. RADIATION DOSE REDUCTION: This exam was performed according to the departmental dose-optimization program which includes automated exposure control, adjustment of the mA and/or kV according to patient size and/or use of iterative reconstruction technique. COMPARISON:  06/18/2022 at 3:55 p.m. FINDINGS: Brain: Posterior left convexity subdural hematoma is unchanged measuring 6 mm. There is a new focus of subarachnoid hemorrhage measuring approximately 6 mm within the right sylvian  fissure. No midline shift or other mass effect. Vascular: Calcific atherosclerosis of the carotid and vertebral arteries at skull base. Skull: Negative Sinuses/Orbits: Opacification of the right frontal and anterior ethmoid sinuses.  The orbits are normal. Other: None IMPRESSION: 1. Unchanged left convexity subdural hematoma. 2. New focus of subarachnoid hemorrhage within the right Sylvian fissure. Electronically Signed   By: Deatra Robinson M.D.   On: 06/18/2022 22:51   DG Chest Port 1 View  Result Date: 06/18/2022 CLINICAL DATA:  Hypoxia. Tachycardia. EXAM: PORTABLE CHEST 1 VIEW COMPARISON:  Remote chest radiograph 02/12/2006. Lung bases from abdominal CT 04/13/2022 FINDINGS: There is patchy airspace disease in the left greater than right lung base. The heart is normal in size. Aortic atherosclerosis. Biapical pleuroparenchymal scarring. No pleural fluid, pneumothorax, or pulmonary edema. Right chest wall/axillary surgical clips. Left shoulder arthropathy. IMPRESSION: Patchy airspace disease in the left greater than right lung base, suspicious for pneumonia. Electronically Signed   By: Narda Rutherford M.D.   On: 06/18/2022 21:06   DG Shoulder Left  Result Date: 06/18/2022 CLINICAL DATA:  Post fall, now with left shoulder pain. EXAM: LEFT SHOULDER - 2+ VIEW COMPARISON:  None Available. FINDINGS: No fracture or dislocation. Severe degenerative change of the left glenohumeral joint with near complete joint space loss, subchondral sclerosis and osteophytosis. Suspected loose bodies are noted about the superior and inferior aspect of the glenohumeral joint space though discrete donor sites are not identified. Acromioclavicular joint spaces appear preserved. Crescentic calcification about the superior aspect of the left humeral head may represent the sequela of calcific tendinitis versus adhesive capsulitis. Limited visualization of the adjacent thorax demonstrates atherosclerotic plaque within the aortic arch.  Regional soft tissues appear normal. IMPRESSION: 1. No acute findings. 2. Severe degenerative change of the left glenohumeral joint with suspected loose bodies though discrete donor sites are not identified. 3. Eccentric calcifications about the superior aspect of the left humeral head may represent the sequela of calcific tendinitis versus adhesive capsulitis. Electronically Signed   By: Simonne Come M.D.   On: 06/18/2022 16:49   CT HEAD WO CONTRAST ( )  Result Date: 06/18/2022 CLINICAL DATA:  Head trauma, minor (Age >= 65y); Facial trauma, blunt; Neck trauma (Age >= 65y). Fall. EXAM: CT HEAD WITHOUT CONTRAST CT MAXILLOFACIAL WITHOUT CONTRAST CT CERVICAL SPINE WITHOUT CONTRAST TECHNIQUE: Multidetector CT imaging of the head, cervical spine, and maxillofacial structures were performed using the standard protocol without intravenous contrast. Multiplanar CT image reconstructions of the cervical spine and maxillofacial structures were also generated. RADIATION DOSE REDUCTION: This exam was performed according to the departmental dose-optimization program which includes automated exposure control, adjustment of the mA and/or kV according to patient size and/or use of iterative reconstruction technique. COMPARISON:  Head MRI 12/05/2014 FINDINGS: CT HEAD FINDINGS Brain: A small acute subdural hematoma over the left cerebral convexity measures up to 5 mm in thickness without significant mass effect. No acute infarct, mass, or midline shift is identified. There is mild cerebral atrophy. Vascular: Calcified atherosclerosis at the skull base. Skull: No acute fracture or suspicious osseous lesion. Other: None. CT MAXILLOFACIAL FINDINGS Osseous: No acute fracture or mandibular dislocation. Bilateral TMJ arthropathy with condylar flattening. Orbits: Bilateral cataract extraction. Sinuses: Complete opacification of the right frontal sinus and right anterior and mid ethmoid air cells. Clear mastoid air cells. Soft tissues:  Unremarkable. CT CERVICAL SPINE FINDINGS Alignment: Reversal of the normal cervical lordosis. Grade 1 anterolisthesis of C3 on C4, C7 on T1, and T1 on T2. Trace retrolisthesis of C5 on C6. Mild left convex curvature of the cervical spine. Skull base and vertebrae: Moderately advanced median C1-2 arthropathy. No acute fracture or suspicious osseous lesion. Soft tissues and spinal canal: No  prevertebral fluid or swelling. No visible canal hematoma. Disc levels: Diffuse disc degeneration, most advanced at C4-5. Advanced multilevel facet arthrosis. No evidence of high-grade spinal canal stenosis. Mild-to-moderate left neural foraminal stenosis at C5-6. Upper chest: Biapical pleuroparenchymal lung scarring. Other: Moderate atherosclerotic calcification at the carotid bifurcations. Critical Value/emergent results were called by telephone at the time of interpretation on 06/18/2022 at 4:20 pm to Dr. Cyril Loosen, who verbally acknowledged these results. IMPRESSION: 1. Small acute left-sided subdural hematoma without significant mass effect. 2. No acute maxillofacial or cervical spine fracture. Electronically Signed   By: Sebastian Ache M.D.   On: 06/18/2022 16:20   CT Cervical Spine Wo Contrast  Result Date: 06/18/2022 CLINICAL DATA:  Head trauma, minor (Age >= 65y); Facial trauma, blunt; Neck trauma (Age >= 65y). Fall. EXAM: CT HEAD WITHOUT CONTRAST CT MAXILLOFACIAL WITHOUT CONTRAST CT CERVICAL SPINE WITHOUT CONTRAST TECHNIQUE: Multidetector CT imaging of the head, cervical spine, and maxillofacial structures were performed using the standard protocol without intravenous contrast. Multiplanar CT image reconstructions of the cervical spine and maxillofacial structures were also generated. RADIATION DOSE REDUCTION: This exam was performed according to the departmental dose-optimization program which includes automated exposure control, adjustment of the mA and/or kV according to patient size and/or use of iterative  reconstruction technique. COMPARISON:  Head MRI 12/05/2014 FINDINGS: CT HEAD FINDINGS Brain: A small acute subdural hematoma over the left cerebral convexity measures up to 5 mm in thickness without significant mass effect. No acute infarct, mass, or midline shift is identified. There is mild cerebral atrophy. Vascular: Calcified atherosclerosis at the skull base. Skull: No acute fracture or suspicious osseous lesion. Other: None. CT MAXILLOFACIAL FINDINGS Osseous: No acute fracture or mandibular dislocation. Bilateral TMJ arthropathy with condylar flattening. Orbits: Bilateral cataract extraction. Sinuses: Complete opacification of the right frontal sinus and right anterior and mid ethmoid air cells. Clear mastoid air cells. Soft tissues: Unremarkable. CT CERVICAL SPINE FINDINGS Alignment: Reversal of the normal cervical lordosis. Grade 1 anterolisthesis of C3 on C4, C7 on T1, and T1 on T2. Trace retrolisthesis of C5 on C6. Mild left convex curvature of the cervical spine. Skull base and vertebrae: Moderately advanced median C1-2 arthropathy. No acute fracture or suspicious osseous lesion. Soft tissues and spinal canal: No prevertebral fluid or swelling. No visible canal hematoma. Disc levels: Diffuse disc degeneration, most advanced at C4-5. Advanced multilevel facet arthrosis. No evidence of high-grade spinal canal stenosis. Mild-to-moderate left neural foraminal stenosis at C5-6. Upper chest: Biapical pleuroparenchymal lung scarring. Other: Moderate atherosclerotic calcification at the carotid bifurcations. Critical Value/emergent results were called by telephone at the time of interpretation on 06/18/2022 at 4:20 pm to Dr. Cyril Loosen, who verbally acknowledged these results. IMPRESSION: 1. Small acute left-sided subdural hematoma without significant mass effect. 2. No acute maxillofacial or cervical spine fracture. Electronically Signed   By: Sebastian Ache M.D.   On: 06/18/2022 16:20   CT Maxillofacial Wo  Contrast  Result Date: 06/18/2022 CLINICAL DATA:  Head trauma, minor (Age >= 65y); Facial trauma, blunt; Neck trauma (Age >= 65y). Fall. EXAM: CT HEAD WITHOUT CONTRAST CT MAXILLOFACIAL WITHOUT CONTRAST CT CERVICAL SPINE WITHOUT CONTRAST TECHNIQUE: Multidetector CT imaging of the head, cervical spine, and maxillofacial structures were performed using the standard protocol without intravenous contrast. Multiplanar CT image reconstructions of the cervical spine and maxillofacial structures were also generated. RADIATION DOSE REDUCTION: This exam was performed according to the departmental dose-optimization program which includes automated exposure control, adjustment of the mA and/or kV according to patient size and/or  use of iterative reconstruction technique. COMPARISON:  Head MRI 12/05/2014 FINDINGS: CT HEAD FINDINGS Brain: A small acute subdural hematoma over the left cerebral convexity measures up to 5 mm in thickness without significant mass effect. No acute infarct, mass, or midline shift is identified. There is mild cerebral atrophy. Vascular: Calcified atherosclerosis at the skull base. Skull: No acute fracture or suspicious osseous lesion. Other: None. CT MAXILLOFACIAL FINDINGS Osseous: No acute fracture or mandibular dislocation. Bilateral TMJ arthropathy with condylar flattening. Orbits: Bilateral cataract extraction. Sinuses: Complete opacification of the right frontal sinus and right anterior and mid ethmoid air cells. Clear mastoid air cells. Soft tissues: Unremarkable. CT CERVICAL SPINE FINDINGS Alignment: Reversal of the normal cervical lordosis. Grade 1 anterolisthesis of C3 on C4, C7 on T1, and T1 on T2. Trace retrolisthesis of C5 on C6. Mild left convex curvature of the cervical spine. Skull base and vertebrae: Moderately advanced median C1-2 arthropathy. No acute fracture or suspicious osseous lesion. Soft tissues and spinal canal: No prevertebral fluid or swelling. No visible canal hematoma.  Disc levels: Diffuse disc degeneration, most advanced at C4-5. Advanced multilevel facet arthrosis. No evidence of high-grade spinal canal stenosis. Mild-to-moderate left neural foraminal stenosis at C5-6. Upper chest: Biapical pleuroparenchymal lung scarring. Other: Moderate atherosclerotic calcification at the carotid bifurcations. Critical Value/emergent results were called by telephone at the time of interpretation on 06/18/2022 at 4:20 pm to Dr. Cyril Loosen, who verbally acknowledged these results. IMPRESSION: 1. Small acute left-sided subdural hematoma without significant mass effect. 2. No acute maxillofacial or cervical spine fracture. Electronically Signed   By: Sebastian Ache M.D.   On: 06/18/2022 16:20    Assessment and Plan  Laura Carpenter is a 87 y.o. female with medical history significant of Alzheimer's dementia, chronic hyponatremia, hypertension, hyperlipidemia, hypothyroidism, fibromuscular dysplasia of the renal artery, who presents to the ED due to a ground-level fall   Subdural hematoma subarachnoid hemorrhage post mechanical fall times two -- patient seen by neurosurgery Dr. Adriana Simas. Stable. Hold aspirin 1 week. F/u clinic 2-3 wks  Hyponatremia  Apears to be siadh likely from mirtaz will d/c that - f/u cortisol - mirtaz on hold  Respiratory/viral infection metapneumovirus Mild, no o2, procal neg - will d/c yesterday's "empiric" antibiotics - monitor  Left shoulder pain worse than after fall -- left shoulder x-ray no traumatic injury. Shows significant degenerative arthritis and questionable capsulitis/adhesivitis  Hypothyroidism Tsh wnl -- continue Synthroid  Dementia With some sundowning here - delirium precautions  HTN Elevated overnight - will increase amlod to 5, cont benaz 10   Procedures: Family communication : daughter telephonically 5/27 Consults : neurosurgery Dr. Adriana Simas CODE STATUS: DNR DVT Prophylaxis : SCD Level of care: Med-Surg Status is: inpt  TOTAL  TIME TAKING CARE OF THIS PATIENT: 35 minutes.  >50% time spent on counselling and coordination of care    Silvano Bilis M.D    [facility]   CC: Primary care physician; Dana Allan, MD

## 2022-06-21 ENCOUNTER — Other Ambulatory Visit: Payer: Self-pay | Admitting: Neurosurgery

## 2022-06-21 DIAGNOSIS — S065XAA Traumatic subdural hemorrhage with loss of consciousness status unknown, initial encounter: Secondary | ICD-10-CM | POA: Diagnosis not present

## 2022-06-21 DIAGNOSIS — I609 Nontraumatic subarachnoid hemorrhage, unspecified: Secondary | ICD-10-CM

## 2022-06-21 LAB — CORTISOL: Cortisol, Plasma: 27.7 ug/dL

## 2022-06-21 LAB — OSMOLALITY, URINE: Osmolality, Ur: 603 mOsm/kg (ref 300–900)

## 2022-06-21 LAB — BASIC METABOLIC PANEL
Anion gap: 10 (ref 5–15)
BUN: 16 mg/dL (ref 8–23)
CO2: 26 mmol/L (ref 22–32)
Calcium: 9.3 mg/dL (ref 8.9–10.3)
Chloride: 87 mmol/L — ABNORMAL LOW (ref 98–111)
Creatinine, Ser: 0.63 mg/dL (ref 0.44–1.00)
GFR, Estimated: >60 mL/min (ref >60)
Glucose, Bld: 121 mg/dL — ABNORMAL HIGH (ref 70–99)
Potassium: 4.2 mmol/L (ref 3.5–5.1)
Sodium: 123 mmol/L — ABNORMAL LOW (ref 135–145)

## 2022-06-21 LAB — LEGIONELLA PNEUMOPHILA SEROGP 1 UR AG: L. pneumophila Serogp 1 Ur Ag: NEGATIVE

## 2022-06-21 LAB — SODIUM
Sodium: 123 mmol/L — ABNORMAL LOW (ref 135–145)
Sodium: 125 mmol/L — ABNORMAL LOW (ref 135–145)
Sodium: 126 mmol/L — ABNORMAL LOW (ref 135–145)

## 2022-06-21 LAB — CORTISOL-AM, BLOOD: Cortisol - AM: 24.6 ug/dL — ABNORMAL HIGH (ref 6.7–22.6)

## 2022-06-21 LAB — MRSA NEXT GEN BY PCR, NASAL: MRSA by PCR Next Gen: NOT DETECTED

## 2022-06-21 LAB — SODIUM, URINE, RANDOM: Sodium, Ur: 60 mmol/L

## 2022-06-21 LAB — GLUCOSE, CAPILLARY: Glucose-Capillary: 105 mg/dL — ABNORMAL HIGH (ref 70–99)

## 2022-06-21 LAB — OSMOLALITY: Osmolality: 268 mOsm/kg — ABNORMAL LOW (ref 275–295)

## 2022-06-21 MED ORDER — SODIUM CHLORIDE 3 % IV SOLN
INTRAVENOUS | Status: DC
  Filled 2022-06-21 (×2): qty 500

## 2022-06-21 MED ORDER — CHLORHEXIDINE GLUCONATE CLOTH 2 % EX PADS
6.0000 | MEDICATED_PAD | Freq: Every day | CUTANEOUS | Status: DC
  Administered 2022-06-21 – 2022-06-23 (×2): 6 via TOPICAL

## 2022-06-21 MED ORDER — SODIUM CHLORIDE 1 G PO TABS
3.0000 g | ORAL_TABLET | Freq: Three times a day (TID) | ORAL | Status: DC
  Administered 2022-06-21: 3 g via ORAL
  Filled 2022-06-21 (×4): qty 3

## 2022-06-21 NOTE — Progress Notes (Signed)
Patient fitted with a small left subdural hematoma and traumatic subarachnoid hemorrhage on the right.  Admitted for hyponatremia and pulmonary issues with a history of dementia.  Needs follow-up in 2 to 3 weeks with head CT prior.  Head CT ordered and follow-up placed in patient's discharge paperwork.

## 2022-06-21 NOTE — Progress Notes (Addendum)
[facility]  - [place] [facility]   PATIENT NAME: Laura Carpenter    MR#:  [id number]  DATE OF BIRTH:  12/04/1935  SUBJECTIVE:  No complaints, eating breakfast    VITALS:  Blood pressure (!) 138/90, pulse 74, temperature 98 F (36.7 C), temperature source Oral, resp. rate 16, height 5\' 7"  (1.702 m), weight 54.4 kg, SpO2 95 %.  PHYSICAL EXAMINATION:   GENERAL:  87 y.o.-year-old patient with no acute distress. thin LUNGS: Coarse breath sounds bilaterally, no wheezing CARDIOVASCULAR: S1, S2 normal. No murmur   ABDOMEN: Soft, nontender, nondistended. Bowel sounds present.  EXTREMITIES: No  edema b/l.    NEUROLOGIC: nonfocal  patient is alert and awake SKIN: bruise around left eye  LABORATORY PANEL:  CBC Recent Labs  Lab 06/19/22 0451  WBC 5.4  HGB 13.0  HCT 37.8  PLT 166    Chemistries  Recent Labs  Lab 06/18/22 1528 06/18/22 2047 06/19/22 0451 06/21/22 0455  NA 128* 126*   < > 123*  K 4.1 3.4*   < > 4.2  CL 94* 89*   < > 87*  CO2 26 25   < > 26  GLUCOSE 124* 130*   < > 121*  BUN 19 14   < > 16  CREATININE 0.79 0.63   < > 0.63  CALCIUM 9.7 9.3   < > 9.3  MG  --  1.9  --   --   AST 56*  --   --   --   ALT 43  --   --   --   ALKPHOS 92  --   --   --   BILITOT 0.7  --   --   --    < > = values in this interval not displayed.   Cardiac Enzymes No results for input(s): "TROPONINI" in the last 168 hours. RADIOLOGY:  ECHOCARDIOGRAM COMPLETE  Result Date: 06/19/2022    ECHOCARDIOGRAM REPORT   Patient Name:   Laura Carpenter Date of Exam: 06/19/2022 Medical Rec #:  [id number]    Height:       67.0 in Accession #:    [id number]   Weight:       120.0 lb Date of Birth:  November 06, 1935    BSA:          1.628 m Patient Age:    86 years     BP:           112/71 mmHg Patient Gender: F            HR:           75 bpm. Exam Location:  [facility] Procedure: 2D Echo Indications:     Atrial Fibrillation I48.91  History:         Patient has no prior history of Echocardiogram  examinations.  Sonographer:     Overton Mam RDCS, FASE Referring Phys:  [id number] BRENDA MORRISON Diagnosing Phys: Julien Nordmann MD  Sonographer Comments: Technically challenging study due to limited acoustic windows and no apical window. IMPRESSIONS  1. Left ventricular ejection fraction, by estimation, is 60 to 65%. The left ventricle has normal function. The left ventricle has no regional wall motion abnormalities. Left ventricular diastolic parameters are indeterminate.  2. Right ventricular systolic function is normal. The right ventricular size is normal. There is normal pulmonary artery systolic pressure.  3. The mitral valve is normal in structure. Mild mitral valve regurgitation. No evidence of mitral stenosis.  4. The  aortic valve is tricuspid. Aortic valve regurgitation is not visualized. No aortic stenosis is present.  5. The inferior vena cava is normal in size with greater than 50% respiratory variability, suggesting right atrial pressure of 3 mmHg. FINDINGS  Left Ventricle: Left ventricular ejection fraction, by estimation, is 60 to 65%. The left ventricle has normal function. The left ventricle has no regional wall motion abnormalities. The left ventricular internal cavity size was normal in size. There is  no left ventricular hypertrophy. Left ventricular diastolic parameters are indeterminate. Right Ventricle: The right ventricular size is normal. No increase in right ventricular wall thickness. Right ventricular systolic function is normal. There is normal pulmonary artery systolic pressure. The tricuspid regurgitant velocity is 2.61 m/s, and  with an assumed right atrial pressure of 5 mmHg, the estimated right ventricular systolic pressure is 32.2 mmHg. Left Atrium: Left atrial size was normal in size. Right Atrium: Right atrial size was normal in size. Pericardium: There is no evidence of pericardial effusion. Mitral Valve: The mitral valve is normal in structure. Mild mitral valve  regurgitation. No evidence of mitral valve stenosis. Tricuspid Valve: The tricuspid valve is normal in structure. Tricuspid valve regurgitation is mild . No evidence of tricuspid stenosis. Aortic Valve: The aortic valve is tricuspid. Aortic valve regurgitation is not visualized. No aortic stenosis is present. Pulmonic Valve: The pulmonic valve was normal in structure. Pulmonic valve regurgitation is mild. No evidence of pulmonic stenosis. Aorta: The aortic root is normal in size and structure. Venous: The inferior vena cava is normal in size with greater than 50% respiratory variability, suggesting right atrial pressure of 3 mmHg. IAS/Shunts: No atrial level shunt detected by color flow Doppler.  LEFT VENTRICLE PLAX 2D LVIDd:         4.00 cm LVIDs:         2.80 cm LV PW:         1.00 cm LV IVS:        0.80 cm LVOT diam:     1.60 cm LVOT Area:     2.01 cm  LEFT ATRIUM         Index LA diam:    2.80 cm 1.72 cm/m                        PULMONIC VALVE AORTA                 PV Vmax:        0.93 m/s Ao Root diam: 3.00 cm PV Peak grad:   3.5 mmHg                       RVOT Peak grad: 3 mmHg  TRICUSPID VALVE TR Peak grad:   27.2 mmHg TR Vmax:        261.00 cm/s  SHUNTS Systemic Diam: 1.60 cm Julien Nordmann MD Electronically signed by Julien Nordmann MD Signature Date/Time: 06/19/2022/6:36:24 PM    Final     Assessment and Plan  Laura Carpenter is a 87 y.o. female with medical history significant of Alzheimer's dementia, chronic hyponatremia, hypertension, hyperlipidemia, hypothyroidism, fibromuscular dysplasia of the renal artery, who presents to the ED due to a ground-level fall   Subdural hematoma subarachnoid hemorrhage post mechanical fall times two -- patient seen by neurosurgery Dr. Adriana Simas. Stable. Hold aspirin 1 week. F/u clinic 2-3 wks  Hyponatremia  Down to 123 today. Labs consistent w/ siadh. Probably consequence of subarachnoid.  -  maintain euvolemia and systolic less than 160 - have started oral salt  tablets 3 g tid - nephrology consulted for consideration of hypertonic saline - bid sodium level - mirtazapine on hold  Respiratory/viral infection metapneumovirus Mild, no o2, procal neg - monitor  Acute urinary retention Today unable to void and bladder scan shows 400 ml - I/o cath now and prn, will order bladder scans q shift  Left shoulder pain worse than after fall -- left shoulder x-ray no traumatic injury. Shows significant degenerative arthritis and questionable capsulitis/adhesivitis  Hypothyroidism Tsh wnl -- continue Synthroid  Dementia With some sundowning here - delirium precautions  HTN Mild elevation this morning - cont amlod to 5, cont benaz 10  Debility PT/OT advising SNF, TOC is working on this   Procedures: Family communication : daughter telephonically 5/27, message left 5/28 Consults : neurosurgery Dr. Adriana Simas CODE STATUS: DNR DVT Prophylaxis : lovenox Level of care: Med-Surg Status is: inpt  TOTAL TIME TAKING CARE OF THIS PATIENT: 35 minutes.     Silvano Bilis M.D    [facility]   CC: Primary care physician; Dana Allan, MD

## 2022-06-21 NOTE — Progress Notes (Signed)
Arrived to patient room 112. Patient currently being transferred to I CU. Will f/u with ICU to confirm need. Tomasita Morrow, RN VAST

## 2022-06-21 NOTE — TOC Progression Note (Addendum)
Transition of Care [facility]) - Progression Note    Patient Details  Name: Laura Carpenter MRN: [id number] Date of Birth: 11-Jul-1935  Transition of Care [facility]) CM/SW Contact  Allena Katz, LCSW Phone Number: 06/21/2022, 9:46 AM  Clinical Narrative:     Referral sent to [facility] as patient is in the process of becoming a resident there per her daughter Almyra Free.  Sue Lush with [facility] is going to review referral but states if able to accept wouldn't be able to until tomorrow.   2:40pm  CSW updated daughter on plans for [facility] to review.  Expected Discharge Plan: Home w Home Health Services Barriers to Discharge: Continued Medical Work up  Expected Discharge Plan and Services       Living arrangements for the past 2 months: Single Family Home                                       Social Determinants of Health (SDOH) Interventions SDOH Screenings   Depression (PHQ2-9): Low Risk  (02/22/2022)  Tobacco Use: Medium Risk (06/18/2022)    Readmission Risk Interventions     No data to display

## 2022-06-21 NOTE — Progress Notes (Signed)
Assessment: Pharmacy has been consulted for concentrated sodium chloride monitoring.   05/28 1350 Na 123 0528 ?    3% saline started  Goal of Therapy:  Increase Na by 4-6 mEq/L in 4-6 hours  Plan:  Start 3% sodium chloride at 30 mL/hr by nephrology Monitor sodium every 2 hours x2 occurences, followed by every 4 hours Notify MD if Na increases by 4 mEq/L or more in the first 2 hours, or Na increases by 6 mEq/L or more in the first 4 hours  Bari Mantis PharmD Clinical Pharmacist 06/21/2022

## 2022-06-21 NOTE — Progress Notes (Addendum)
1550 Received patient from the floor via bed. Moved to ICU bed. Bruises noted on left face,arm and leg from pre-admission fall. Knows her name and where she is. Confused on month,day and year or reson for admision. Knows she fell at home. 1605 IV team in to start ultrasound guided IV for 3% saline solution. 1723 3% saline started per orders. Iwatch placed over site.  1800 Visitor in to visit.

## 2022-06-21 NOTE — Consult Note (Signed)
[facility]  CONSULT NOTE    Date: 06/21/2022                  Patient Name:  Laura Carpenter  MRN: [id number]  DOB: May 12, 1935  Age / Sex: 87 y.o., female         PCP: Dana Allan, MD                 Service Requesting Consult: TRH                 Reason for Consult: Hyponatremia            History of Present Illness: Laura Carpenter is a 87 y.o.  female with past medical history of chronic hyponatremia, hypothyroidism, hyperlipidemia,  and fibromuscular dysplasia, who was admitted to [facility] on 06/18/2022 for Hyponatremia [E87.1] Subdural hematoma (HCC) [S06.5XAA] SDH (subdural hematoma) (HCC) [S06.5XAA] Fall in home, initial encounter [W19.Lorne Skeens, Y92.009]  Patient presents to the ED after suffering a couple falls at home. It was reported that she fell in her drive way, face first. The second fall occurred in the restroom with head injury, no loss of consciousness. Family reports confusion with left arm pain.  Labs on ED arrival include sodium 128, glucose 124.  UA appears cloudy with some leukocytes and bacteria.  Respiratory panel positive for metapneumovirus.  Head CT without contrast shows left-sided subdural hematoma and subarachnoid hemorrhage. Sodium has decreased to 123.  Medications: Outpatient medications: Medications Prior to Admission  Medication Sig Dispense Refill Last Dose   amlodipine-benazepril (LOTREL) 2.5-10 MG capsule TAKE 1 CAPSULE BY MOUTH ONCE DAILY 90 capsule 3 06/18/2022   aspirin 81 MG tablet Take 81 mg by mouth daily.   06/18/2022   atorvastatin (LIPITOR) 10 MG tablet TAKE ONE TABLET BY MOUTH EVERY DAY AT 6PM 90 tablet 3 06/17/2022   Cholecalciferol (VITAMIN D3) 50 MCG (2000 UT) TABS Take 4,000 Units by mouth daily.   06/18/2022   levothyroxine (SYNTHROID) 125 MCG tablet Take 1 tablet (125 mcg total) by mouth daily before breakfast. 90 tablet 3 06/18/2022   mirtazapine (REMERON) 7.5 MG tablet Take 7.5 mg by mouth at bedtime. Take 1 tablet  daily   06/17/2022   rivastigmine (EXELON) 9.5 mg/24hr    06/17/2022   vitamin B-12 (CYANOCOBALAMIN) 1000 MCG tablet Take by mouth.   06/18/2022   amLODipine (NORVASC) 2.5 MG tablet Take 2.5 mg by mouth daily. (Patient not taking: Reported on 06/18/2022)   Not Taking    Current medications: Current Facility-Administered Medications  Medication Dose Route Frequency Provider Last Rate Last Admin   acetaminophen (TYLENOL) tablet 650 mg  650 mg Oral Q6H PRN Verdene Lennert, MD   650 mg at 06/18/22 2203   Or   acetaminophen (TYLENOL) suppository 650 mg  650 mg Rectal Q6H PRN Verdene Lennert, MD       amLODipine (NORVASC) tablet 5 mg  5 mg Oral Daily Kathrynn Running, MD   5 mg at 06/21/22 4098   And   benazepril (LOTENSIN) tablet 10 mg  10 mg Oral Daily Kathrynn Running, MD   10 mg at 06/21/22 1191   atorvastatin (LIPITOR) tablet 10 mg  10 mg Oral Daily Verdene Lennert, MD   10 mg at 06/21/22 4782   Chlorhexidine Gluconate Cloth 2 % PADS 6 each  6 each Topical Daily Kathrynn Running, MD   6 each at 06/21/22 1557   cyanocobalamin (VITAMIN B12) tablet 1,000 mcg  1,000 mcg Oral Daily Verdene Lennert, MD   1,000 mcg at 06/21/22 0917   enoxaparin (LOVENOX) injection 40 mg  40 mg Subcutaneous Q24H Kathrynn Running, MD   40 mg at 06/20/22 2014   feeding supplement (ENSURE ENLIVE / ENSURE PLUS) liquid 237 mL  237 mL Oral BID BM Enedina Finner, MD   237 mL at 06/21/22 2130   folic acid (FOLVITE) tablet 1 mg  1 mg Oral Daily Verdene Lennert, MD   1 mg at 06/21/22 0917   labetalol (NORMODYNE) injection 10 mg  10 mg Intravenous Q2H PRN Manuela Schwartz, NP   10 mg at 06/19/22 2228   levothyroxine (SYNTHROID) tablet 125 mcg  125 mcg Oral Q0600 Verdene Lennert, MD   125 mcg at 06/21/22 0540   multivitamin with minerals tablet 1 tablet  1 tablet Oral Daily Verdene Lennert, MD   1 tablet at 06/20/22 1225   ondansetron (ZOFRAN) tablet 4 mg  4 mg Oral Q6H PRN Verdene Lennert, MD       Or   ondansetron (ZOFRAN)  injection 4 mg  4 mg Intravenous Q6H PRN Verdene Lennert, MD       rivastigmine (EXELON) 9.5 mg/24hr 9.5 mg  9.5 mg Transdermal Daily Verdene Lennert, MD   9.5 mg at 06/21/22 8657   sodium chloride (hypertonic) 3 % solution   Intravenous Continuous , , NP       sodium chloride flush (NS) 0.9 % injection 3 mL  3 mL Intravenous Q12H Verdene Lennert, MD   3 mL at 06/21/22 0919   sodium chloride tablet 3 g  3 g Oral TID WC Wouk, Wilfred Curtis, MD   3 g at 06/21/22 8469   thiamine (VITAMIN B1) tablet 100 mg  100 mg Oral Daily Verdene Lennert, MD   100 mg at 06/21/22 6295      Allergies: Allergies  Allergen Reactions   Aricept [Donepezil Hcl]     GI upset       Past Medical History: Past Medical History:  Diagnosis Date   Actinic keratosis 05/08/2019   Breast cancer (HCC)    02/17/10: right lumpectomy reveals 0.8 cm of invasive ductal carcinoma with 1/3 LN+ for isolated tumor cell cluster in Oncotype recurrent score 19 or 12% chance of distant recurrence; Femara self-discontinued secondary to side effects. She continues to decline further therapy   Chronic hyponatremia    Hyponatremia for which she has had since somewhere in the mid 1990's. She's had multiple hospitalization, one hospitalization in 1993, she was diagnosed with an EF of 25% as well, though coronaries were clean during that hospitalization. The cath showed an EF of 50%. She is being worked up for her hyponatremia at [facility].   Chronic hyponatremia    Hyponatremia for which she has had since somewhere in the mid 1990's. She's had multiple hospitalization, one hospitalization in 1993, she was diagnosed with an EF of 25% as well, though coronaries were clean during that hospitalization. The cath showed an EF of 50%. She is being worked up for her hyponatremia at [facility].   Dehydration 06/29/2015   Dementia (HCC)    Dizziness    Dizziness 12/26/2011   Excessive cerumen in both ear canals 06/09/2020   Fall at home 09/24/2015    Fibromuscular dysplasia of renal artery [facility])    1997 [facility] at [facility]. She subsequently underwent repeat angioplasty a few years later. 12/2009 Abdominal Duplex: <60% bilateral RAS, Sequential narrowing and dilations of renal arteries, suggests fibromuscular  dysplasia.    H/O electrolyte imbalance    Hearing loss    Hemorrhoid    History of radiation therapy 2011   Hypertension    Hyponatremia 06/28/2018   Hypothyroidism    Ingrowing nail, left great toe    Ingrowing nail, left great toe 03/25/2014   Non-melanoma skin cancer    right hand   OSA on CPAP    Otalgia of right ear 11/10/2011   Otalgia of right ear 11/10/2011   Polycythemia    Right ankle pain 10/28/2020   Senile dementia of Alzheimer's type (HCC) 06/29/2018   Established [facility]   Skin cancer    squamous cell skin cancer removed by Dermatology--arms and hands   Skin lesion of left leg 05/29/2012   Sleep apnea    Uterine fibroid 06/28/2018   UTI (urinary tract infection)    Vertigo 12/26/2011     Past Surgical History: Past Surgical History:  Procedure Laterality Date   ANGIOPLASTY OF RIGHT RENAL ARTERY  05/04/1995   APPENDECTOMY     BREAST LUMPECTOMY Right 2012   performed [facility]    core biopsy breast Right 2012   ultrasound guided core needle biopsy of the right breast reveals invasive ductal carcinoma, ER/PR positive, HER2 neu    FRACTURE SURGERY  2010   right     Family History: Family History  Problem Relation Age of Onset   Hypertension Mother    Cancer Mother        ? type lived 103    Alcohol abuse Father        died 23    Cirrhosis Father    Hypertension Daughter        Almyra Free   Cirrhosis Brother        a lot of health problems died 69s-70s    Hyperlipidemia Son      Social History: Social History   Socioeconomic History   Marital status: Married    Spouse name: Not on file   Number of children: Not on file   Years of education: Not on file   Highest  education level: Not on file  Occupational History   Not on file  Tobacco Use   Smoking status: Former    Packs/day: 0.25    Years: 20.00    Additional pack years: 0.00    Total pack years: 5.00    Types: Cigarettes   Smokeless tobacco: Former    Quit date: 05/30/1976   Tobacco comments:    early teens; early college smoking Quit: 05/30/1976  Substance and Sexual Activity   Alcohol use: Yes    Alcohol/week: 0.0 standard drinks of alcohol    Comment: occasional social drinker   Drug use: No   Sexual activity: Yes    Partners: Male  Other Topics Concern   Not on file  Social History Narrative   Married x 59 years as of 09/2018    3 kids 1 daughter and 2 sons 5 grands    Homemaker    DPR   1. Husband John [phone]; [phone]   2. Libby [phone]) [phone]    From [place]   Social Determinants of Health   Financial Resource Strain: Not on file  Food Insecurity: Not on file  Transportation Needs: Not on file  Physical Activity: Not on file  Stress: Not on file  Social Connections: Not on file  Intimate Partner Violence: Not on file     Review  of Systems: Review of Systems  Constitutional:  Negative for chills, fever and malaise/fatigue.  HENT:  Negative for congestion, sore throat and tinnitus.   Eyes:  Negative for blurred vision and redness.  Respiratory:  Negative for cough, shortness of breath and wheezing.   Cardiovascular:  Negative for chest pain, palpitations, claudication and leg swelling.  Gastrointestinal:  Negative for abdominal pain, blood in stool, diarrhea, nausea and vomiting.  Genitourinary:  Negative for flank pain, frequency and hematuria.  Musculoskeletal:  Positive for falls. Negative for back pain and myalgias.  Skin:  Negative for rash.  Neurological:  Negative for dizziness, weakness and headaches.  Endo/Heme/Allergies:  Does not bruise/bleed easily.  Psychiatric/Behavioral:  Negative for depression. The patient is not nervous/anxious  and does not have insomnia.     Vital Signs: Blood pressure (!) 138/90, pulse 74, temperature 98 F (36.7 C), temperature source Oral, resp. rate 16, height 5\' 7"  (1.702 m), weight 54.4 kg, SpO2 95 %.  Weight trends: Filed Weights   06/18/22 1442  Weight: 54.4 kg    Physical Exam: General: NAD  Head: Normocephalic, atraumatic. Moist oral mucosal membranes  Eyes: Anicteric  Lungs:  Clear to auscultation, normal effort  Heart: Regular rate and rhythm  Abdomen:  Soft, nontender  Extremities:  No peripheral edema.  Neurologic: Alert and oriented to self, moving all four extremities  Skin: No lesions  Access: None     Lab results: Basic Metabolic Panel: Recent Labs  Lab 06/18/22 2047 06/19/22 0451 06/20/22 1007 06/21/22 0455 06/21/22 1350  NA 126* 128* 123* 123* 123*  K 3.4* 4.7 3.5 4.2  --   CL 89* 95* 85* 87*  --   CO2 25 25 28 26   --   GLUCOSE 130* 114* 163* 121*  --   BUN 14 18 13 16   --   CREATININE 0.63 0.79 0.60 0.63  --   CALCIUM 9.3 8.6* 9.5 9.3  --   MG 1.9  --   --   --   --     Liver Function Tests: Recent Labs  Lab 06/18/22 1528  AST 56*  ALT 43  ALKPHOS 92  BILITOT 0.7  PROT 7.5  ALBUMIN 4.4   No results for input(s): "LIPASE", "AMYLASE" in the last 168 hours. No results for input(s): "AMMONIA" in the last 168 hours.  CBC: Recent Labs  Lab 06/18/22 1528 06/19/22 0451  WBC 5.2 5.4  NEUTROABS 4.0  --   HGB 14.3 13.0  HCT 41.9 37.8  MCV 93.7 93.6  PLT 184 166    Cardiac Enzymes: No results for input(s): "CKTOTAL", "CKMB", "CKMBINDEX", "TROPONINI" in the last 168 hours.  BNP: Invalid input(s): "POCBNP"  CBG: Recent Labs  Lab 06/21/22 1552  GLUCAP 105*    Microbiology: Results for orders placed or performed during the hospital encounter of 06/18/22  Respiratory (~20 pathogens) panel by PCR     Status: Abnormal   Collection Time: 06/18/22  8:45 PM   Specimen: Nasopharyngeal Swab; Respiratory  Result Value Ref Range Status    Adenovirus NOT DETECTED NOT DETECTED Final   Coronavirus 229E NOT DETECTED NOT DETECTED Final    Comment: (NOTE) The Coronavirus on the Respiratory Panel, DOES NOT test for the novel  Coronavirus (2019 nCoV)    Coronavirus HKU1 NOT DETECTED NOT DETECTED Final   Coronavirus NL63 NOT DETECTED NOT DETECTED Final   Coronavirus OC43 NOT DETECTED NOT DETECTED Final   Metapneumovirus DETECTED (A) NOT DETECTED Final   Rhinovirus /  Enterovirus NOT DETECTED NOT DETECTED Final   Influenza A NOT DETECTED NOT DETECTED Final   Influenza B NOT DETECTED NOT DETECTED Final   Parainfluenza Virus 1 NOT DETECTED NOT DETECTED Final   Parainfluenza Virus 2 NOT DETECTED NOT DETECTED Final   Parainfluenza Virus 3 NOT DETECTED NOT DETECTED Final   Parainfluenza Virus 4 NOT DETECTED NOT DETECTED Final   Respiratory Syncytial Virus NOT DETECTED NOT DETECTED Final   Bordetella pertussis NOT DETECTED NOT DETECTED Final   Bordetella Parapertussis NOT DETECTED NOT DETECTED Final   Chlamydophila pneumoniae NOT DETECTED NOT DETECTED Final   Mycoplasma pneumoniae NOT DETECTED NOT DETECTED Final    Comment: Performed at [facility], [address] [place], [place] [address]    Coagulation Studies: No results for input(s): "LABPROT", "INR" in the last 72 hours.  Urinalysis: No results for input(s): "COLORURINE", "LABSPEC", "PHURINE", "GLUCOSEU", "HGBUR", "BILIRUBINUR", "KETONESUR", "PROTEINUR", "UROBILINOGEN", "NITRITE", "LEUKOCYTESUR" in the last 72 hours.  Invalid input(s): "APPERANCEUR"    Imaging: No results found.   Assessment & Plan: Laura Carpenter is a 87 y.o.  female with past medical history of chronic hyponatremia, hypothyroidism, hyperlipidemia,  and fibromuscular dysplasia, who was admitted to [facility] on 06/18/2022 for Hyponatremia [E87.1] Subdural hematoma (HCC) [S06.5XAA] SDH (subdural hematoma) (HCC) [S06.5XAA] Fall in home, initial encounter [W19.XXXA, Y92.009]  Acute on chronic  hyponatremia likely secondary to subdural and subarachnoid hematoma. Baseline sodium 133-136. Sodium 128 on admission and now 123. Salt tabs ordered by primary team. Will order 3% hypertonic saline at 69ml/hr. Goal correction of 8-10 in 24 hours.     LOS: 1   5/28/20243:58 PM

## 2022-06-21 NOTE — [place] FL2 (Signed)
[place] MEDICAID FL2 LEVEL OF CARE FORM     IDENTIFICATION  Patient Name: Laura Carpenter Birthdate: 11/03/1935 Sex: female Admission Date (Current Location): 06/18/2022  [facility] and [place] Number:  Chiropodist and Address:  [facility], [address], [place], [place] [address]      Provider Number: [id number]  Attending Physician Name and Address:  Kathrynn Running, MD  Relative Name and Phone Number:  Gucciardo,Libby (Daughter) [phone]    Current Level of Care: Hospital Recommended Level of Care: Skilled Nursing Facility Prior Approval Number:    Date Approved/Denied: 06/21/22 PASRR Number: [id number]  Discharge Plan: SNF    Current Diagnoses: Patient Active Problem List   Diagnosis Date Noted   Protein-calorie malnutrition, severe 06/20/2022   Subdural hematoma (HCC) 06/18/2022   Altered mental status 06/18/2022   Left shoulder pain 06/18/2022   Elevated LFTs 02/20/2022   Physical deconditioning 01/03/2022   Breast nodule 11/11/2021   Weight loss 12/09/2020   Balance problem 10/28/2020   Hyponatremia 06/28/2018   HLD (hyperlipidemia) 06/28/2018   History of breast cancer 06/28/2018   B12 deficiency 06/28/2018   Skin cancer    Hypothyroidism    Hypertension    Sleep apnea    Fibromuscular dysplasia of renal artery (HCC)    Dementia (HCC)    Secondary polycythemia 10/17/2014   Hearing loss 03/23/2012    Orientation RESPIRATION BLADDER Height & Weight        Normal Incontinent, External catheter Weight: 120 lb (54.4 kg) Height:  5\' 7"  (170.2 cm)  BEHAVIORAL SYMPTOMS/MOOD NEUROLOGICAL BOWEL NUTRITION STATUS      Incontinent Diet  AMBULATORY STATUS COMMUNICATION OF NEEDS Skin   Limited Assist Verbally Normal                       Personal Care Assistance Level of Assistance  Bathing, Feeding, Dressing Bathing Assistance: Limited assistance Feeding assistance: Limited assistance Dressing Assistance:  Limited assistance     Functional Limitations Info  Sight, Hearing, Speech Sight Info: Impaired Hearing Info: Impaired Speech Info: Adequate    SPECIAL CARE FACTORS FREQUENCY  PT (By licensed PT), OT (By licensed OT)     PT Frequency: 5 times a week OT Frequency: 5 times a week            Contractures Contractures Info: Not present    Additional Factors Info  Code Status, Allergies, Isolation Precautions Code Status Info: DNR Allergies Info: Aricept (Donepezil Hcl)     Isolation Precautions Info: Droplet and contact precautions     Current Medications (06/21/2022):  This is the current hospital active medication list Current Facility-Administered Medications  Medication Dose Route Frequency Provider Last Rate Last Admin   acetaminophen (TYLENOL) tablet 650 mg  650 mg Oral Q6H PRN Verdene Lennert, MD   650 mg at 06/18/22 2203   Or   acetaminophen (TYLENOL) suppository 650 mg  650 mg Rectal Q6H PRN Verdene Lennert, MD       amLODipine (NORVASC) tablet 5 mg  5 mg Oral Daily Kathrynn Running, MD   5 mg at 06/21/22 8469   And   benazepril (LOTENSIN) tablet 10 mg  10 mg Oral Daily Kathrynn Running, MD   10 mg at 06/21/22 6295   atorvastatin (LIPITOR) tablet 10 mg  10 mg Oral Daily Verdene Lennert, MD   10 mg at 06/21/22 2841   cyanocobalamin (VITAMIN B12) tablet 1,000 mcg  1,000 mcg Oral Daily  Verdene Lennert, MD   1,000 mcg at 06/21/22 0917   enoxaparin (LOVENOX) injection 40 mg  40 mg Subcutaneous Q24H Kathrynn Running, MD   40 mg at 06/20/22 2014   feeding supplement (ENSURE ENLIVE / ENSURE PLUS) liquid 237 mL  237 mL Oral BID BM Enedina Finner, MD   237 mL at 06/21/22 0918   folic acid (FOLVITE) tablet 1 mg  1 mg Oral Daily Verdene Lennert, MD   1 mg at 06/21/22 0917   labetalol (NORMODYNE) injection 10 mg  10 mg Intravenous Q2H PRN Manuela Schwartz, NP   10 mg at 06/19/22 2228   levothyroxine (SYNTHROID) tablet 125 mcg  125 mcg Oral Q0600 Verdene Lennert, MD   125 mcg  at 06/21/22 0540   multivitamin with minerals tablet 1 tablet  1 tablet Oral Daily Verdene Lennert, MD   1 tablet at 06/20/22 1225   ondansetron (ZOFRAN) tablet 4 mg  4 mg Oral Q6H PRN Verdene Lennert, MD       Or   ondansetron (ZOFRAN) injection 4 mg  4 mg Intravenous Q6H PRN Verdene Lennert, MD       rivastigmine (EXELON) 9.5 mg/24hr 9.5 mg  9.5 mg Transdermal Daily Verdene Lennert, MD   9.5 mg at 06/21/22 1610   sodium chloride flush (NS) 0.9 % injection 3 mL  3 mL Intravenous Q12H Verdene Lennert, MD   3 mL at 06/21/22 0919   sodium chloride tablet 3 g  3 g Oral TID WC Wouk, Wilfred Curtis, MD   3 g at 06/21/22 9604   thiamine (VITAMIN B1) tablet 100 mg  100 mg Oral Daily Verdene Lennert, MD   100 mg at 06/21/22 5409     Discharge Medications: Please see discharge summary for a list of discharge medications.  Relevant Imaging Results:  Relevant Lab Results:   Additional Information SS# [id number]  Allena Katz, LCSW

## 2022-06-21 NOTE — Progress Notes (Signed)
An USGPIV (ultrasound guided PIV) has been placed for short-term vasopressor or 3% infusion. A correctly placed ivWatch must be used when administering Vasopressors. Should this treatment be needed beyond 72 hours, central line access should be obtained.  It will be the responsibility of the bedside nurse to follow best practice to prevent extravasations.

## 2022-06-21 NOTE — Progress Notes (Addendum)
Daughter made aware about pt being transferred to ICU.

## 2022-06-22 DIAGNOSIS — E871 Hypo-osmolality and hyponatremia: Secondary | ICD-10-CM | POA: Diagnosis not present

## 2022-06-22 DIAGNOSIS — J208 Acute bronchitis due to other specified organisms: Secondary | ICD-10-CM

## 2022-06-22 DIAGNOSIS — B9781 Human metapneumovirus as the cause of diseases classified elsewhere: Secondary | ICD-10-CM

## 2022-06-22 DIAGNOSIS — S065XAA Traumatic subdural hemorrhage with loss of consciousness status unknown, initial encounter: Secondary | ICD-10-CM | POA: Diagnosis not present

## 2022-06-22 LAB — SODIUM
Sodium: 127 mmol/L — ABNORMAL LOW (ref 135–145)
Sodium: 132 mmol/L — ABNORMAL LOW (ref 135–145)
Sodium: 128 mmol/L — ABNORMAL LOW (ref 135–145)
Sodium: 129 mmol/L — ABNORMAL LOW (ref 135–145)
Sodium: 128 mmol/L — ABNORMAL LOW (ref 135–145)

## 2022-06-22 LAB — BASIC METABOLIC PANEL
Anion gap: 10 (ref 5–15)
BUN: 22 mg/dL (ref 8–23)
CO2: 25 mmol/L (ref 22–32)
Calcium: 9.2 mg/dL (ref 8.9–10.3)
Chloride: 97 mmol/L — ABNORMAL LOW (ref 98–111)
Creatinine, Ser: 0.67 mg/dL (ref 0.44–1.00)
GFR, Estimated: >60 mL/min (ref >60)
Glucose, Bld: 119 mg/dL — ABNORMAL HIGH (ref 70–99)
Potassium: 4.2 mmol/L (ref 3.5–5.1)
Sodium: 132 mmol/L — ABNORMAL LOW (ref 135–145)

## 2022-06-22 NOTE — Progress Notes (Signed)
PROGRESS NOTE    Laura Carpenter  ZOX:[id number] DOB: June 30, 1935 DOA: 06/18/2022 PCP: Dana Allan, MD   Assessment & Plan:   Principal Problem:   Subdural hematoma (HCC) Active Problems:   Altered mental status   Hyponatremia   Dementia (HCC)   Left shoulder pain   Hypothyroidism   Hypertension   Protein-calorie malnutrition, severe  Assessment and Plan: Subdural hematoma: w/ subarachnoid hemorrhage. Fell 2 times. No acute indication for surg as per neuro surg. Hold aspirin x 1 week. Will need to f/u w/ neuro surg, Dr. Adriana Simas, outpatient in 2-3 weeks.   Hyponatremia: s/p 3% saline. Labile. Mirtazapine on hold. Nephro following and recs apprec   Metapneumovirus respiratory infection: continue w/ supportive care.   Acute urinary retention: able to urinate independently. Bladder scan prn    Left shoulder pain: left shoulder x-ray no traumatic injury. Shows significant degenerative arthritis and questionable capsulitis/adhesivitis   Hypothyroidism: continue on home dose of synthroid    Dementia: continue w/ supportive care    HTN: continue on amlodipine, benazepril    Debility: PT/OT recs SNF. CM is working on this          DVT prophylaxis: lovenox  Code Status: DNR Family Communication: Disposition Plan: needs SNF placement   Level of care: Stepdown  Status is: Inpatient Remains inpatient appropriate because: severity of illness, needs SNF placement     Consultants:  Neuro surg   Procedures:   Antimicrobials:   Subjective: Pt c/o being sleepy   Objective: Vitals:   06/22/22 0600 06/22/22 0700 06/22/22 0800 06/22/22 0912  BP: (!) 149/100 (!) 124/91 (!) 156/86 (!) 148/96  Pulse: 81 62 80   Resp: (!) 26 (!) 22 18   Temp:   97.8 F (36.6 C)   TempSrc:   Oral   SpO2: 93% 93% 95%   Weight:      Height:        Intake/Output Summary (Last 24 hours) at 06/22/2022 0915 Last data filed at 06/22/2022 0600 Gross per 24 hour  Intake 345 ml  Output 2180 ml   Net -1835 ml   Filed Weights   06/18/22 1442 06/21/22 1558  Weight: 54.4 kg 54.4 kg    Examination:  General exam: Appears calm and comfortable  Respiratory system: Clear to auscultation. Respiratory effort normal. Cardiovascular system: S1 & S2+. No rubs, gallops or clicks. . Gastrointestinal system: Abdomen is nondistended, soft and nontender. Normal bowel sounds heard. Central nervous system:  Psychiatry: Judgement and insight appears not at baseline. Flat mood and affect      Data Reviewed: I have personally reviewed following labs and imaging studies  CBC: Recent Labs  Lab 06/18/22 1528 06/19/22 0451  WBC 5.2 5.4  NEUTROABS 4.0  --   HGB 14.3 13.0  HCT 41.9 37.8  MCV 93.7 93.6  PLT 184 166   Basic Metabolic Panel: Recent Labs  Lab 06/18/22 2047 06/19/22 0451 06/20/22 1007 06/21/22 0455 06/21/22 1350 06/21/22 2011 06/21/22 2233 06/22/22 0209 06/22/22 0621  NA 126* 128* 123* 123* 123* 125* 126* 127* 132*  K 3.4* 4.7 3.5 4.2  --   --   --   --  4.2  CL 89* 95* 85* 87*  --   --   --   --  97*  CO2 25 25 28 26   --   --   --   --  25  GLUCOSE 130* 114* 163* 121*  --   --   --   --  119*  BUN 14 18 13 16   --   --   --   --  22  CREATININE 0.63 0.79 0.60 0.63  --   --   --   --  0.67  CALCIUM 9.3 8.6* 9.5 9.3  --   --   --   --  9.2  MG 1.9  --   --   --   --   --   --   --   --    GFR: Estimated Creatinine Clearance: 45 mL/min (by C-G formula based on SCr of 0.67 mg/dL). Liver Function Tests: Recent Labs  Lab 06/18/22 1528  AST 56*  ALT 43  ALKPHOS 92  BILITOT 0.7  PROT 7.5  ALBUMIN 4.4   No results for input(s): "LIPASE", "AMYLASE" in the last 168 hours. No results for input(s): "AMMONIA" in the last 168 hours. Coagulation Profile: No results for input(s): "INR", "PROTIME" in the last 168 hours. Cardiac Enzymes: No results for input(s): "CKTOTAL", "CKMB", "CKMBINDEX", "TROPONINI" in the last 168 hours. BNP (last 3 results) No results for  input(s): "PROBNP" in the last 8760 hours. HbA1C: No results for input(s): "HGBA1C" in the last 72 hours. CBG: Recent Labs  Lab 06/21/22 1552  GLUCAP 105*   Lipid Profile: No results for input(s): "CHOL", "HDL", "LDLCALC", "TRIG", "CHOLHDL", "LDLDIRECT" in the last 72 hours. Thyroid Function Tests: No results for input(s): "TSH", "T4TOTAL", "FREET4", "T3FREE", "THYROIDAB" in the last 72 hours. Anemia Panel: Recent Labs    06/20/22 0452  VITAMINB12 1,582*   Sepsis Labs: Recent Labs  Lab 06/18/22 1535 06/18/22 1720 06/18/22 2047  PROCALCITON  --   --  <0.10  LATICACIDVEN 1.1 1.0  --     Recent Results (from the past 240 hour(s))  Respiratory (~20 pathogens) panel by PCR     Status: Abnormal   Collection Time: 06/18/22  8:45 PM   Specimen: Nasopharyngeal Swab; Respiratory  Result Value Ref Range Status   Adenovirus NOT DETECTED NOT DETECTED Final   Coronavirus 229E NOT DETECTED NOT DETECTED Final    Comment: (NOTE) The Coronavirus on the Respiratory Panel, DOES NOT test for the novel  Coronavirus (2019 nCoV)    Coronavirus HKU1 NOT DETECTED NOT DETECTED Final   Coronavirus NL63 NOT DETECTED NOT DETECTED Final   Coronavirus OC43 NOT DETECTED NOT DETECTED Final   Metapneumovirus DETECTED (A) NOT DETECTED Final   Rhinovirus / Enterovirus NOT DETECTED NOT DETECTED Final   Influenza A NOT DETECTED NOT DETECTED Final   Influenza B NOT DETECTED NOT DETECTED Final   Parainfluenza Virus 1 NOT DETECTED NOT DETECTED Final   Parainfluenza Virus 2 NOT DETECTED NOT DETECTED Final   Parainfluenza Virus 3 NOT DETECTED NOT DETECTED Final   Parainfluenza Virus 4 NOT DETECTED NOT DETECTED Final   Respiratory Syncytial Virus NOT DETECTED NOT DETECTED Final   Bordetella pertussis NOT DETECTED NOT DETECTED Final   Bordetella Parapertussis NOT DETECTED NOT DETECTED Final   Chlamydophila pneumoniae NOT DETECTED NOT DETECTED Final   Mycoplasma pneumoniae NOT DETECTED NOT DETECTED Final     Comment: Performed at [facility], [address] [place], [place] [address]  MRSA Next Gen by PCR, Nasal     Status: None   Collection Time: 06/21/22  4:00 PM   Specimen: Nasal Mucosa; Nasal Swab  Result Value Ref Range Status   MRSA by PCR Next Gen NOT DETECTED NOT DETECTED Final    Comment: (NOTE) The GeneXpert MRSA Assay (FDA approved  for NASAL specimens only), is one component of a comprehensive MRSA colonization surveillance program. It is not intended to diagnose MRSA infection nor to guide or monitor treatment for MRSA infections. Test performance is not FDA approved in patients less than 49 years old. Performed at [facility], [address] [place], [place] [address]          Radiology Studies: No results found.      Scheduled Meds:  amLODipine  5 mg Oral Daily   And   benazepril  10 mg Oral Daily   atorvastatin  10 mg Oral Daily   Chlorhexidine Gluconate Cloth  6 each Topical Daily   cyanocobalamin  1,000 mcg Oral Daily   enoxaparin (LOVENOX) injection  40 mg Subcutaneous Q24H   feeding supplement  237 mL Oral BID BM   folic acid  1 mg Oral Daily   levothyroxine  125 mcg Oral Q0600   multivitamin with minerals  1 tablet Oral Daily   rivastigmine  9.5 mg Transdermal Daily   sodium chloride flush  3 mL Intravenous Q12H   thiamine  100 mg Oral Daily   Continuous Infusions:   LOS: 2 days    Time spent: 25 mins     Charise Killian, MD [facility] Pager [phone]  If 7PM-7AM, please contact night-coverage [email or web address] 06/22/2022, 9:15 AM

## 2022-06-22 NOTE — Progress Notes (Signed)
[facility]  ROUNDING NOTE   Subjective:   Patient seen sitting up in bed Alert and oriented to self Requesting assistance with Dinner, redirected to breakfast  Room air  Sodium 132  Objective:  Vital signs in last 24 hours:  Temp:  [97.8 F (36.6 C)-98.9 F (37.2 C)] 97.8 F (36.6 C) (05/29 0800) Pulse Rate:  [28-113] 80 (05/29 0800) Resp:  [17-26] 18 (05/29 0800) BP: (80-159)/(66-108) 148/96 (05/29 0912) SpO2:  [91 %-100 %] 95 % (05/29 0800) Weight:  [54.4 kg] 54.4 kg (05/28 1558)  Weight change:  Filed Weights   06/18/22 1442 06/21/22 1558  Weight: 54.4 kg 54.4 kg    Intake/Output: I/O last 3 completed shifts: In: 345 [I.V.:345] Out: 2180 [Urine:2180]   Intake/Output this shift:  No intake/output data recorded.  Physical Exam: General: NAD  Head: Normocephalic, atraumatic. Moist oral mucosal membranes  Eyes: Anicteric  Lungs:  Clear to auscultation  Heart: Regular rate and rhythm  Abdomen:  Soft, nontender  Extremities:  No peripheral edema.  Neurologic: Alert, oriented to self, moving all four extremities  Skin: No lesions  Access: None     Basic Metabolic Panel: Recent Labs  Lab 06/18/22 2047 06/19/22 0451 06/20/22 1007 06/21/22 0455 06/21/22 1350 06/21/22 2011 06/21/22 2233 06/22/22 0209 06/22/22 0621  NA 126* 128* 123* 123* 123* 125* 126* 127* 132*  K 3.4* 4.7 3.5 4.2  --   --   --   --  4.2  CL 89* 95* 85* 87*  --   --   --   --  97*  CO2 25 25 28 26   --   --   --   --  25  GLUCOSE 130* 114* 163* 121*  --   --   --   --  119*  BUN 14 18 13 16   --   --   --   --  22  CREATININE 0.63 0.79 0.60 0.63  --   --   --   --  0.67  CALCIUM 9.3 8.6* 9.5 9.3  --   --   --   --  9.2  MG 1.9  --   --   --   --   --   --   --   --     Liver Function Tests: Recent Labs  Lab 06/18/22 1528  AST 56*  ALT 43  ALKPHOS 92  BILITOT 0.7  PROT 7.5  ALBUMIN 4.4   No results for input(s): "LIPASE", "AMYLASE" in the last 168 hours. No  results for input(s): "AMMONIA" in the last 168 hours.  CBC: Recent Labs  Lab 06/18/22 1528 06/19/22 0451  WBC 5.2 5.4  NEUTROABS 4.0  --   HGB 14.3 13.0  HCT 41.9 37.8  MCV 93.7 93.6  PLT 184 166    Cardiac Enzymes: No results for input(s): "CKTOTAL", "CKMB", "CKMBINDEX", "TROPONINI" in the last 168 hours.  BNP: Invalid input(s): "POCBNP"  CBG: Recent Labs  Lab 06/21/22 1552  GLUCAP 105*    Microbiology: Results for orders placed or performed during the hospital encounter of 06/18/22  Respiratory (~20 pathogens) panel by PCR     Status: Abnormal   Collection Time: 06/18/22  8:45 PM   Specimen: Nasopharyngeal Swab; Respiratory  Result Value Ref Range Status   Adenovirus NOT DETECTED NOT DETECTED Final   Coronavirus 229E NOT DETECTED NOT DETECTED Final    Comment: (NOTE) The Coronavirus on the Respiratory Panel, DOES NOT test for the  novel  Coronavirus (2019 nCoV)    Coronavirus HKU1 NOT DETECTED NOT DETECTED Final   Coronavirus NL63 NOT DETECTED NOT DETECTED Final   Coronavirus OC43 NOT DETECTED NOT DETECTED Final   Metapneumovirus DETECTED (A) NOT DETECTED Final   Rhinovirus / Enterovirus NOT DETECTED NOT DETECTED Final   Influenza A NOT DETECTED NOT DETECTED Final   Influenza B NOT DETECTED NOT DETECTED Final   Parainfluenza Virus 1 NOT DETECTED NOT DETECTED Final   Parainfluenza Virus 2 NOT DETECTED NOT DETECTED Final   Parainfluenza Virus 3 NOT DETECTED NOT DETECTED Final   Parainfluenza Virus 4 NOT DETECTED NOT DETECTED Final   Respiratory Syncytial Virus NOT DETECTED NOT DETECTED Final   Bordetella pertussis NOT DETECTED NOT DETECTED Final   Bordetella Parapertussis NOT DETECTED NOT DETECTED Final   Chlamydophila pneumoniae NOT DETECTED NOT DETECTED Final   Mycoplasma pneumoniae NOT DETECTED NOT DETECTED Final    Comment: Performed at [facility], [address] [place], [place] [address]  MRSA Next Gen by PCR, Nasal     Status: None    Collection Time: 06/21/22  4:00 PM   Specimen: Nasal Mucosa; Nasal Swab  Result Value Ref Range Status   MRSA by PCR Next Gen NOT DETECTED NOT DETECTED Final    Comment: (NOTE) The GeneXpert MRSA Assay (FDA approved for NASAL specimens only), is one component of a comprehensive MRSA colonization surveillance program. It is not intended to diagnose MRSA infection nor to guide or monitor treatment for MRSA infections. Test performance is not FDA approved in patients less than 55 years old. Performed at [facility], [address] [place], [place] [address]     Coagulation Studies: No results for input(s): "LABPROT", "INR" in the last 72 hours.  Urinalysis: No results for input(s): "COLORURINE", "LABSPEC", "PHURINE", "GLUCOSEU", "HGBUR", "BILIRUBINUR", "KETONESUR", "PROTEINUR", "UROBILINOGEN", "NITRITE", "LEUKOCYTESUR" in the last 72 hours.  Invalid input(s): "APPERANCEUR"    Imaging: No results found.   Medications:     amLODipine  5 mg Oral Daily   And   benazepril  10 mg Oral Daily   atorvastatin  10 mg Oral Daily   Chlorhexidine Gluconate Cloth  6 each Topical Daily   cyanocobalamin  1,000 mcg Oral Daily   enoxaparin (LOVENOX) injection  40 mg Subcutaneous Q24H   feeding supplement  237 mL Oral BID BM   folic acid  1 mg Oral Daily   levothyroxine  125 mcg Oral Q0600   multivitamin with minerals  1 tablet Oral Daily   rivastigmine  9.5 mg Transdermal Daily   sodium chloride flush  3 mL Intravenous Q12H   thiamine  100 mg Oral Daily   acetaminophen **OR** acetaminophen, labetalol, ondansetron **OR** ondansetron (ZOFRAN) IV  Assessment/ Plan:  Laura Carpenter is a 87 y.o.  female with past medical history of chronic hyponatremia, hypothyroidism, hyperlipidemia,  and fibromuscular dysplasia, who was admitted to [facility] on 06/18/2022 for Hyponatremia [E87.1] Subdural hematoma (HCC) [S06.5XAA] SDH (subdural hematoma) (HCC) [S06.5XAA] Fall in home, initial  encounter [W19.XXXA, Y92.009]    Acute on chronic hyponatremia likely secondary to subdural and subarachnoid hematoma. Baseline sodium 133-136. Sodium 128 on admission and decreased 123.  Patient transferred to ICU and placed on 3% hypertonic saline yesterday. Sodium corrected to 132 today, IVF stopped. Currently not prescribed salt tabs, will monitor next level.      LOS: 2   5/29/202410:09 AM

## 2022-06-22 NOTE — Progress Notes (Signed)
Physical Therapy Treatment Patient Details Name: Laura Carpenter MRN: [id number] DOB: July 11, 1935 Today's Date: 06/22/2022   History of Present Illness Patient is a 87 year old female with acute respiratory failure with hypoxia, A fib with RVR, SDH. Recent fall with CT showing very small extra axial hematoma on left. Repeat scan shows stable blood but small focus of traumatic SAH on right. History of dementia, hypothyroidism, HTN, chronic hyponatremia. 2 falls just prior to admission. Otherwise, family denies falls.    PT Comments    Patient is agreeable to PT. She required physical assistance for bed mobility. Three standing bouts performed with assistance using rolling walker with bowel incontinence with each stand. Activity tolerance is limited by fatigue with stable vitals throughout. Anticipate the need for frequent physical assistance with mobility at discharge with continued PT recommended after this hospital stay.    Recommendations for follow up therapy are one component of a multi-disciplinary discharge planning process, led by the attending physician.  Recommendations may be updated based on patient status, additional functional criteria and insurance authorization.  Follow Up Recommendations  Can patient physically be transported by private vehicle: No    Assistance Recommended at Discharge Frequent or constant Supervision/Assistance  Patient can return home with the following A little help with walking and/or transfers;A little help with bathing/dressing/bathroom;Assist for transportation;Help with stairs or ramp for entrance;Assistance with cooking/housework;Direct supervision/assist for medications management   Equipment Recommendations   (to be determined at next veune of care)    Recommendations for Other Services       Precautions / Restrictions Precautions Precautions: Fall Restrictions Weight Bearing Restrictions: No     Mobility  Bed Mobility Overal bed mobility:  Needs Assistance Bed Mobility: Supine to Sit, Sit to Supine, Rolling Rolling: Min assist   Supine to sit: Mod assist Sit to supine: Mod assist   General bed mobility comments: assistance for LE and trunk support. verbal cues for technique with increased time and effort required    Transfers Overall transfer level: Needs assistance Equipment used: Rolling walker (2 wheels) Transfers: Sit to/from Stand Sit to Stand: Mod assist, Min assist           General transfer comment: 3 standing bouts performed. Mod A with first tand with Min A for subsequent stands.    Ambulation/Gait               General Gait Details: unable to progress ambulation due to bowel incontinence with each stand   Stairs             Wheelchair Mobility    Modified Rankin (Stroke Patients Only)       Balance Overall balance assessment: Needs assistance Sitting-balance support: Feet supported Sitting balance-Leahy Scale: Good     Standing balance support: Reliant on assistive device for balance Standing balance-Leahy Scale: Poor Standing balance comment: external support required                            Cognition Arousal/Alertness: Awake/alert Behavior During Therapy: WFL for tasks assessed/performed Overall Cognitive Status: History of cognitive impairments - at baseline                                 General Comments: Patient is able to follow single step commands with occasional repetition. She is HOH and reports not having her hearing aides  Exercises      General Comments General comments (skin integrity, edema, etc.): patient required to maintain standing balance. maximal assistance for pericare following bowel movement. patient had been moved to ICU for sodium monitoring (Dr Mayford Knife okay with PT to see patient today via secure chat)      Pertinent Vitals/Pain Pain Assessment Pain Assessment: No/denies pain    Home Living                           Prior Function            PT Goals (current goals can now be found in the care plan section) Acute Rehab PT Goals Patient Stated Goal: to go home PT Goal Formulation: With patient Time For Goal Achievement: 07/03/22 Potential to Achieve Goals: Fair Progress towards PT goals: Progressing toward goals    Frequency    Min 3X/week      PT Plan Current plan remains appropriate    Co-evaluation              AM-PAC PT "6 Clicks" Mobility   Outcome Measure  Help needed turning from your back to your side while in a flat bed without using bedrails?: A Little Help needed moving from lying on your back to sitting on the side of a flat bed without using bedrails?: A Lot Help needed moving to and from a bed to a chair (including a wheelchair)?: A Lot Help needed standing up from a chair using your arms (e.g., wheelchair or bedside chair)?: A Lot Help needed to walk in hospital room?: A Lot Help needed climbing 3-5 steps with a railing? : A Lot 6 Click Score: 13    End of Session   Activity Tolerance: Patient tolerated treatment well Patient left: in bed;with call bell/phone within reach;with bed alarm set   PT Visit Diagnosis: Unsteadiness on feet (R26.81);Muscle weakness (generalized) (M62.81)     Time: 4098-1191 PT Time Calculation (min) (ACUTE ONLY): 23 min  Charges:  $Therapeutic Activity: 23-37 mins                     Donna Bernard, PT, MPT   Ina Homes 06/22/2022, 1:41 PM

## 2022-06-23 ENCOUNTER — Telehealth: Payer: Self-pay

## 2022-06-23 DIAGNOSIS — F0390 Unspecified dementia without behavioral disturbance: Secondary | ICD-10-CM

## 2022-06-23 DIAGNOSIS — S065XAA Traumatic subdural hemorrhage with loss of consciousness status unknown, initial encounter: Secondary | ICD-10-CM | POA: Diagnosis not present

## 2022-06-23 DIAGNOSIS — E871 Hypo-osmolality and hyponatremia: Secondary | ICD-10-CM | POA: Diagnosis not present

## 2022-06-23 LAB — SODIUM
Sodium: 128 mmol/L — ABNORMAL LOW (ref 135–145)
Sodium: 130 mmol/L — ABNORMAL LOW (ref 135–145)
Sodium: 129 mmol/L — ABNORMAL LOW (ref 135–145)

## 2022-06-23 LAB — BASIC METABOLIC PANEL
Anion gap: 6 (ref 5–15)
BUN: 19 mg/dL (ref 8–23)
CO2: 27 mmol/L (ref 22–32)
Calcium: 8.6 mg/dL — ABNORMAL LOW (ref 8.9–10.3)
Chloride: 96 mmol/L — ABNORMAL LOW (ref 98–111)
Creatinine, Ser: 0.53 mg/dL (ref 0.44–1.00)
GFR, Estimated: >60 mL/min (ref >60)
Glucose, Bld: 115 mg/dL — ABNORMAL HIGH (ref 70–99)
Potassium: 4.2 mmol/L (ref 3.5–5.1)
Sodium: 129 mmol/L — ABNORMAL LOW (ref 135–145)

## 2022-06-23 LAB — CBC
HCT: 37.6 % (ref 36.0–46.0)
Hemoglobin: 13.1 g/dL (ref 12.0–15.0)
MCH: 32.2 pg (ref 26.0–34.0)
MCHC: 34.8 g/dL (ref 30.0–36.0)
MCV: 92.4 fL (ref 80.0–100.0)
Platelets: 212 10*3/uL (ref 150–400)
RBC: 4.07 MIL/uL (ref 3.87–5.11)
RDW: 12.8 % (ref 11.5–15.5)
WBC: 6.4 10*3/uL (ref 4.0–10.5)
nRBC: 0 % (ref 0.0–0.2)

## 2022-06-23 MED ORDER — ASPIRIN 81 MG PO TABS
81.0000 mg | ORAL_TABLET | Freq: Every day | ORAL | Status: DC
Start: 2022-06-23 — End: 2022-07-14

## 2022-06-23 MED ORDER — SODIUM CHLORIDE 1 G PO TABS
1.0000 g | ORAL_TABLET | Freq: Three times a day (TID) | ORAL | Status: DC
  Administered 2022-06-23: 1 g via ORAL
  Filled 2022-06-23: qty 1

## 2022-06-23 MED ORDER — SODIUM CHLORIDE 1 G PO TABS: 1.0000 g | ORAL_TABLET | Freq: Three times a day (TID) | ORAL | 0 refills | Status: AC

## 2022-06-23 NOTE — Care Management Important Message (Signed)
Important Message  Patient Details  Name: Laura Carpenter MRN: [id number] Date of Birth: 04-25-1935   Medicare Important Message Given:  Yes     Olegario Messier A Ronnae Kaser 06/23/2022, 2:08 PM

## 2022-06-23 NOTE — Progress Notes (Signed)
Occupational Therapy Treatment Patient Details Name: Laura Carpenter MRN: [id number] DOB: 19-Aug-1935 Today's Date: 06/23/2022   History of present illness Patient is a 87 year old female with acute respiratory failure with hypoxia, A fib with RVR, SDH. Recent fall with CT showing very small extra axial hematoma on left. Repeat scan shows stable blood but small focus of traumatic SAH on right. History of dementia, hypothyroidism, HTN, chronic hyponatremia. 2 falls just prior to admission. Otherwise, family denies falls.   OT comments  Upon entering the room, pt supine in bed and resting comfortably. Pt endorses need to urinate and would like to walk into bathroom. Pt performs supine >sit with supervision to EOB. Pt stands and begins to have incontinent BM on floor and is unaware. Pt takes several steps over to [facility] with min HHA. Pt voids further while on [facility] but needs assistance for hygiene this session and then returns to bed secondary to fatigue in same manner as above. Call bell and all needed items within reach.     Recommendations for follow up therapy are one component of a multi-disciplinary discharge planning process, led by the attending physician.  Recommendations may be updated based on patient status, additional functional criteria and insurance authorization.    Assistance Recommended at Discharge Frequent or constant Supervision/Assistance  Patient can return home with the following  A little help with walking and/or transfers;A little help with bathing/dressing/bathroom;Assistance with cooking/housework;Direct supervision/assist for medications management;Direct supervision/assist for financial management;Assist for transportation;Help with stairs or ramp for entrance;Other (comment)   Equipment Recommendations  Other (comment) (defer to next venue of care)       Precautions / Restrictions Precautions Precautions: Fall       Mobility Bed Mobility Overal bed mobility: Needs  Assistance Bed Mobility: Supine to Sit, Sit to Supine, Rolling     Supine to sit: Supervision Sit to supine: Supervision   General bed mobility comments: increased time and cuing for technique    Transfers Overall transfer level: Needs assistance Equipment used: 1 person hand held assist Transfers: Sit to/from Stand, Bed to chair/wheelchair/BSC Sit to Stand: Min assist     Step pivot transfers: Min assist           Balance Overall balance assessment: Needs assistance Sitting-balance support: Feet supported Sitting balance-Leahy Scale: Good     Standing balance support: Single extremity supported, During functional activity Standing balance-Leahy Scale: Poor                             ADL either performed or assessed with clinical judgement   ADL Overall ADL's : Needs assistance/impaired                         Toilet Transfer: Minimal assistance;BSC/3in1   Toileting- Clothing Manipulation and Hygiene: Minimal assistance Toileting - Clothing Manipulation Details (indicate cue type and reason): assistance for hygiene            Extremity/Trunk Assessment Upper Extremity Assessment Upper Extremity Assessment: Generalized weakness   Lower Extremity Assessment Lower Extremity Assessment: Generalized weakness        Vision Patient Visual Report: No change from baseline            Cognition Arousal/Alertness: Awake/alert Behavior During Therapy: WFL for tasks assessed/performed Overall Cognitive Status: History of cognitive impairments - at baseline  General Comments: Pt follows 1 step commands with increased time. She is HOH.                   Pertinent Vitals/ Pain       Pain Assessment Pain Assessment: No/denies pain         Frequency  Min 2X/week        Progress Toward Goals  OT Goals(current goals can now be found in the care plan section)  Progress towards OT  goals: Progressing toward goals     Plan Discharge plan remains appropriate;Frequency remains appropriate       AM-PAC OT "6 Clicks" Daily Activity     Outcome Measure   Help from another person eating meals?: None Help from another person taking care of personal grooming?: A Little Help from another person toileting, which includes using toliet, bedpan, or urinal?: A Little Help from another person bathing (including washing, rinsing, drying)?: A Little Help from another person to put on and taking off regular upper body clothing?: None Help from another person to put on and taking off regular lower body clothing?: A Little 6 Click Score: 20    End of Session    OT Visit Diagnosis: Unsteadiness on feet (R26.81);Repeated falls (R29.6);Other abnormalities of gait and mobility (R26.89)   Activity Tolerance Patient tolerated treatment well   Patient Left in bed;with call bell/phone within reach;with family/visitor present   Nurse Communication Mobility status        Time: 8295-6213 OT Time Calculation (min): 14 min  Charges: OT General Charges $OT Visit: 1 Visit OT Treatments $Self Care/Home Management : 8-22 mins  Jackquline Denmark, MS, OTR/L , CBIS ascom [phone]  06/23/22, 11:49 AM

## 2022-06-23 NOTE — Progress Notes (Signed)
[facility]  ROUNDING NOTE   Subjective:   Patient seen sitting up in bed Remains drowsy today  No family present  Sodium 128  Objective:  Vital signs in last 24 hours:  Temp:  [98.2 F (36.8 C)-99.2 F (37.3 C)] 98.2 F (36.8 C) (05/30 0717) Pulse Rate:  [57-98] 57 (05/30 0717) Resp:  [16-21] 16 (05/30 0717) BP: (113-153)/(72-107) 138/92 (05/30 0717) SpO2:  [91 %-97 %] 96 % (05/30 0717) FiO2 (%):  [21 %] 21 % (05/29 2210)  Weight change:  Filed Weights   06/18/22 1442 06/21/22 1558  Weight: 54.4 kg 54.4 kg    Intake/Output: I/O last 3 completed shifts: In: 330 [I.V.:330] Out: 950 [Urine:950]   Intake/Output this shift:  No intake/output data recorded.  Physical Exam: General: NAD  Head: Normocephalic, atraumatic. Moist oral mucosal membranes  Eyes: Anicteric  Lungs:  Clear to auscultation  Heart: Regular rate and rhythm  Abdomen:  Soft, nontender  Extremities:  No peripheral edema.  Neurologic: Alert, oriented to self, moving all four extremities  Skin: No lesions  Access: None     Basic Metabolic Panel: Recent Labs  Lab 06/18/22 2047 06/19/22 0451 06/20/22 1007 06/21/22 0455 06/21/22 1350 06/22/22 0621 06/22/22 0954 06/22/22 1749 06/22/22 2153 06/23/22 0225 06/23/22 0539 06/23/22 1016  NA 126* 128* 123* 123*   < > 132*   < > 129* 128* 129* 128* 130*  K 3.4* 4.7 3.5 4.2  --  4.2  --   --   --  4.2  --   --   CL 89* 95* 85* 87*  --  97*  --   --   --  96*  --   --   CO2 25 25 28 26   --  25  --   --   --  27  --   --   GLUCOSE 130* 114* 163* 121*  --  119*  --   --   --  115*  --   --   BUN 14 18 13 16   --  22  --   --   --  19  --   --   CREATININE 0.63 0.79 0.60 0.63  --  0.67  --   --   --  0.53  --   --   CALCIUM 9.3 8.6* 9.5 9.3  --  9.2  --   --   --  8.6*  --   --   MG 1.9  --   --   --   --   --   --   --   --   --   --   --    < > = values in this interval not displayed.     Liver Function Tests: Recent Labs  Lab  06/18/22 1528  AST 56*  ALT 43  ALKPHOS 92  BILITOT 0.7  PROT 7.5  ALBUMIN 4.4    No results for input(s): "LIPASE", "AMYLASE" in the last 168 hours. No results for input(s): "AMMONIA" in the last 168 hours.  CBC: Recent Labs  Lab 06/18/22 1528 06/19/22 0451 06/23/22 0225  WBC 5.2 5.4 6.4  NEUTROABS 4.0  --   --   HGB 14.3 13.0 13.1  HCT 41.9 37.8 37.6  MCV 93.7 93.6 92.4  PLT 184 166 212     Cardiac Enzymes: No results for input(s): "CKTOTAL", "CKMB", "CKMBINDEX", "TROPONINI" in the last 168 hours.  BNP: Invalid input(s): "POCBNP"  CBG: Recent  Labs  Lab 06/21/22 1552  GLUCAP 105*     Microbiology: Results for orders placed or performed during the hospital encounter of 06/18/22  Respiratory (~20 pathogens) panel by PCR     Status: Abnormal   Collection Time: 06/18/22  8:45 PM   Specimen: Nasopharyngeal Swab; Respiratory  Result Value Ref Range Status   Adenovirus NOT DETECTED NOT DETECTED Final   Coronavirus 229E NOT DETECTED NOT DETECTED Final    Comment: (NOTE) The Coronavirus on the Respiratory Panel, DOES NOT test for the novel  Coronavirus (2019 nCoV)    Coronavirus HKU1 NOT DETECTED NOT DETECTED Final   Coronavirus NL63 NOT DETECTED NOT DETECTED Final   Coronavirus OC43 NOT DETECTED NOT DETECTED Final   Metapneumovirus DETECTED (A) NOT DETECTED Final   Rhinovirus / Enterovirus NOT DETECTED NOT DETECTED Final   Influenza A NOT DETECTED NOT DETECTED Final   Influenza B NOT DETECTED NOT DETECTED Final   Parainfluenza Virus 1 NOT DETECTED NOT DETECTED Final   Parainfluenza Virus 2 NOT DETECTED NOT DETECTED Final   Parainfluenza Virus 3 NOT DETECTED NOT DETECTED Final   Parainfluenza Virus 4 NOT DETECTED NOT DETECTED Final   Respiratory Syncytial Virus NOT DETECTED NOT DETECTED Final   Bordetella pertussis NOT DETECTED NOT DETECTED Final   Bordetella Parapertussis NOT DETECTED NOT DETECTED Final   Chlamydophila pneumoniae NOT DETECTED NOT DETECTED  Final   Mycoplasma pneumoniae NOT DETECTED NOT DETECTED Final    Comment: Performed at [facility], [address], [place], [place] [address]  MRSA Next Gen by PCR, Nasal     Status: None   Collection Time: 06/21/22  4:00 PM   Specimen: Nasal Mucosa; Nasal Swab  Result Value Ref Range Status   MRSA by PCR Next Gen NOT DETECTED NOT DETECTED Final    Comment: (NOTE) The GeneXpert MRSA Assay (FDA approved for NASAL specimens only), is one component of a comprehensive MRSA colonization surveillance program. It is not intended to diagnose MRSA infection nor to guide or monitor treatment for MRSA infections. Test performance is not FDA approved in patients less than 86 years old. Performed at [facility], [address] [place], [place] [address]     Coagulation Studies: No results for input(s): "LABPROT", "INR" in the last 72 hours.  Urinalysis: No results for input(s): "COLORURINE", "LABSPEC", "PHURINE", "GLUCOSEU", "HGBUR", "BILIRUBINUR", "KETONESUR", "PROTEINUR", "UROBILINOGEN", "NITRITE", "LEUKOCYTESUR" in the last 72 hours.  Invalid input(s): "APPERANCEUR"    Imaging: No results found.   Medications:     amLODipine  5 mg Oral Daily   And   benazepril  10 mg Oral Daily   atorvastatin  10 mg Oral Daily   Chlorhexidine Gluconate Cloth  6 each Topical Daily   enoxaparin (LOVENOX) injection  40 mg Subcutaneous Q24H   feeding supplement  237 mL Oral BID BM   folic acid  1 mg Oral Daily   levothyroxine  125 mcg Oral Q0600   multivitamin with minerals  1 tablet Oral Daily   rivastigmine  9.5 mg Transdermal Daily   sodium chloride flush  3 mL Intravenous Q12H   sodium chloride  1 g Oral TID WC   thiamine  100 mg Oral Daily   acetaminophen **OR** acetaminophen, labetalol, ondansetron **OR** ondansetron (ZOFRAN) IV  Assessment/ Plan:  Laura Carpenter is a 87 y.o.  female with past medical history of chronic hyponatremia, hypothyroidism, hyperlipidemia,   and fibromuscular dysplasia, who was admitted to [facility] on 06/18/2022 for Hyponatremia [  E87.1] Subdural hematoma (HCC) [S06.5XAA] SDH (subdural hematoma) (HCC) [S06.5XAA] Fall in home, initial encounter [W19.XXXA, Y92.009]    Acute on chronic hyponatremia likely secondary to subdural and subarachnoid hematoma. Baseline sodium 133-136. Sodium 128 on admission and decreased 123.  Receiving 3% hypertonic saline in ICU and was discontinued on 06/22/22. Sodium now 128, will order salt tabs 1g three times daily. Patient cleared to discharge from renal stance.      LOS: 3   5/30/20241:47 PM

## 2022-06-23 NOTE — Telephone Encounter (Signed)
Patient was seen in the ER for a subdural hematoma. She will follow up with [facility] on 07/12/22, and get a repeat head CT prior.   A message has been sent to scheduling.

## 2022-06-23 NOTE — Progress Notes (Signed)
Physical Therapy Treatment Patient Details Name: Laura Carpenter MRN: [id number] DOB: 08-May-1935 Today's Date: 06/23/2022   History of Present Illness Patient is a 87 year old female with acute respiratory failure with hypoxia, A fib with RVR, SDH. Recent fall with CT showing very small extra axial hematoma on left. Repeat scan shows stable blood but small focus of traumatic SAH on right. History of dementia, hypothyroidism, HTN, chronic hyponatremia. 2 falls just prior to admission. Otherwise, family denies falls.    PT Comments    Patient is agreeable to PT. Gait training performed with and without device. Patient has short step length with and without rolling walker, however less physical assistance required for steadying using device. Activity tolerance limited by fatigue. Recommend to continue PT to maximize independence and facilitate return to prior level of function.    Recommendations for follow up therapy are one component of a multi-disciplinary discharge planning process, led by the attending physician.  Recommendations may be updated based on patient status, additional functional criteria and insurance authorization.  Follow Up Recommendations  Can patient physically be transported by private vehicle: No    Assistance Recommended at Discharge Frequent or constant Supervision/Assistance  Patient can return home with the following A little help with walking and/or transfers;A little help with bathing/dressing/bathroom;Assist for transportation;Help with stairs or ramp for entrance;Assistance with cooking/housework;Direct supervision/assist for medications management   Equipment Recommendations  None recommended by PT    Recommendations for Other Services       Precautions / Restrictions Precautions Precautions: Fall Restrictions Weight Bearing Restrictions: No     Mobility  Bed Mobility Overal bed mobility: Needs Assistance Bed Mobility: Supine to Sit, Sit to Supine,  Rolling     Supine to sit: Supervision Sit to supine: Min assist   General bed mobility comments: assistance for LE support. increased time with cues for sequencing    Transfers Overall transfer level: Needs assistance Equipment used: 1 person hand held assist Transfers: Sit to/from Stand, Bed to chair/wheelchair/BSC Sit to Stand: Min assist           General transfer comment: lifting assistance to stand. verbal cues for hand placement    Ambulation/Gait Ambulation/Gait assistance: Min assist, Min guard Gait Distance (Feet): 15 Feet (x 2) Assistive device: Rolling walker (2 wheels), 1 person hand held assist Gait Pattern/deviations: Narrow base of support, Decreased stride length Gait velocity: decreased     General Gait Details: steadying assistance required for ambulation without device with frequent reaching out for furniture. without rolling walker, patient required Min A for rolling walker negotiation but continued to need cues for increased step length with difficulty performing. short, near shuffle steps   Stairs             Wheelchair Mobility    Modified Rankin (Stroke Patients Only)       Balance Overall balance assessment: Needs assistance Sitting-balance support: Feet supported Sitting balance-Leahy Scale: Good     Standing balance support: Single extremity supported, During functional activity Standing balance-Leahy Scale: Poor Standing balance comment: external support required                            Cognition Arousal/Alertness: Awake/alert Behavior During Therapy: WFL for tasks assessed/performed Overall Cognitive Status: History of cognitive impairments - at baseline  General Comments: patient can follow single step commands with increased time. difficulty sequencing tasks with cues required        Exercises      General Comments        Pertinent Vitals/Pain Pain  Assessment Pain Assessment: No/denies pain    Home Living                          Prior Function            PT Goals (current goals can now be found in the care plan section) Acute Rehab PT Goals Patient Stated Goal: to go home PT Goal Formulation: With patient Time For Goal Achievement: 07/03/22 Potential to Achieve Goals: Fair Progress towards PT goals: Progressing toward goals    Frequency    Min 3X/week      PT Plan Current plan remains appropriate    Co-evaluation              AM-PAC PT "6 Clicks" Mobility   Outcome Measure  Help needed turning from your back to your side while in a flat bed without using bedrails?: A Little Help needed moving from lying on your back to sitting on the side of a flat bed without using bedrails?: A Lot Help needed moving to and from a bed to a chair (including a wheelchair)?: A Lot Help needed standing up from a chair using your arms (e.g., wheelchair or bedside chair)?: A Lot Help needed to walk in hospital room?: A Lot Help needed climbing 3-5 steps with a railing? : A Lot 6 Click Score: 13    End of Session   Activity Tolerance: Patient tolerated treatment well Patient left: in bed;with call bell/phone within reach;with bed alarm set Nurse Communication: Mobility status PT Visit Diagnosis: Unsteadiness on feet (R26.81);Muscle weakness (generalized) (M62.81)     Time: 6295-2841 PT Time Calculation (min) (ACUTE ONLY): 17 min  Charges:  $Gait Training: 8-22 mins                     Donna Bernard, PT, MPT   Ina Homes 06/23/2022, 1:09 PM

## 2022-06-23 NOTE — Progress Notes (Signed)
Nutrition Follow-up  DOCUMENTATION CODES:   Severe malnutrition in context of chronic illness  INTERVENTION:   -Continue with regular diet -Continue MVI with minerals daily -Continue Ensure Enlive po BID, each supplement provides 350 kcal and 20 grams of protein -Continue Magic cup BID with meals, each supplement provides 290 kcal and 9 grams of protein   NUTRITION DIAGNOSIS:   Severe Malnutrition related to chronic illness (dementia) as evidenced by moderate fat depletion, severe fat depletion, moderate muscle depletion, severe muscle depletion.  Ongoing  GOAL:   Patient will meet greater than or equal to 90% of their needs  Progressing   MONITOR:   PO intake, Supplement acceptance  REASON FOR ASSESSMENT:   Consult Assessment of nutrition requirement/status  ASSESSMENT:   87 y.o. female admits related to fall and arm injury. PMH includes: Alzheimer's dementia, HTN, HLD, breast cancer. Pt is currently receiving medical management related to subdural hematoma.  Reviewed I/O's: -150 ml x 24 hours and -2.6 L since admission  UOP: 150 ml x 24 hours   Pt lying in bed at time of visit. She did not arouse to voice. She consumed 100% of eggs and a few bites of sausage. Noted meal completions variable; PO 25-100%. Pt is drinking Ensure supplements.   Pt has been prescribed sodium tablet by nephrology secondary to hyponatremia.   Wt has been stable since admission.   Per TOC notes, pt awaiting SNF placement for discharge.   Medications reviewed and include folic acid, sodium chloride, and thiamine.   Labs reviewed: Na: 128, CBGS: 105 (inpatient orders for glycemic control are none).    Diet Order:   Diet Order             Diet regular Room service appropriate? Yes; Fluid consistency: Thin  Diet effective now                   EDUCATION NEEDS:   Education needs have been addressed  Skin:  Skin Assessment: Reviewed RN Assessment  Last BM:   06/23/22  Height:   Ht Readings from Last 1 Encounters:  06/21/22 5\' 7"  (1.702 m)    Weight:   Wt Readings from Last 1 Encounters:  06/21/22 54.4 kg    Ideal Body Weight:  61.4 kg  BMI:  Body mass index is 18.78 kg/m.  Estimated Nutritional Needs:   Kcal:  1650-1850  Protein:  80-95 grams  Fluid:  > 1.6 L    Levada Schilling, RD, LDN, CDCES Registered Dietitian II Certified Diabetes Care and Education Specialist Please refer to [facility] for RD and/or RD on-call/weekend/after hours pager

## 2022-06-23 NOTE — Discharge Summary (Addendum)
Physician Discharge Summary  Laura Carpenter ZOX:[id number] DOB: 06-Sep-1935 DOA: 06/18/2022  PCP: Dana Allan, MD  Admit date: 06/18/2022 Discharge date: 06/23/2022  Admitted From: home  Disposition:  SNF  Recommendations for Outpatient Follow-up:  Follow up with PCP in 1-2 weeks Check sodium level in 1-2 days F/u w/ neuro surg, Dr. Adriana Simas, in 2-3 weeks F/u w/ nephro, Dr. Cherylann Ratel, in 1-2 weeks   Home Health: no  Equipment/Devices:  Discharge Condition: stable  CODE STATUS: DNR Diet recommendation: Regular  Brief/Interim Summary: HPI was taken from Dr. Huel Cote: Charlett Lango is a 87 y.o. female with medical history significant of Alzheimer's dementia, chronic hyponatremia, hypertension, hyperlipidemia, hypothyroidism, fibromuscular dysplasia of the renal artery, who presents to the ED due to a ground-level fall.  History obtained from both daughter at bedside and patient due to patient's history of dementia.   Patient's daughter Almyra Free states that on 5/23, patient was walking down the driveway with patient's husband.  The driveway is down sloped and Mrs. Berthelot lost her footing and fell face forward landing onto the left side of her head and left shoulder.  Mr. Klitzke did not notice any loss of consciousness and patient was able to get up and walk back inside the house.  Since then, patient has been experiencing left shoulder pain.  There was an additional fall on 5/23 evening after getting up to use the restroom.  No head trauma or loss of consciousness noted.  No injuries noted from this fall.  Patient seen at her baseline mentation until this morning, when patient's son noticed that patient was having difficulty getting dressed.  She seemed confused by the steps needed to be taken.   At this time, Mrs. Garand denies any nausea, vomiting, diarrhea, chest pain, shortness of breath, dizziness, double vision or headache.  She states that her left shoulder does not hurt until she tries to move it.    ED course: On arrival to the ED, patient was hypertensive at 175/109 with heart rate of 83.  She was saturating at 95% on room air.  She was afebrile at 98.6.  Initial workup notable for normal CBC, and CMP with sodium of 128, glucose 124, creatinine 0.79 and GFR above 60.  Lactic acid within normal limits at 1.1.  Urinalysis was obtained, however unclean catch.  CT of the head, C-spine and maxillofacial was obtained with evidence of a small 5 mm left subdural hematoma with no mass effect.  Neurosurgery was consulted with recommendations to repeat CT in 6 to 8 hours.  [facility] contacted for admission.   As per Dr. Mayford Knife 5/29-5/30/24: Pt was found to have a subdural hematoma w/ subarachnoid hemorrhage after a fall at home. Neuro surg evaluated the pt but no acute surgery was indicated. Aspirin should be held x 1 week. Pt will need to f/u outpatient w/ neuro surg, Dr. Adriana Simas, w/in 2-3 weeks. Of note, pt was also found to have hyponatremia that required 3% saline. 3% saline was d/c and pt was placed on NaCl tabs as per nephro. Will need to f/u w/ nephro, Dr. Cherylann Ratel, in 1-2 weeks. Sodium level will need to check in 1-2 days. PT/OT evaluated the pt and recommended SNF. For more information, please previous progress/consult notes.   Discharge Diagnoses:  Principal Problem:   Subdural hematoma (HCC) Active Problems:   Altered mental status   Hyponatremia   Dementia (HCC)   Left shoulder pain   Hypothyroidism   Hypertension   Protein-calorie malnutrition, severe  Subdural hematoma: w/ subarachnoid hemorrhage. Fell 2 times. No acute indication for surg as per neuro surg. Hold aspirin x 1 week. Will need to f/u w/ neuro surg, Dr. Adriana Simas, outpatient in 2-3 weeks.   Hyponatremia: s/p 3% saline. Labile. Started on NaCl tabs as per nephro. Nephro following and recs apprec   Metapneumovirus respiratory infection: continue w/ supportive care.   Acute urinary retention: able to urinate independently currently.  Bladder scan prn   Left shoulder pain: left shoulder x-ray no traumatic injury. Shows significant degenerative arthritis and questionable capsulitis/adhesivitis   Hypothyroidism: continue on home dose of synthroid    Dementia: continue w/ supportive care    HTN: continue on amlodipine, benazepril    Debility: PT/OT recs SNF.   Discharge Instructions  Discharge Instructions     Diet general   Complete by: As directed    Discharge instructions   Complete by: As directed    F/u w/ PCP in 1-2 weeks. F/u w/ neuro surg, Dr. Adriana Simas, in 2-3 weeks. Check sodium level in 1-2 days. F/u w/ nephro, Dr. Cherylann Ratel, in 1-2 weeks   Increase activity slowly   Complete by: As directed       Allergies as of 06/23/2022       Reactions   Aricept [donepezil Hcl]    GI upset         Medication List     STOP taking these medications    amLODipine 2.5 MG tablet Commonly known as: NORVASC   cyanocobalamin 1000 MCG tablet Commonly known as: VITAMIN B12       TAKE these medications    amlodipine-benazepril 2.5-10 MG capsule Commonly known as: LOTREL TAKE 1 CAPSULE BY MOUTH ONCE DAILY   aspirin 81 MG tablet Take 1 tablet (81 mg total) by mouth daily. Hold aspirin x 1 week as per neuro surg What changed: additional instructions   atorvastatin 10 MG tablet Commonly known as: LIPITOR TAKE ONE TABLET BY MOUTH EVERY DAY AT 6PM   levothyroxine 125 MCG tablet Commonly known as: SYNTHROID Take 1 tablet (125 mcg total) by mouth daily before breakfast.   mirtazapine 7.5 MG tablet Commonly known as: REMERON Take 7.5 mg by mouth at bedtime. Take 1 tablet daily   rivastigmine 9.5 mg/24hr Commonly known as: EXELON   sodium chloride 1 g tablet Take 1 tablet (1 g total) by mouth 3 (three) times daily with meals for 5 days.   Vitamin D3 50 MCG (2000 UT) Tabs Take 4,000 Units by mouth daily.        Follow-up Information     Susanne Borders, PA Follow up on 07/12/2022.   Specialty:  Neurosurgery Why: Pt to have CT head prior Contact information: [address] [place] [address] [phone]         Mady Haagensen, MD Follow up.   Specialty: Nephrology Why: F/u in 1-2 weeks Contact information: [address] [place] [facility] [address] [phone]                Allergies  Allergen Reactions   Aricept [Donepezil Hcl]     GI upset     Consultations: Nephro Neuro surg    Procedures/Studies: ECHOCARDIOGRAM COMPLETE  Result Date: 06/19/2022    ECHOCARDIOGRAM REPORT   Patient Name:   FREYDA ROZELL Date of Exam: 06/19/2022 Medical Rec #:  [id number]    Height:       67.0 in Accession #:    [id number]  Weight:       120.0 lb Date of Birth:  1935/12/01    BSA:          1.628 m Patient Age:    86 years     BP:           112/71 mmHg Patient Gender: F            HR:           75 bpm. Exam Location:  [facility] Procedure: 2D Echo Indications:     Atrial Fibrillation I48.91  History:         Patient has no prior history of Echocardiogram examinations.  Sonographer:     Overton Mam RDCS, FASE Referring Phys:  [id number] BRENDA MORRISON Diagnosing Phys: Julien Nordmann MD  Sonographer Comments: Technically challenging study due to limited acoustic windows and no apical window. IMPRESSIONS  1. Left ventricular ejection fraction, by estimation, is 60 to 65%. The left ventricle has normal function. The left ventricle has no regional wall motion abnormalities. Left ventricular diastolic parameters are indeterminate.  2. Right ventricular systolic function is normal. The right ventricular size is normal. There is normal pulmonary artery systolic pressure.  3. The mitral valve is normal in structure. Mild mitral valve regurgitation. No evidence of mitral stenosis.  4. The aortic valve is tricuspid. Aortic valve regurgitation is not visualized. No aortic stenosis is present.  5. The inferior vena cava is normal in size with greater than 50% respiratory  variability, suggesting right atrial pressure of 3 mmHg. FINDINGS  Left Ventricle: Left ventricular ejection fraction, by estimation, is 60 to 65%. The left ventricle has normal function. The left ventricle has no regional wall motion abnormalities. The left ventricular internal cavity size was normal in size. There is  no left ventricular hypertrophy. Left ventricular diastolic parameters are indeterminate. Right Ventricle: The right ventricular size is normal. No increase in right ventricular wall thickness. Right ventricular systolic function is normal. There is normal pulmonary artery systolic pressure. The tricuspid regurgitant velocity is 2.61 m/s, and  with an assumed right atrial pressure of 5 mmHg, the estimated right ventricular systolic pressure is 32.2 mmHg. Left Atrium: Left atrial size was normal in size. Right Atrium: Right atrial size was normal in size. Pericardium: There is no evidence of pericardial effusion. Mitral Valve: The mitral valve is normal in structure. Mild mitral valve regurgitation. No evidence of mitral valve stenosis. Tricuspid Valve: The tricuspid valve is normal in structure. Tricuspid valve regurgitation is mild . No evidence of tricuspid stenosis. Aortic Valve: The aortic valve is tricuspid. Aortic valve regurgitation is not visualized. No aortic stenosis is present. Pulmonic Valve: The pulmonic valve was normal in structure. Pulmonic valve regurgitation is mild. No evidence of pulmonic stenosis. Aorta: The aortic root is normal in size and structure. Venous: The inferior vena cava is normal in size with greater than 50% respiratory variability, suggesting right atrial pressure of 3 mmHg. IAS/Shunts: No atrial level shunt detected by color flow Doppler.  LEFT VENTRICLE PLAX 2D LVIDd:         4.00 cm LVIDs:         2.80 cm LV PW:         1.00 cm LV IVS:        0.80 cm LVOT diam:     1.60 cm LVOT Area:     2.01 cm  LEFT ATRIUM         Index LA diam:    2.80 cm  1.72 cm/m                         PULMONIC VALVE AORTA                 PV Vmax:        0.93 m/s Ao Root diam: 3.00 cm PV Peak grad:   3.5 mmHg                       RVOT Peak grad: 3 mmHg  TRICUSPID VALVE TR Peak grad:   27.2 mmHg TR Vmax:        261.00 cm/s  SHUNTS Systemic Diam: 1.60 cm Julien Nordmann MD Electronically signed by Julien Nordmann MD Signature Date/Time: 06/19/2022/6:36:24 PM    Final    CT HEAD WO CONTRAST ( )  Result Date: 06/18/2022 CLINICAL DATA:  Subdural hematoma EXAM: CT HEAD WITHOUT CONTRAST TECHNIQUE: Contiguous axial images were obtained from the base of the skull through the vertex without intravenous contrast. RADIATION DOSE REDUCTION: This exam was performed according to the departmental dose-optimization program which includes automated exposure control, adjustment of the mA and/or kV according to patient size and/or use of iterative reconstruction technique. COMPARISON:  06/18/2022 at 3:55 p.m. FINDINGS: Brain: Posterior left convexity subdural hematoma is unchanged measuring 6 mm. There is a new focus of subarachnoid hemorrhage measuring approximately 6 mm within the right sylvian fissure. No midline shift or other mass effect. Vascular: Calcific atherosclerosis of the carotid and vertebral arteries at skull base. Skull: Negative Sinuses/Orbits: Opacification of the right frontal and anterior ethmoid sinuses. The orbits are normal. Other: None IMPRESSION: 1. Unchanged left convexity subdural hematoma. 2. New focus of subarachnoid hemorrhage within the right Sylvian fissure. Electronically Signed   By: Deatra Robinson M.D.   On: 06/18/2022 22:51   DG Chest Port 1 View  Result Date: 06/18/2022 CLINICAL DATA:  Hypoxia. Tachycardia. EXAM: PORTABLE CHEST 1 VIEW COMPARISON:  Remote chest radiograph 02/12/2006. Lung bases from abdominal CT 04/13/2022 FINDINGS: There is patchy airspace disease in the left greater than right lung base. The heart is normal in size. Aortic atherosclerosis. Biapical  pleuroparenchymal scarring. No pleural fluid, pneumothorax, or pulmonary edema. Right chest wall/axillary surgical clips. Left shoulder arthropathy. IMPRESSION: Patchy airspace disease in the left greater than right lung base, suspicious for pneumonia. Electronically Signed   By: Narda Rutherford M.D.   On: 06/18/2022 21:06   DG Shoulder Left  Result Date: 06/18/2022 CLINICAL DATA:  Post fall, now with left shoulder pain. EXAM: LEFT SHOULDER - 2+ VIEW COMPARISON:  None Available. FINDINGS: No fracture or dislocation. Severe degenerative change of the left glenohumeral joint with near complete joint space loss, subchondral sclerosis and osteophytosis. Suspected loose bodies are noted about the superior and inferior aspect of the glenohumeral joint space though discrete donor sites are not identified. Acromioclavicular joint spaces appear preserved. Crescentic calcification about the superior aspect of the left humeral head may represent the sequela of calcific tendinitis versus adhesive capsulitis. Limited visualization of the adjacent thorax demonstrates atherosclerotic plaque within the aortic arch. Regional soft tissues appear normal. IMPRESSION: 1. No acute findings. 2. Severe degenerative change of the left glenohumeral joint with suspected loose bodies though discrete donor sites are not identified. 3. Eccentric calcifications about the superior aspect of the left humeral head may represent the sequela of calcific tendinitis versus adhesive capsulitis. Electronically Signed   By: Simonne Come M.D.   On: 06/18/2022 16:49  CT HEAD WO CONTRAST ( )  Result Date: 06/18/2022 CLINICAL DATA:  Head trauma, minor (Age >= 65y); Facial trauma, blunt; Neck trauma (Age >= 65y). Fall. EXAM: CT HEAD WITHOUT CONTRAST CT MAXILLOFACIAL WITHOUT CONTRAST CT CERVICAL SPINE WITHOUT CONTRAST TECHNIQUE: Multidetector CT imaging of the head, cervical spine, and maxillofacial structures were performed using the standard  protocol without intravenous contrast. Multiplanar CT image reconstructions of the cervical spine and maxillofacial structures were also generated. RADIATION DOSE REDUCTION: This exam was performed according to the departmental dose-optimization program which includes automated exposure control, adjustment of the mA and/or kV according to patient size and/or use of iterative reconstruction technique. COMPARISON:  Head MRI 12/05/2014 FINDINGS: CT HEAD FINDINGS Brain: A small acute subdural hematoma over the left cerebral convexity measures up to 5 mm in thickness without significant mass effect. No acute infarct, mass, or midline shift is identified. There is mild cerebral atrophy. Vascular: Calcified atherosclerosis at the skull base. Skull: No acute fracture or suspicious osseous lesion. Other: None. CT MAXILLOFACIAL FINDINGS Osseous: No acute fracture or mandibular dislocation. Bilateral TMJ arthropathy with condylar flattening. Orbits: Bilateral cataract extraction. Sinuses: Complete opacification of the right frontal sinus and right anterior and mid ethmoid air cells. Clear mastoid air cells. Soft tissues: Unremarkable. CT CERVICAL SPINE FINDINGS Alignment: Reversal of the normal cervical lordosis. Grade 1 anterolisthesis of C3 on C4, C7 on T1, and T1 on T2. Trace retrolisthesis of C5 on C6. Mild left convex curvature of the cervical spine. Skull base and vertebrae: Moderately advanced median C1-2 arthropathy. No acute fracture or suspicious osseous lesion. Soft tissues and spinal canal: No prevertebral fluid or swelling. No visible canal hematoma. Disc levels: Diffuse disc degeneration, most advanced at C4-5. Advanced multilevel facet arthrosis. No evidence of high-grade spinal canal stenosis. Mild-to-moderate left neural foraminal stenosis at C5-6. Upper chest: Biapical pleuroparenchymal lung scarring. Other: Moderate atherosclerotic calcification at the carotid bifurcations. Critical Value/emergent results  were called by telephone at the time of interpretation on 06/18/2022 at 4:20 pm to Dr. Cyril Loosen, who verbally acknowledged these results. IMPRESSION: 1. Small acute left-sided subdural hematoma without significant mass effect. 2. No acute maxillofacial or cervical spine fracture. Electronically Signed   By: Sebastian Ache M.D.   On: 06/18/2022 16:20   CT Cervical Spine Wo Contrast  Result Date: 06/18/2022 CLINICAL DATA:  Head trauma, minor (Age >= 65y); Facial trauma, blunt; Neck trauma (Age >= 65y). Fall. EXAM: CT HEAD WITHOUT CONTRAST CT MAXILLOFACIAL WITHOUT CONTRAST CT CERVICAL SPINE WITHOUT CONTRAST TECHNIQUE: Multidetector CT imaging of the head, cervical spine, and maxillofacial structures were performed using the standard protocol without intravenous contrast. Multiplanar CT image reconstructions of the cervical spine and maxillofacial structures were also generated. RADIATION DOSE REDUCTION: This exam was performed according to the departmental dose-optimization program which includes automated exposure control, adjustment of the mA and/or kV according to patient size and/or use of iterative reconstruction technique. COMPARISON:  Head MRI 12/05/2014 FINDINGS: CT HEAD FINDINGS Brain: A small acute subdural hematoma over the left cerebral convexity measures up to 5 mm in thickness without significant mass effect. No acute infarct, mass, or midline shift is identified. There is mild cerebral atrophy. Vascular: Calcified atherosclerosis at the skull base. Skull: No acute fracture or suspicious osseous lesion. Other: None. CT MAXILLOFACIAL FINDINGS Osseous: No acute fracture or mandibular dislocation. Bilateral TMJ arthropathy with condylar flattening. Orbits: Bilateral cataract extraction. Sinuses: Complete opacification of the right frontal sinus and right anterior and mid ethmoid air cells. Clear mastoid air cells. Soft  tissues: Unremarkable. CT CERVICAL SPINE FINDINGS Alignment: Reversal of the normal  cervical lordosis. Grade 1 anterolisthesis of C3 on C4, C7 on T1, and T1 on T2. Trace retrolisthesis of C5 on C6. Mild left convex curvature of the cervical spine. Skull base and vertebrae: Moderately advanced median C1-2 arthropathy. No acute fracture or suspicious osseous lesion. Soft tissues and spinal canal: No prevertebral fluid or swelling. No visible canal hematoma. Disc levels: Diffuse disc degeneration, most advanced at C4-5. Advanced multilevel facet arthrosis. No evidence of high-grade spinal canal stenosis. Mild-to-moderate left neural foraminal stenosis at C5-6. Upper chest: Biapical pleuroparenchymal lung scarring. Other: Moderate atherosclerotic calcification at the carotid bifurcations. Critical Value/emergent results were called by telephone at the time of interpretation on 06/18/2022 at 4:20 pm to Dr. Cyril Loosen, who verbally acknowledged these results. IMPRESSION: 1. Small acute left-sided subdural hematoma without significant mass effect. 2. No acute maxillofacial or cervical spine fracture. Electronically Signed   By: Sebastian Ache M.D.   On: 06/18/2022 16:20   CT Maxillofacial Wo Contrast  Result Date: 06/18/2022 CLINICAL DATA:  Head trauma, minor (Age >= 65y); Facial trauma, blunt; Neck trauma (Age >= 65y). Fall. EXAM: CT HEAD WITHOUT CONTRAST CT MAXILLOFACIAL WITHOUT CONTRAST CT CERVICAL SPINE WITHOUT CONTRAST TECHNIQUE: Multidetector CT imaging of the head, cervical spine, and maxillofacial structures were performed using the standard protocol without intravenous contrast. Multiplanar CT image reconstructions of the cervical spine and maxillofacial structures were also generated. RADIATION DOSE REDUCTION: This exam was performed according to the departmental dose-optimization program which includes automated exposure control, adjustment of the mA and/or kV according to patient size and/or use of iterative reconstruction technique. COMPARISON:  Head MRI 12/05/2014 FINDINGS: CT HEAD FINDINGS  Brain: A small acute subdural hematoma over the left cerebral convexity measures up to 5 mm in thickness without significant mass effect. No acute infarct, mass, or midline shift is identified. There is mild cerebral atrophy. Vascular: Calcified atherosclerosis at the skull base. Skull: No acute fracture or suspicious osseous lesion. Other: None. CT MAXILLOFACIAL FINDINGS Osseous: No acute fracture or mandibular dislocation. Bilateral TMJ arthropathy with condylar flattening. Orbits: Bilateral cataract extraction. Sinuses: Complete opacification of the right frontal sinus and right anterior and mid ethmoid air cells. Clear mastoid air cells. Soft tissues: Unremarkable. CT CERVICAL SPINE FINDINGS Alignment: Reversal of the normal cervical lordosis. Grade 1 anterolisthesis of C3 on C4, C7 on T1, and T1 on T2. Trace retrolisthesis of C5 on C6. Mild left convex curvature of the cervical spine. Skull base and vertebrae: Moderately advanced median C1-2 arthropathy. No acute fracture or suspicious osseous lesion. Soft tissues and spinal canal: No prevertebral fluid or swelling. No visible canal hematoma. Disc levels: Diffuse disc degeneration, most advanced at C4-5. Advanced multilevel facet arthrosis. No evidence of high-grade spinal canal stenosis. Mild-to-moderate left neural foraminal stenosis at C5-6. Upper chest: Biapical pleuroparenchymal lung scarring. Other: Moderate atherosclerotic calcification at the carotid bifurcations. Critical Value/emergent results were called by telephone at the time of interpretation on 06/18/2022 at 4:20 pm to Dr. Cyril Loosen, who verbally acknowledged these results. IMPRESSION: 1. Small acute left-sided subdural hematoma without significant mass effect. 2. No acute maxillofacial or cervical spine fracture. Electronically Signed   By: Sebastian Ache M.D.   On: 06/18/2022 16:20   (Echo, Carotid, EGD, Colonoscopy, ERCP)    Subjective: Pt c/o fatigue    Discharge Exam: Vitals:   06/23/22  0516 06/23/22 0717  BP: (!) 153/107 (!) 138/92  Pulse: 76 (!) 57  Resp: 18 16  Temp:  98.7 F (37.1 C) 98.2 F (36.8 C)  SpO2: 95% 96%   Vitals:   06/22/22 1616 06/22/22 2006 06/23/22 0516 06/23/22 0717  BP: 118/80 113/72 (!) 153/107 (!) 138/92  Pulse: 69 98 76 (!) 57  Resp: 16 18 18 16   Temp: 98.8 F (37.1 C) 99.2 F (37.3 C) 98.7 F (37.1 C) 98.2 F (36.8 C)  TempSrc:    Oral  SpO2: 91% 95% 95% 96%  Weight:      Height:        General: Pt is alert, awake, not in acute distress Cardiovascular: S1/S2 +, no rubs, no gallops Respiratory: CTA bilaterally, no wheezing, no rhonchi Abdominal: Soft, NT, ND, bowel sounds + Extremities: no edema, no cyanosis    The results of significant diagnostics from this hospitalization (including imaging, microbiology, ancillary and laboratory) are listed below for reference.     Microbiology: Recent Results (from the past 240 hour(s))  Respiratory (~20 pathogens) panel by PCR     Status: Abnormal   Collection Time: 06/18/22  8:45 PM   Specimen: Nasopharyngeal Swab; Respiratory  Result Value Ref Range Status   Adenovirus NOT DETECTED NOT DETECTED Final   Coronavirus 229E NOT DETECTED NOT DETECTED Final    Comment: (NOTE) The Coronavirus on the Respiratory Panel, DOES NOT test for the novel  Coronavirus (2019 nCoV)    Coronavirus HKU1 NOT DETECTED NOT DETECTED Final   Coronavirus NL63 NOT DETECTED NOT DETECTED Final   Coronavirus OC43 NOT DETECTED NOT DETECTED Final   Metapneumovirus DETECTED (A) NOT DETECTED Final   Rhinovirus / Enterovirus NOT DETECTED NOT DETECTED Final   Influenza A NOT DETECTED NOT DETECTED Final   Influenza B NOT DETECTED NOT DETECTED Final   Parainfluenza Virus 1 NOT DETECTED NOT DETECTED Final   Parainfluenza Virus 2 NOT DETECTED NOT DETECTED Final   Parainfluenza Virus 3 NOT DETECTED NOT DETECTED Final   Parainfluenza Virus 4 NOT DETECTED NOT DETECTED Final   Respiratory Syncytial Virus NOT DETECTED NOT  DETECTED Final   Bordetella pertussis NOT DETECTED NOT DETECTED Final   Bordetella Parapertussis NOT DETECTED NOT DETECTED Final   Chlamydophila pneumoniae NOT DETECTED NOT DETECTED Final   Mycoplasma pneumoniae NOT DETECTED NOT DETECTED Final    Comment: Performed at [facility], [address] [place], [place] [address]  MRSA Next Gen by PCR, Nasal     Status: None   Collection Time: 06/21/22  4:00 PM   Specimen: Nasal Mucosa; Nasal Swab  Result Value Ref Range Status   MRSA by PCR Next Gen NOT DETECTED NOT DETECTED Final    Comment: (NOTE) The GeneXpert MRSA Assay (FDA approved for NASAL specimens only), is one component of a comprehensive MRSA colonization surveillance program. It is not intended to diagnose MRSA infection nor to guide or monitor treatment for MRSA infections. Test performance is not FDA approved in patients less than 50 years old. Performed at [facility], [address] [place], [place] [address]      Labs: BNP (last 3 results) Recent Labs    06/18/22 2047  BNP 171.2*   Basic Metabolic Panel: Recent Labs  Lab 06/18/22 2047 06/19/22 0451 06/20/22 1007 06/21/22 0455 06/21/22 1350 06/22/22 0621 06/22/22 0954 06/22/22 1749 06/22/22 2153 06/23/22 0225 06/23/22 0539 06/23/22 1016  NA 126* 128* 123* 123*   < > 132*   < > 129* 128* 129* 128* 130*  K 3.4* 4.7 3.5 4.2  --  4.2  --   --   --  4.2  --   --   CL 89* 95* 85* 87*  --  97*  --   --   --  96*  --   --   CO2 25 25 28 26   --  25  --   --   --  27  --   --   GLUCOSE 130* 114* 163* 121*  --  119*  --   --   --  115*  --   --   BUN 14 18 13 16   --  22  --   --   --  19  --   --   CREATININE 0.63 0.79 0.60 0.63  --  0.67  --   --   --  0.53  --   --   CALCIUM 9.3 8.6* 9.5 9.3  --  9.2  --   --   --  8.6*  --   --   MG 1.9  --   --   --   --   --   --   --   --   --   --   --    < > = values in this interval not displayed.   Liver Function Tests: Recent Labs  Lab  06/18/22 1528  AST 56*  ALT 43  ALKPHOS 92  BILITOT 0.7  PROT 7.5  ALBUMIN 4.4   No results for input(s): "LIPASE", "AMYLASE" in the last 168 hours. No results for input(s): "AMMONIA" in the last 168 hours. CBC: Recent Labs  Lab 06/18/22 1528 06/19/22 0451 06/23/22 0225  WBC 5.2 5.4 6.4  NEUTROABS 4.0  --   --   HGB 14.3 13.0 13.1  HCT 41.9 37.8 37.6  MCV 93.7 93.6 92.4  PLT 184 166 212   Cardiac Enzymes: No results for input(s): "CKTOTAL", "CKMB", "CKMBINDEX", "TROPONINI" in the last 168 hours. BNP: Invalid input(s): "POCBNP" CBG: Recent Labs  Lab 06/21/22 1552  GLUCAP 105*   D-Dimer No results for input(s): "DDIMER" in the last 72 hours. Hgb A1c No results for input(s): "HGBA1C" in the last 72 hours. Lipid Profile No results for input(s): "CHOL", "HDL", "LDLCALC", "TRIG", "CHOLHDL", "LDLDIRECT" in the last 72 hours. Thyroid function studies No results for input(s): "TSH", "T4TOTAL", "T3FREE", "THYROIDAB" in the last 72 hours.  Invalid input(s): "FREET3" Anemia work up No results for input(s): "VITAMINB12", "FOLATE", "FERRITIN", "TIBC", "IRON", "RETICCTPCT" in the last 72 hours. Urinalysis    Component Value Date/Time   COLORURINE YELLOW (A) 06/18/2022 1528   APPEARANCEUR CLOUDY (A) 06/18/2022 1528   APPEARANCEUR Clear 06/02/2020 1211   LABSPEC 1.019 06/18/2022 1528   PHURINE 5.0 06/18/2022 1528   GLUCOSEU NEGATIVE 06/18/2022 1528   HGBUR SMALL (A) 06/18/2022 1528   BILIRUBINUR NEGATIVE 06/18/2022 1528   BILIRUBINUR Negative 06/02/2020 1211   KETONESUR 5 (A) 06/18/2022 1528   PROTEINUR 30 (A) 06/18/2022 1528   UROBILINOGEN 0.2 07/17/2019 1122   NITRITE NEGATIVE 06/18/2022 1528   LEUKOCYTESUR MODERATE (A) 06/18/2022 1528   Sepsis Labs Recent Labs  Lab 06/18/22 1528 06/19/22 0451 06/23/22 0225  WBC 5.2 5.4 6.4   Microbiology Recent Results (from the past 240 hour(s))  Respiratory (~20 pathogens) panel by PCR     Status: Abnormal   Collection  Time: 06/18/22  8:45 PM   Specimen: Nasopharyngeal Swab; Respiratory  Result Value Ref Range Status   Adenovirus NOT DETECTED NOT DETECTED Final   Coronavirus 229E NOT DETECTED NOT DETECTED Final    Comment: (  NOTE) The Coronavirus on the Respiratory Panel, DOES NOT test for the novel  Coronavirus (2019 nCoV)    Coronavirus HKU1 NOT DETECTED NOT DETECTED Final   Coronavirus NL63 NOT DETECTED NOT DETECTED Final   Coronavirus OC43 NOT DETECTED NOT DETECTED Final   Metapneumovirus DETECTED (A) NOT DETECTED Final   Rhinovirus / Enterovirus NOT DETECTED NOT DETECTED Final   Influenza A NOT DETECTED NOT DETECTED Final   Influenza B NOT DETECTED NOT DETECTED Final   Parainfluenza Virus 1 NOT DETECTED NOT DETECTED Final   Parainfluenza Virus 2 NOT DETECTED NOT DETECTED Final   Parainfluenza Virus 3 NOT DETECTED NOT DETECTED Final   Parainfluenza Virus 4 NOT DETECTED NOT DETECTED Final   Respiratory Syncytial Virus NOT DETECTED NOT DETECTED Final   Bordetella pertussis NOT DETECTED NOT DETECTED Final   Bordetella Parapertussis NOT DETECTED NOT DETECTED Final   Chlamydophila pneumoniae NOT DETECTED NOT DETECTED Final   Mycoplasma pneumoniae NOT DETECTED NOT DETECTED Final    Comment: Performed at [facility], [address] [place], [place] [address]  MRSA Next Gen by PCR, Nasal     Status: None   Collection Time: 06/21/22  4:00 PM   Specimen: Nasal Mucosa; Nasal Swab  Result Value Ref Range Status   MRSA by PCR Next Gen NOT DETECTED NOT DETECTED Final    Comment: (NOTE) The GeneXpert MRSA Assay (FDA approved for NASAL specimens only), is one component of a comprehensive MRSA colonization surveillance program. It is not intended to diagnose MRSA infection nor to guide or monitor treatment for MRSA infections. Test performance is not FDA approved in patients less than 33 years old. Performed at [facility], [address], [place], [place] [address]      Time  coordinating discharge: Over 30 minutes  SIGNED:   Charise Killian, MD  [facility] 06/23/2022, 1:06 PM Pager   If 7PM-7AM, please contact night-coverage [email or web address]

## 2022-06-23 NOTE — TOC Transition Note (Signed)
Transition of Care [facility]) - CM/SW Discharge Note   Patient Details  Name: Laura Carpenter MRN: [id number] Date of Birth: [birth year]  Transition of Care [facility]) CM/SW Contact:  Allena Katz, LCSW Phone Number: 06/23/2022, 1:52 PM   Clinical Narrative:    Pt has orders to discharge to [facility]. RN given number for report. Medical Necessity in. DC summary sent. Tiffany with [employer].    Final next level of care: Skilled Nursing Facility Barriers to Discharge: Barriers Resolved   Patient Goals and CMS Choice   Choice offered to / list presented to : [facility] POA / Guardian  Discharge Placement                  Patient to be transferred to facility by: acems Name of family member notified: Almyra Free Patient and family notified of of transfer: 06/23/22  Discharge Plan and Services Additional resources added to the After Visit Summary for                                       Social Determinants of Health (SDOH) Interventions SDOH Screenings   Food Insecurity: No Food Insecurity (06/22/2022)  Housing: Low Risk  (06/22/2022)  Transportation Needs: No Transportation Needs (06/22/2022)  Utilities: Not At Risk (06/22/2022)  Depression (PHQ2-9): Low Risk  (02/22/2022)  Tobacco Use: Medium Risk (06/18/2022)     Readmission Risk Interventions     No data to display

## 2022-06-27 NOTE — Consult Note (Signed)
[facility] Customer service manager [facility]) Accountable Care Organization (ACO) [facility] Liaison Note  06/27/2022  SHARMA MATHURIN 1935/10/17 [id number]  Location: [facility] RN Hospital Liaison screened the patient remotely at [facility]  Insurance: MCR ACO   Laura Carpenter is a 87 y.o. female who is a Primary Care Patient of Dana Allan, MD ([facility] at [facility]). The patient was screened for readmission hospitalization with noted low risk score for unplanned readmission risk with 1 IP in 6 months.  The patient was assessed for potential [facility]) Care Management service needs for post hospital transition for care coordination. Review of patient's electronic medical record reveals patient was admitted for subdural hematoma. Pt discharged to SNF [facility]). [facility] liaison will collaborate with [facility] PAC-RN for ongoing monitoring at this SNF location.    [facility] Care Management/Population Health does not replace or interfere with any arrangements made by the Inpatient Transition of Care team.   For questions contact:   Elliot Cousin, RN, BSN [facility] Liaison [place]   [facility]  Population Health Office Hours MTWF  8:00 am-6:00 pm Off on Thursday [phone] mobile [phone] [Office toll free line][facility] Office Hours are M-F 8:30 - 5 pm 24 hour nurse advise line [phone] Concierge  [email or web address]

## 2022-06-30 ENCOUNTER — Ambulatory Visit
Admission: RE | Admit: 2022-06-30 | Discharge: 2022-06-30 | Disposition: A | Discharge status: Home / Self Care | Payer: Medicare Other | Source: Ambulatory Visit | Attending: Neurosurgery | Admitting: Neurosurgery

## 2022-06-30 DIAGNOSIS — I609 Nontraumatic subarachnoid hemorrhage, unspecified: Secondary | ICD-10-CM

## 2022-07-02 ENCOUNTER — Emergency Department: Payer: Medicare Other

## 2022-07-02 ENCOUNTER — Encounter (HOSPITAL_COMMUNITY): Payer: Self-pay

## 2022-07-02 ENCOUNTER — Emergency Department
Admission: EM | Admit: 2022-07-02 | Discharge: 2022-07-03 | Disposition: A | Discharge status: Home / Self Care | Payer: Medicare Other | Attending: Emergency Medicine | Admitting: Emergency Medicine

## 2022-07-02 ENCOUNTER — Other Ambulatory Visit: Payer: Self-pay

## 2022-07-02 ENCOUNTER — Encounter: Payer: Self-pay | Admitting: Emergency Medicine

## 2022-07-02 ENCOUNTER — Other Ambulatory Visit: Payer: Self-pay | Admitting: Internal Medicine

## 2022-07-02 DIAGNOSIS — I10 Essential (primary) hypertension: Secondary | ICD-10-CM | POA: Diagnosis not present

## 2022-07-02 DIAGNOSIS — R569 Unspecified convulsions: Secondary | ICD-10-CM | POA: Diagnosis not present

## 2022-07-02 DIAGNOSIS — I4891 Unspecified atrial fibrillation: Secondary | ICD-10-CM | POA: Diagnosis not present

## 2022-07-02 DIAGNOSIS — R109 Unspecified abdominal pain: Secondary | ICD-10-CM | POA: Diagnosis present

## 2022-07-02 DIAGNOSIS — G309 Alzheimer's disease, unspecified: Secondary | ICD-10-CM | POA: Diagnosis not present

## 2022-07-02 DIAGNOSIS — F0280 Dementia in other diseases classified elsewhere without behavioral disturbance: Secondary | ICD-10-CM | POA: Insufficient documentation

## 2022-07-02 DIAGNOSIS — R339 Retention of urine, unspecified: Secondary | ICD-10-CM | POA: Insufficient documentation

## 2022-07-02 DIAGNOSIS — E039 Hypothyroidism, unspecified: Secondary | ICD-10-CM | POA: Diagnosis not present

## 2022-07-02 LAB — COMPREHENSIVE METABOLIC PANEL
ALT: 53 U/L — ABNORMAL HIGH (ref 0–44)
AST: 45 U/L — ABNORMAL HIGH (ref 15–41)
Albumin: 4 g/dL (ref 3.5–5.0)
Alkaline Phosphatase: 179 U/L — ABNORMAL HIGH (ref 38–126)
Anion gap: 11 (ref 5–15)
BUN: 12 mg/dL (ref 8–23)
CO2: 26 mmol/L (ref 22–32)
Calcium: 9.8 mg/dL (ref 8.9–10.3)
Chloride: 93 mmol/L — ABNORMAL LOW (ref 98–111)
Creatinine, Ser: 0.61 mg/dL (ref 0.44–1.00)
GFR, Estimated: >60 mL/min (ref >60)
Glucose, Bld: 115 mg/dL — ABNORMAL HIGH (ref 70–99)
Potassium: 4.4 mmol/L (ref 3.5–5.1)
Sodium: 130 mmol/L — ABNORMAL LOW (ref 135–145)
Total Bilirubin: 1.2 mg/dL (ref 0.3–1.2)
Total Protein: 7.6 g/dL (ref 6.5–8.1)

## 2022-07-02 LAB — URINALYSIS, ROUTINE W REFLEX MICROSCOPIC
Bilirubin Urine: NEGATIVE
Glucose, UA: NEGATIVE mg/dL
Hgb urine dipstick: NEGATIVE
Ketones, ur: NEGATIVE mg/dL
Leukocytes,Ua: NEGATIVE
Nitrite: NEGATIVE
Protein, ur: NEGATIVE mg/dL
Specific Gravity, Urine: 1.009 (ref 1.005–1.030)
pH: 7 (ref 5.0–8.0)

## 2022-07-02 LAB — CBC
HCT: 45.3 % (ref 36.0–46.0)
Hemoglobin: 15.3 g/dL — ABNORMAL HIGH (ref 12.0–15.0)
MCH: 31.9 pg (ref 26.0–34.0)
MCHC: 33.8 g/dL (ref 30.0–36.0)
MCV: 94.4 fL (ref 80.0–100.0)
Platelets: 364 10*3/uL (ref 150–400)
RBC: 4.8 MIL/uL (ref 3.87–5.11)
RDW: 13.2 % (ref 11.5–15.5)
WBC: 9.3 10*3/uL (ref 4.0–10.5)
nRBC: 0 % (ref 0.0–0.2)

## 2022-07-02 LAB — LIPASE, BLOOD: Lipase: 54 U/L — ABNORMAL HIGH (ref 11–51)

## 2022-07-02 MED ORDER — SODIUM CHLORIDE 0.9 % IV BOLUS
500.0000 mL | Freq: Once | INTRAVENOUS | Status: AC
  Administered 2022-07-02: 500 mL via INTRAVENOUS

## 2022-07-02 MED ORDER — LEVETIRACETAM IN NACL 1500 MG/100ML IV SOLN
1500.0000 mg | Freq: Once | INTRAVENOUS | Status: AC
  Administered 2022-07-02: 1500 mg via INTRAVENOUS
  Filled 2022-07-02: qty 100

## 2022-07-02 MED ORDER — SODIUM CHLORIDE 0.9 % IV SOLN: 3000.0000 mg | Freq: Once | INTRAVENOUS | Status: DC

## 2022-07-02 MED ORDER — AMIODARONE HCL IN DEXTROSE 360-4.14 MG/200ML-% IV SOLN
60.0000 mg/h | INTRAVENOUS | Status: AC
  Administered 2022-07-02 (×2): 60 mg/h via INTRAVENOUS
  Filled 2022-07-02 (×2): qty 200

## 2022-07-02 MED ORDER — AMIODARONE LOAD VIA INFUSION
150.0000 mg | Freq: Once | INTRAVENOUS | Status: AC
  Administered 2022-07-02: 150 mg via INTRAVENOUS
  Filled 2022-07-02: qty 83.34

## 2022-07-02 MED ORDER — IOHEXOL 300 MG/ML  SOLN
100.0000 mL | Freq: Once | INTRAMUSCULAR | Status: AC | PRN
  Administered 2022-07-02: 80 mL via INTRAVENOUS

## 2022-07-02 MED ORDER — LORAZEPAM 2 MG/ML IJ SOLN
2.0000 mg | Freq: Once | INTRAMUSCULAR | Status: AC
  Administered 2022-07-02: 2 mg via INTRAVENOUS
  Filled 2022-07-02: qty 1

## 2022-07-02 MED ORDER — AMIODARONE HCL IN DEXTROSE 360-4.14 MG/200ML-% IV SOLN
30.0000 mg/h | INTRAVENOUS | Status: DC
  Administered 2022-07-02: 30 mg/h via INTRAVENOUS

## 2022-07-02 MED ORDER — SODIUM CHLORIDE 0.9 % IV SOLN
750.0000 mg | Freq: Two times a day (BID) | INTRAVENOUS | Status: DC
  Administered 2022-07-02 – 2022-07-03 (×2): 750 mg via INTRAVENOUS
  Filled 2022-07-02 (×3): qty 7.5

## 2022-07-02 NOTE — ED Provider Notes (Addendum)
Sign out received.  HR and BP control for AF RVR prior to [facility] transfer for EEG.  Somnolent now; BP low and HR 100-130s. Trial fluids and ongoing amiodarone and plan is for cardioversion if decompensates or refractory to current treatment. Discussed w Dr Fuller Plan and daughter/POA at bedside for above plan. DNR/DNI.   --- For the past 1 hour her MAPs have held well above 65 and heart rate is now 80s to 100. I discussed the plan for transfer now that heart rate/blood pressure have improved and discussed with family members at bedside including her husband and 2 sons.  Transfer to [facility] re-discussed w Dr Alinda Money after stability of BP/HR and accepted, awaiting [facility] transfer and bed status.  Patient still maintaining airway, somnolent, unchanged mental status exam.    Pilar Jarvis, MD 07/02/22 1705    Pilar Jarvis, MD 07/02/22 [phone]

## 2022-07-02 NOTE — Consult Note (Signed)
NEUROLOGY CONSULTATION NOTE   Date of service: July 02, 2022 Patient Name: Laura Carpenter MRN:  [id number] DOB:  1935/04/12 Reason for consult: seizure Requesting physician: Dr. Artis Delay _ _ _   _ __   _ __ _ _  __ __   _ __   __ _  History of Present Illness   This is a 87 year old woman with past medical history significant for Alzheimer's dementia, chronic hyponatremia, hypertension, hyperlipidemia, thyroid disorder, fibromuscular dysplasia who comes in from [facility] for abdominal pain and distention. She was noted to be altered and to have R sided twitching which is new for her. She was actively having a focal seizure with impaired awareness when I saw her which was aborted with IV ativan. She was recently admitted 06/18/22 after a fall c/b L subdural. Repeat head CT today will review showed primarily isodense subdural hematoma on the left cerebral convexity showing expected interval evolution.  There were no new findings on imaging.  Patient's family at bedside states that she is DNR.   ROS   UTA 2/2 AMS  Past History   I have reviewed the following:  Past Medical History:  Diagnosis Date   Actinic keratosis 05/08/2019   Breast cancer (HCC)    02/17/10: right lumpectomy reveals 0.8 cm of invasive ductal carcinoma with 1/3 LN+ for isolated tumor cell cluster in Oncotype recurrent score 19 or 12% chance of distant recurrence; Femara self-discontinued secondary to side effects. She continues to decline further therapy   Chronic hyponatremia    Hyponatremia for which she has had since somewhere in the mid 1990's. She's had multiple hospitalization, one hospitalization in 1993, she was diagnosed with an EF of 25% as well, though coronaries were clean during that hospitalization. The cath showed an EF of 50%. She is being worked up for her hyponatremia at [facility].   Chronic hyponatremia    Hyponatremia for which she has had since somewhere in the mid 1990's. She's had multiple  hospitalization, one hospitalization in 1993, she was diagnosed with an EF of 25% as well, though coronaries were clean during that hospitalization. The cath showed an EF of 50%. She is being worked up for her hyponatremia at [facility].   Dehydration 06/29/2015   Dementia (HCC)    Dizziness    Dizziness 12/26/2011   Excessive cerumen in both ear canals 06/09/2020   Fall at home 09/24/2015   Fibromuscular dysplasia of renal artery [facility])    1997 Renal angioplasty at [facility]. She subsequently underwent repeat angioplasty a few years later. 12/2009 Abdominal Duplex: <60% bilateral RAS, Sequential narrowing and dilations of renal arteries, suggests fibromuscular dysplasia.    H/O electrolyte imbalance    Hearing loss    Hemorrhoid    History of radiation therapy 2011   Hypertension    Hyponatremia 06/28/2018   Hypothyroidism    Ingrowing nail, left great toe    Ingrowing nail, left great toe 03/25/2014   Non-melanoma skin cancer    right hand   OSA on CPAP    Otalgia of right ear 11/10/2011   Otalgia of right ear 11/10/2011   Polycythemia    Right ankle pain 10/28/2020   Senile dementia of Alzheimer's type (HCC) 06/29/2018   Established [facility]   Skin cancer    squamous cell skin cancer removed by Dermatology--arms and hands   Skin lesion of left leg 05/29/2012   Sleep apnea    Uterine fibroid 06/28/2018   UTI (urinary tract  infection)    Vertigo 12/26/2011   Past Surgical History:  Procedure Laterality Date   ANGIOPLASTY OF RIGHT RENAL ARTERY  05/04/1995   APPENDECTOMY     BREAST LUMPECTOMY Right 2012   performed [facility]    core biopsy breast Right 2012   ultrasound guided core needle biopsy of the right breast reveals invasive ductal carcinoma, ER/PR positive, HER2 neu    FRACTURE SURGERY  2010   right   Family History  Problem Relation Age of Onset   Hypertension Mother    Cancer Mother        ? type lived 47    Alcohol abuse Father        died 22    Cirrhosis  Father    Hypertension Daughter        Almyra Free   Cirrhosis Brother        a lot of health problems died 82s-70s    Hyperlipidemia Son    Social History   Socioeconomic History   Marital status: Married    Spouse name: Not on file   Number of children: Not on file   Years of education: Not on file   Highest education level: Not on file  Occupational History   Not on file  Tobacco Use   Smoking status: Former    Packs/day: 0.25    Years: 20.00    Additional pack years: 0.00    Total pack years: 5.00    Types: Cigarettes   Smokeless tobacco: Former    Quit date: 05/30/1976   Tobacco comments:    early teens; early college smoking Quit: 05/30/1976  Substance and Sexual Activity   Alcohol use: Yes    Alcohol/week: 0.0 standard drinks of alcohol    Comment: occasional social drinker   Drug use: No   Sexual activity: Yes    Partners: Male  Other Topics Concern   Not on file  Social History Narrative   Married x 59 years as of 09/2018    3 kids 1 daughter and 2 sons 5 grands    Homemaker    DPR   1. Husband John [phone]; [phone]   2. Libby [phone]) [phone]    From [place]   Social Determinants of Health   Financial Resource Strain: Not on file  Food Insecurity: No Food Insecurity (06/22/2022)   Hunger Vital Sign    Worried About Running Out of Food in the Last Year: Never true    Ran Out of Food in the Last Year: Never true  Transportation Needs: No Transportation Needs (06/22/2022)   PRAPARE - Administrator, Civil Service (Medical): No    Lack of Transportation (Non-Medical): No  Physical Activity: Not on file  Stress: Not on file  Social Connections: Not on file   Allergies  Allergen Reactions   Aricept [Donepezil Hcl]     GI upset     Medications   (Not in a hospital admission)     Current Facility-Administered Medications:    [COMPLETED] amiodarone (NEXTERONE) 1.8 mg/mL load via infusion 150 mg, 150 mg, Intravenous, Once, 150  mg at 07/02/22 1453 **FOLLOWED BY** amiodarone (NEXTERONE PREMIX) 360-4.14 MG/200ML-% (1.8 mg/mL) IV infusion, 60 mg/hr, Intravenous, Continuous, Last Rate: 33.3 mL/hr at 07/02/22 1453, 60 mg/hr at 07/02/22 1453 **FOLLOWED BY** amiodarone (NEXTERONE PREMIX) 360-4.14 MG/200ML-% (1.8 mg/mL) IV infusion, 30 mg/hr, Intravenous, Continuous, Funke, Mary E, MD   sodium chloride 0.9 % bolus 500 mL, 500  mL, Intravenous, Once, Concha Se, MD  Current Outpatient Medications:    amlodipine-benazepril (LOTREL) 2.5-10 MG capsule, TAKE 1 CAPSULE BY MOUTH ONCE DAILY, Disp: 90 capsule, Rfl: 3   aspirin 81 MG tablet, Take 1 tablet (81 mg total) by mouth daily. Hold aspirin x 1 week as per neuro surg, Disp: 30 tablet, Rfl:    atorvastatin (LIPITOR) 10 MG tablet, TAKE ONE TABLET BY MOUTH EVERY DAY AT 6PM, Disp: 90 tablet, Rfl: 3   Cholecalciferol (VITAMIN D3) 50 MCG (2000 UT) TABS, Take 4,000 Units by mouth daily., Disp: , Rfl:    levothyroxine (SYNTHROID) 125 MCG tablet, Take 1 tablet (125 mcg total) by mouth daily before breakfast., Disp: 90 tablet, Rfl: 3   mirtazapine (REMERON) 7.5 MG tablet, Take 7.5 mg by mouth at bedtime. Take 1 tablet daily, Disp: , Rfl:    rivastigmine (EXELON) 9.5 mg/24hr, , Disp: , Rfl:   Vitals   Vitals:   07/02/22 1500 07/02/22 1515 07/02/22 1530 07/02/22 1540  BP: (!) 71/60 (!) 120/104 100/77 (!) 72/53  Pulse: (!) 101 (!) 112 (!) 109 (!) 115  Resp: 15 (!) 26 19 19   Temp:      TempSrc:      SpO2: 100% 100% 100% 100%  Weight:      Height:         Body mass index is 18.65 kg/m.  Physical Exam   Exam  Vitals:   07/02/22 1530 07/02/22 1540  BP: 100/77 (!) 72/53  Pulse: (!) 109 (!) 115  Resp: 19 19  Temp:    SpO2: 100% 100%    Gen: patient lying in bed, R face and RUE twitching, confused CV: extremities appear well-perfused Resp: normal WOB  Neurologic exam MS: awake but lethargic, oriented to first name only Speech: mild dysarthria, moderate expressive and  receptive aphasia CN: PERRL, VFF, EOMI, sensation intact, face symmetric, hearing intact to voice Motor: withdraws in all extremities more briskly on L Sensory: SILT Coordination: UTA Gait: deferred   Labs   CBC:  Recent Labs  Lab 07/02/22 1041  WBC 9.3  HGB 15.3*  HCT 45.3  MCV 94.4  PLT 364    Basic Metabolic Panel:  Lab Results  Component Value Date   NA 130 (L) 07/02/2022   K 4.4 07/02/2022   CO2 26 07/02/2022   GLUCOSE 115 (H) 07/02/2022   BUN 12 07/02/2022   CREATININE 0.61 07/02/2022   CALCIUM 9.8 07/02/2022   GFRNONAA >60 07/02/2022   GFRAA 27 (L) 06/29/2015   Lipid Panel:  Lab Results  Component Value Date   LDLCALC 73 12/09/2020   HgbA1c:  Lab Results  Component Value Date   HGBA1C 5.4 08/06/2018   Urine Drug Screen: No results found for: "LABOPIA", "COCAINSCRNUR", "LABBENZ", "AMPHETMU", "THCU", "LABBARB"  Alcohol Level No results found for: "ETH"  CT Head without contrast: 1. No new or acute finding. 2. Primarily isodense subdural hematoma on the left cerebral convexity shows expected interval evolution.  Personally reviewed  Impression   This is a 87 year old woman with past medical history significant for Alzheimer's dementia, chronic hyponatremia, hypertension, hyperlipidemia, thyroid disorder, fibromuscular dysplasia, recent admission for fall with L subdural who presented for urinary retention and abd distention who was noted to be in complex partial status with RUE and R face twitching and AMS. Seizure aborted with ativan. 3L urine was emptied via catheter after which she went into a fib with RVR. Currently getting amiodarone. Regarding her seizures, head CT today  is stable and etiology is favored to be recent L subdural. She was loaded with 60mg /kg keppra and started on 750mg  bid. I had a long talk with the family and recommended that she be transferred to [facility] so that we can monitor her on cEEG and titrate her seizure medications  appropriately. This is particularly important bc she is DNR and our options for seizure control, particularly if they persist or generalize, are limited to non-ICU interventions.  Recommendations   - S/p 60mg /kg keppra load, continue 750mg  bid - Transfer to [facility] for cEEG when stabilized ______________________________________________________________________   Thank you for the opportunity to take part in the care of this patient. If you have any further questions, please contact the neurology consultation attending.  Signed,  Bing Neighbors, MD [facility] [phone]  If 7pm- 7am, please page neurology on call as listed in AMION.  **Any copied and pasted documentation in this note was written by me in another application not billed for and pasted by me into this document.

## 2022-07-02 NOTE — ED Provider Notes (Signed)
[facility] Provider Note    Event Date/Time   First MD Initiated Contact with Patient 07/02/22 1315     (approximate)   History   Abdominal Pain   HPI  Laura Carpenter is a 87 y.o. female with history of Alzheimer's dementia, chronic hyponatremia, hypertension, hyperlipidemia, hype hypothyroidism, fibromuscular dysplasia who comes in with concerns for  On review of records patient was admitted on 06/18/2022 after she had had a fall which showed a new left subdural hematoma.  Repeat CT head was stable and no acute interventions.  She had a repeat CT on 6/6 with evolving subdural hematoma that was unchanged in size.  Patient comes in today from [facility] due to abdominal pain and distention.  This was noted on Tuesday or Wednesday patient reportedly normally verbal and oriented but seems to be more altered today.  Family is at bedside who reports that she was acting normal yesterday responsive to questions that she came over today thinking they were going to be able to like have some coffee together when she seemed acutely more confused she has some intermittent twitching of the right side of her head but was still answering questions although somewhat mumbled speech difficult to understand.  Last known normal sounds like was yesterday.  No prior history of seizures.  I do not see that she was on any antiseizure medications.  No known recurrent falls that she has been working with physical therapy.  Physical Exam   Triage Vital Signs: ED Triage Vitals  Enc Vitals Group     BP 07/02/22 1027 (!) 168/102     Pulse Rate 07/02/22 1027 75     Resp 07/02/22 1027 18     Temp 07/02/22 1027 98.5 F (36.9 C)     Temp Source 07/02/22 1027 Oral     SpO2 07/02/22 1027 96 %     Weight 07/02/22 1034 119 lb 0.8 oz (54 kg)     Height 07/02/22 1034 5\' 7"  (1.702 m)     Head Circumference --      Peak Flow --      Pain Score --      Pain Loc --      Pain Edu? --       Excl. in GC? --     Most recent vital signs: Vitals:   07/02/22 1027  BP: (!) 168/102  Pulse: 75  Resp: 18  Temp: 98.5 F (36.9 C)  SpO2: 96%     General: Awake, no distress.  CV:  Good peripheral perfusion.  Resp:  Normal effort.  Abd:  No distention.  Soft nontender.  Foley has been placed Other:  Patient seems confused.  Speech is hard to understand.  She is intermittently twitching on the right side.   ED Results / Procedures / Treatments   Labs (all labs ordered are listed, but only abnormal results are displayed) Labs Reviewed  LIPASE, BLOOD - Abnormal; Notable for the following components:      Result Value   Lipase 54 (*)    All other components within normal limits  COMPREHENSIVE METABOLIC PANEL - Abnormal; Notable for the following components:   Sodium 130 (*)    Chloride 93 (*)    Glucose, Bld 115 (*)    AST 45 (*)    ALT 53 (*)    Alkaline Phosphatase 179 (*)    All other components within normal limits  CBC - Abnormal; Notable for the following  components:   Hemoglobin 15.3 (*)    All other components within normal limits  URINALYSIS, ROUTINE W REFLEX MICROSCOPIC - Abnormal; Notable for the following components:   Color, Urine YELLOW (*)    APPearance CLEAR (*)    All other components within normal limits      RADIOLOGY I have reviewed the CT personally interpreted patient has significant bladder distention   PROCEDURES:  Critical Care performed: No  .1-3 Lead EKG Interpretation  Performed by: Concha Se, MD Authorized by: Concha Se, MD     Interpretation: abnormal     ECG rate:  140   ECG rate assessment: tachycardic     Rhythm: atrial fibrillation     Ectopy: none     Conduction: normal   .Critical Care  Performed by: Concha Se, MD Authorized by: Concha Se, MD   Critical care provider statement:    Critical care time (minutes):  30   Critical care was necessary to treat or prevent imminent or life-threatening  deterioration of the following conditions:  CNS failure or compromise and shock   Critical care was time spent personally by me on the following activities:  Development of treatment plan with patient or surrogate, discussions with consultants, evaluation of patient's response to treatment, examination of patient, ordering and review of laboratory studies, ordering and review of radiographic studies, ordering and performing treatments and interventions, pulse oximetry, re-evaluation of patient's condition and review of old charts    MEDICATIONS ORDERED IN ED: Medications  amiodarone (NEXTERONE) 1.8 mg/mL load via infusion 150 mg (150 mg Intravenous Bolus from Bag 07/02/22 1453)    Followed by  amiodarone (NEXTERONE PREMIX) 360-4.14 MG/200ML-% (1.8 mg/mL) IV infusion (60 mg/hr Intravenous New Bag/Given 07/02/22 1453)    Followed by  amiodarone (NEXTERONE PREMIX) 360-4.14 MG/200ML-% (1.8 mg/mL) IV infusion (has no administration in time range)  iohexol (OMNIPAQUE) 300 MG/ML solution 100 mL (80 mLs Intravenous Contrast Given 07/02/22 1150)  LORazepam (ATIVAN) injection 2 mg (2 mg Intravenous Given 07/02/22 1335)  levETIRAcetam (KEPPRA) IVPB 1500 mg/ 100 mL premix (0 mg Intravenous Stopped 07/02/22 1459)    Followed by  levETIRAcetam (KEPPRA) IVPB 1500 mg/ 100 mL premix (0 mg Intravenous Stopped 07/02/22 1459)     IMPRESSION / MDM / ASSESSMENT AND PLAN / ED COURSE  I reviewed the triage vital signs and the nursing notes.   Patient's presentation is most consistent with acute presentation with potential threat to life or bodily function.   Patient comes in with altered mental status after recent subdural back on 525.  CT head ordered to ensure not progressing and it was stable.  No new recurrent falls.  She does have significantly distended bladder and Foley catheter was placed in triage.  She is difficult to get to follow commands to do good neurological exam of her legs to make sure no cord compression I  wonder if patient could be having some partial seizures.  Will discuss with neurology  1:45 PM D/w Dr Selina Cooley who came and evaluated patient and recommends transfer to [facility] due to concern for partial seizures.  Patient given 2 of IV Ativan   Family does have a DNR form and they would not want intubation.  They are willing to transfer patient.  Patient ordered Keppra.  After the Ativan patient desatted down to 94% and has some snoring.  Patient placed on 1-1/2 L of oxygen.  2:19 PM patient noted to go into A-fib with RVR  significantly hypotensive to the 50s while-initially discussed with the POA the daughter about goals of care she did not think that she would NOT want a cardioversion but we called with 2 additional family members on the line about whether or not cardioversion was something they would be interested and given new onset A-fib with RVR with hypotension.  They initially were hesitant but would be willing to do it if it would help prolong life because they are on their way here.  I have bolused 2 L of fluids given I suspect this is related to hypotension from dumping from her Foley catheter that was just placed which then made her go into A-fib with RVR.  Triage nurse after Foley catheter was placed she initially dumped 2 L of urine and then additional 1 L since being back in the room we have given her 2 L of fluids and blood pressures have come up.  I again discussed with patient's family given her blood pressures are now elevated and she is still in A-fib with RVR we could trial some amiodarone.  They prefer to do this over cardioversion given patient's goals of care.  Amnio bolus was infused and patient got hypotensive.  I have discussed the case with the [facility] hospitalist so they can be aware of the patient obviously we did get patient more stable for transfer and will update them but wanted to give them the situation so far I discussed case with Dr. Alinda Money  3:30 PM reevaluated  patient heart rates have come down blood pressures are still slightly soft.  Discussed again with family given some improvements on the amnioinfusion they would like to hold off on cardioversion given patient is DNR and the risk for coding afterwards they would like to see if family can get here first before making decisions.  Currently her maps are overall reassuring and she seems to respond to fluids so we will give some additional fluids.  She still is sleepy from the medications given earlier.  I have discussed and evaluated the patient with Dr. Modesto Charon who is agreeable with this plans and further discussion with family and the transfer center once patient is medically stable   The patient is on the cardiac monitor to evaluate for evidence of arrhythmia and/or significant heart rate changes.      FINAL CLINICAL IMPRESSION(S) / ED DIAGNOSES   Final diagnoses:  Seizures (HCC)  Urinary retention  Atrial fibrillation with rapid ventricular response (HCC)     Rx / DC Orders   ED Discharge Orders     None        Note:  This document was prepared using Dragon voice recognition software and may include unintentional dictation errors.   Concha Se, MD 07/02/22 [phone]

## 2022-07-02 NOTE — ED Notes (Signed)
Bladder scan performed shows >922ml in bladder.  Order for foley catheter obtained.

## 2022-07-02 NOTE — ED Notes (Addendum)
This RN was notified by [facility], EDT that pt was having jerking movements. Pt was noted by this RN to be experiencing jerking movements, pt eyes remained open during movement and patient able to track nurse with her eye. Charge RN made aware and room assigned to pt.

## 2022-07-02 NOTE — ED Triage Notes (Addendum)
Pt to ER via EMS from [facility] with report of abdominal pain and distention.  Pt's daughter states abdominal distention first noted on Tuesday or Wednesday.  States she had an xray at that time, but is unsure of results.  States pt had previous hospitalization after a fall and had several complications during that time.  States she is normally verbal and oriented to person and place, but today is non verbal to staff and family.  Pt has severely distended abdomen that is tender to palpation.

## 2022-07-02 NOTE — Progress Notes (Signed)
Plan of Care Note for accepted transfer   Patient: Laura Carpenter MRN: [id number]   DOA: (Not on file)  Facility requesting transfer: [facility] Requesting Provider: Dr. Fuller Plan, then Dr. Modesto Charon. [facility] ED. Reason for transfer: Services not available Facility course:   Patient presented with abdominal pain and altered mental status from facility.  Has had abdominal pain for 3 to 4 days with x-ray performed but unsure of results.  Family reports that at baseline she is alert and oriented to person and place in the setting of her chronic dementia.  But recently has been more nonverbal.  She was recently admitted to [facility] for subdural hematoma which was stable on repeat imaging, hyponatremia, urinary retention.  Noticed to have some facial twitching in the ED.  Neurology consulted and concern for partial seizure especially considering her altered mental status.  Plan to transfer to [facility] for continuous EEG monitoring.  Before patient could be be transferred after initial request.  Patient developed hypotension and atrial fibrillation with RVR after catheterization for retained urine volume of greater than a liter.   Patient started on amiodarone and received 2 L of IV fluids with improvement in heart rate and blood pressure.  Was able to maintain appropriate blood pressure, maps, heart rate for over hour and a half prior to repeat request for transfer.  Plan of care: The patient is accepted for admission to Progressive unit, at [facility]..   Please notify neurology of patient arrival.  Author: Synetta Fail, MD 07/02/2022  Check [email or web address] for on-call coverage.  Nursing staff, Please call [facility] Admits & Consults System-Wide number on Amion as soon as patient's arrival, so appropriate admitting provider can evaluate the pt.

## 2022-07-03 ENCOUNTER — Other Ambulatory Visit: Payer: Self-pay

## 2022-07-03 ENCOUNTER — Observation Stay (HOSPITAL_COMMUNITY): Payer: Medicare Other

## 2022-07-03 ENCOUNTER — Encounter (HOSPITAL_COMMUNITY): Payer: Self-pay | Admitting: Internal Medicine

## 2022-07-03 ENCOUNTER — Inpatient Hospital Stay (HOSPITAL_COMMUNITY)
Admission: RE | Admit: 2022-07-03 | Discharge: 2022-07-14 | DRG: 100 | Disposition: A | Discharge status: Hospice | Payer: Medicare Other | Source: Ambulatory Visit | Attending: Internal Medicine | Admitting: Internal Medicine

## 2022-07-03 DIAGNOSIS — E222 Syndrome of inappropriate secretion of antidiuretic hormone: Secondary | ICD-10-CM | POA: Diagnosis not present

## 2022-07-03 DIAGNOSIS — S065XAA Traumatic subdural hemorrhage with loss of consciousness status unknown, initial encounter: Secondary | ICD-10-CM | POA: Diagnosis not present

## 2022-07-03 DIAGNOSIS — J690 Pneumonitis due to inhalation of food and vomit: Secondary | ICD-10-CM | POA: Diagnosis not present

## 2022-07-03 DIAGNOSIS — R339 Retention of urine, unspecified: Secondary | ICD-10-CM | POA: Diagnosis present

## 2022-07-03 DIAGNOSIS — Z79899 Other long term (current) drug therapy: Secondary | ICD-10-CM | POA: Diagnosis not present

## 2022-07-03 DIAGNOSIS — I480 Paroxysmal atrial fibrillation: Secondary | ICD-10-CM | POA: Diagnosis present

## 2022-07-03 DIAGNOSIS — E43 Unspecified severe protein-calorie malnutrition: Secondary | ICD-10-CM | POA: Diagnosis present

## 2022-07-03 DIAGNOSIS — Z79890 Hormone replacement therapy (postmenopausal): Secondary | ICD-10-CM | POA: Diagnosis not present

## 2022-07-03 DIAGNOSIS — Z515 Encounter for palliative care: Secondary | ICD-10-CM | POA: Diagnosis not present

## 2022-07-03 DIAGNOSIS — Z681 Body mass index (BMI) 19 or less, adult: Secondary | ICD-10-CM

## 2022-07-03 DIAGNOSIS — I10 Essential (primary) hypertension: Secondary | ICD-10-CM | POA: Diagnosis present

## 2022-07-03 DIAGNOSIS — E8809 Other disorders of plasma-protein metabolism, not elsewhere classified: Secondary | ICD-10-CM | POA: Diagnosis present

## 2022-07-03 DIAGNOSIS — R338 Other retention of urine: Secondary | ICD-10-CM | POA: Insufficient documentation

## 2022-07-03 DIAGNOSIS — Z8679 Personal history of other diseases of the circulatory system: Secondary | ICD-10-CM

## 2022-07-03 DIAGNOSIS — G9341 Metabolic encephalopathy: Secondary | ICD-10-CM | POA: Diagnosis present

## 2022-07-03 DIAGNOSIS — Z85828 Personal history of other malignant neoplasm of skin: Secondary | ICD-10-CM

## 2022-07-03 DIAGNOSIS — Z923 Personal history of irradiation: Secondary | ICD-10-CM | POA: Diagnosis not present

## 2022-07-03 DIAGNOSIS — Z66 Do not resuscitate: Secondary | ICD-10-CM | POA: Diagnosis present

## 2022-07-03 DIAGNOSIS — R569 Unspecified convulsions: Secondary | ICD-10-CM | POA: Diagnosis not present

## 2022-07-03 DIAGNOSIS — G4733 Obstructive sleep apnea (adult) (pediatric): Secondary | ICD-10-CM | POA: Diagnosis present

## 2022-07-03 DIAGNOSIS — E876 Hypokalemia: Secondary | ICD-10-CM | POA: Insufficient documentation

## 2022-07-03 DIAGNOSIS — F02B0 Dementia in other diseases classified elsewhere, moderate, without behavioral disturbance, psychotic disturbance, mood disturbance, and anxiety: Secondary | ICD-10-CM | POA: Diagnosis not present

## 2022-07-03 DIAGNOSIS — F0280 Dementia in other diseases classified elsewhere without behavioral disturbance: Secondary | ICD-10-CM | POA: Diagnosis present

## 2022-07-03 DIAGNOSIS — E860 Dehydration: Secondary | ICD-10-CM | POA: Diagnosis present

## 2022-07-03 DIAGNOSIS — F02C0 Dementia in other diseases classified elsewhere, severe, without behavioral disturbance, psychotic disturbance, mood disturbance, and anxiety: Secondary | ICD-10-CM | POA: Diagnosis not present

## 2022-07-03 DIAGNOSIS — G40001 Localization-related (focal) (partial) idiopathic epilepsy and epileptic syndromes with seizures of localized onset, not intractable, with status epilepticus: Secondary | ICD-10-CM | POA: Diagnosis not present

## 2022-07-03 DIAGNOSIS — Z87891 Personal history of nicotine dependence: Secondary | ICD-10-CM

## 2022-07-03 DIAGNOSIS — E039 Hypothyroidism, unspecified: Secondary | ICD-10-CM | POA: Diagnosis present

## 2022-07-03 DIAGNOSIS — R1312 Dysphagia, oropharyngeal phase: Secondary | ICD-10-CM | POA: Diagnosis present

## 2022-07-03 DIAGNOSIS — I4891 Unspecified atrial fibrillation: Secondary | ICD-10-CM | POA: Diagnosis not present

## 2022-07-03 DIAGNOSIS — J189 Pneumonia, unspecified organism: Secondary | ICD-10-CM | POA: Insufficient documentation

## 2022-07-03 DIAGNOSIS — Z888 Allergy status to other drugs, medicaments and biological substances status: Secondary | ICD-10-CM

## 2022-07-03 DIAGNOSIS — Z8249 Family history of ischemic heart disease and other diseases of the circulatory system: Secondary | ICD-10-CM

## 2022-07-03 DIAGNOSIS — E871 Hypo-osmolality and hyponatremia: Secondary | ICD-10-CM | POA: Diagnosis present

## 2022-07-03 DIAGNOSIS — R627 Adult failure to thrive: Secondary | ICD-10-CM | POA: Diagnosis not present

## 2022-07-03 DIAGNOSIS — E782 Mixed hyperlipidemia: Secondary | ICD-10-CM | POA: Diagnosis present

## 2022-07-03 DIAGNOSIS — G301 Alzheimer's disease with late onset: Secondary | ICD-10-CM | POA: Diagnosis present

## 2022-07-03 DIAGNOSIS — F0390 Unspecified dementia without behavioral disturbance: Secondary | ICD-10-CM | POA: Diagnosis present

## 2022-07-03 DIAGNOSIS — Z7982 Long term (current) use of aspirin: Secondary | ICD-10-CM

## 2022-07-03 DIAGNOSIS — Z853 Personal history of malignant neoplasm of breast: Secondary | ICD-10-CM

## 2022-07-03 DIAGNOSIS — F802 Mixed receptive-expressive language disorder: Secondary | ICD-10-CM | POA: Diagnosis present

## 2022-07-03 DIAGNOSIS — R7989 Other specified abnormal findings of blood chemistry: Secondary | ICD-10-CM | POA: Diagnosis present

## 2022-07-03 LAB — COMPREHENSIVE METABOLIC PANEL
ALT: 40 U/L (ref 0–44)
AST: 37 U/L (ref 15–41)
Albumin: 3.4 g/dL — ABNORMAL LOW (ref 3.5–5.0)
Alkaline Phosphatase: 162 U/L — ABNORMAL HIGH (ref 38–126)
Anion gap: 13 (ref 5–15)
BUN: 9 mg/dL (ref 8–23)
CO2: 25 mmol/L (ref 22–32)
Calcium: 9.4 mg/dL (ref 8.9–10.3)
Chloride: 94 mmol/L — ABNORMAL LOW (ref 98–111)
Creatinine, Ser: 0.72 mg/dL (ref 0.44–1.00)
GFR, Estimated: >60 mL/min (ref >60)
Glucose, Bld: 104 mg/dL — ABNORMAL HIGH (ref 70–99)
Potassium: 3.7 mmol/L (ref 3.5–5.1)
Sodium: 132 mmol/L — ABNORMAL LOW (ref 135–145)
Total Bilirubin: 1 mg/dL (ref 0.3–1.2)
Total Protein: 6.5 g/dL (ref 6.5–8.1)

## 2022-07-03 LAB — CBC
HCT: 41.2 % (ref 36.0–46.0)
Hemoglobin: 13.9 g/dL (ref 12.0–15.0)
MCH: 32.6 pg (ref 26.0–34.0)
MCHC: 33.7 g/dL (ref 30.0–36.0)
MCV: 96.7 fL (ref 80.0–100.0)
Platelets: 363 10*3/uL (ref 150–400)
RBC: 4.26 MIL/uL (ref 3.87–5.11)
RDW: 13.5 % (ref 11.5–15.5)
WBC: 14.4 10*3/uL — ABNORMAL HIGH (ref 4.0–10.5)
nRBC: 0 % (ref 0.0–0.2)

## 2022-07-03 MED ORDER — MIRTAZAPINE 15 MG PO TABS
7.5000 mg | ORAL_TABLET | Freq: Every day | ORAL | Status: DC
  Administered 2022-07-03 – 2022-07-13 (×10): 7.5 mg via ORAL
  Filled 2022-07-03 (×10): qty 1

## 2022-07-03 MED ORDER — ONDANSETRON HCL 4 MG PO TABS: 4.0000 mg | ORAL_TABLET | Freq: Four times a day (QID) | ORAL | Status: DC | PRN

## 2022-07-03 MED ORDER — KCL IN DEXTROSE-NACL 20-5-0.9 MEQ/L-%-% IV SOLN
INTRAVENOUS | Status: DC
  Filled 2022-07-03 (×2): qty 1000

## 2022-07-03 MED ORDER — SODIUM CHLORIDE 0.9 % IV SOLN
750.0000 mg | Freq: Two times a day (BID) | INTRAVENOUS | Status: DC
  Administered 2022-07-03 – 2022-07-05 (×4): 750 mg via INTRAVENOUS
  Filled 2022-07-03 (×5): qty 7.5

## 2022-07-03 MED ORDER — ACETAMINOPHEN 325 MG PO TABS: 650.0000 mg | ORAL_TABLET | Freq: Four times a day (QID) | ORAL | Status: DC | PRN

## 2022-07-03 MED ORDER — ACETAMINOPHEN 650 MG RE SUPP: 650.0000 mg | Freq: Four times a day (QID) | RECTAL | Status: DC | PRN

## 2022-07-03 MED ORDER — ONDANSETRON HCL 4 MG/2ML IJ SOLN: 4.0000 mg | Freq: Four times a day (QID) | INTRAMUSCULAR | Status: DC | PRN

## 2022-07-03 MED ORDER — RIVASTIGMINE 9.5 MG/24HR TD PT24
9.5000 mg | MEDICATED_PATCH | Freq: Every day | TRANSDERMAL | Status: DC
  Administered 2022-07-03 – 2022-07-14 (×12): 9.5 mg via TRANSDERMAL
  Filled 2022-07-03 (×13): qty 1

## 2022-07-03 MED ORDER — ATORVASTATIN CALCIUM 10 MG PO TABS
10.0000 mg | ORAL_TABLET | Freq: Every day | ORAL | Status: DC
  Administered 2022-07-05 – 2022-07-11 (×7): 10 mg via ORAL
  Filled 2022-07-03 (×7): qty 1

## 2022-07-03 MED ORDER — LORAZEPAM 2 MG/ML IJ SOLN
0.5000 mg | Freq: Once | INTRAMUSCULAR | Status: AC
  Administered 2022-07-03: 0.5 mg via INTRAVENOUS
  Filled 2022-07-03 (×2): qty 1

## 2022-07-03 MED ORDER — LEVOTHYROXINE SODIUM 25 MCG PO TABS
125.0000 ug | ORAL_TABLET | Freq: Every day | ORAL | Status: DC
  Administered 2022-07-04 – 2022-07-14 (×11): 125 ug via ORAL
  Filled 2022-07-03 (×11): qty 1

## 2022-07-03 MED ORDER — LABETALOL HCL 5 MG/ML IV SOLN
10.0000 mg | Freq: Four times a day (QID) | INTRAVENOUS | Status: DC | PRN
  Administered 2022-07-11 – 2022-07-13 (×2): 10 mg via INTRAVENOUS
  Filled 2022-07-03 (×2): qty 4

## 2022-07-03 NOTE — ED Notes (Signed)
Patient still does not have a bed available for transfer at [facility].  Patient was moved off of her stretcher and onto a hospital bed for comfort.  During the moved, she did open her eyes and look around the room.  She was not vocal, and made no utterances.  She looked around, and closed her eyes.  She again opened her eyes and look around after the lights were dimmed in her room.  This is the first activity seen from her by this nurse.  She has been snoring with her eyes closed during all other visits to the room prior to this visit.

## 2022-07-03 NOTE — Assessment & Plan Note (Signed)
This is new, very mild.  CT imaging of liver unremarkable. - Trend LFTs for now

## 2022-07-03 NOTE — ED Provider Notes (Signed)
Patient's blood pressure is improved, repeat EKG demonstrates sinus rhythm, heart rate 58.  Will d/c amio drip   Jene Every, MD 07/03/22 [phone]

## 2022-07-03 NOTE — Assessment & Plan Note (Addendum)
Noted to have belly distension on arrival to ER (this was actually the chief complaint).  Foley placed and >1L urine output.    Here, trialed off foley for last 24 hours and unable to void at all, requiring repeat I/O caths.   - Continue Flomax - Replace foley

## 2022-07-03 NOTE — Assessment & Plan Note (Signed)
TSH normal recently - Continue levothyroxine 

## 2022-07-03 NOTE — Assessment & Plan Note (Signed)
New onset in ER after placement of foley, patient developed Afib and hypotension.  No prior history of same.  She was started on amiodarone briefly, and given fluids and her BP improved and she converted back to sinus.  Has been in and out of sinus rhythm here. - No anticoagulation   - Continue amiodarone

## 2022-07-03 NOTE — Progress Notes (Signed)
Neurology progress note  S: RUE and R face twitching has resolved, patient still aphasic and confused. She will be transferred to [facility] shortly for cEEG.  O:  Vitals:   07/03/22 1407 07/03/22 1413  BP:  (!) 167/99  Pulse:  95  Resp:  (!) 21  Temp: 98.2 F (36.8 C) 98.2 F (36.8 C)  SpO2:  92%   Gen: patient lying in bed, confused CV: extremities appear well-perfused Resp: normal WOB   Neurologic exam MS: awake but lethargic, oriented to first name only Speech: mild dysarthria, moderate expressive and receptive aphasia CN: PERRL, VFF, EOMI, sensation intact, face symmetric, hearing intact to voice Motor: withdraws in all extremities more briskly on L Sensory: SILT Coordination: UTA Gait: deferred  CT Head without contrast: 1. No new or acute finding. 2. Primarily isodense subdural hematoma on the left cerebral convexity shows expected interval evolution.   Personally reviewed  A/P: This is a 87 year old woman with past medical history significant for Alzheimer's dementia, chronic hyponatremia, hypertension, hyperlipidemia, thyroid disorder, fibromuscular dysplasia, recent admission for fall with L subdural who presented for urinary retention and abd distention who was noted to be in complex partial status with RUE and R face twitching and AMS. Seizure aborted with ativan. 3L urine was emptied via catheter after which she went into a fib with RVR which has now been stabilized. Regarding her seizures, head CT today is stable and etiology is favored to be recent L subdural. She was loaded with 60mg /kg keppra and started on 750mg  bid. I had a long talk with the family and recommended that she be transferred to [facility] so that we can monitor her on cEEG and titrate her seizure medications appropriately. This is particularly important bc she is DNR and our options for seizure control, particularly if they persist or generalize, are limited to non-ICU interventions. She remains at [facility] awaiting  transfer but a bed is now available and she will be transferred shortly. She currently is no longer twitching but has persistent aphasia and AMS.  - Transfer to [facility] for cEEG, please notify neurology on arrival  Bing Neighbors, MD [facility] [phone]  If 7pm- 7am, please page neurology on call as listed in AMION.

## 2022-07-03 NOTE — Assessment & Plan Note (Signed)
-  Continue Lipitor °

## 2022-07-03 NOTE — Assessment & Plan Note (Addendum)
Na baseline has fluctuated throughout the years, as low as 130s at time, recently higher.  Now trending down to 126 in the last week.    - Continue  - Start fluid restriction - Check urine studies - Continue salt tabs

## 2022-07-03 NOTE — Hospital Course (Addendum)
Mrs. Montecalvo is an 87 y.o. F with dementia, HTN, HLD, hypothyroidism and recently admitted for fall/SDH and hyponatremia requiring 3% saline to [facility] about 3 weeks ago, discharged to SNF who returned for decreased mentation and belly distension.  Since discharge to SNF after her SDH, patient was improving per family.  They noted nothing wrong until the day of admission, daughter arrived and found her weak, poorly responsive, and with belly distension.    In the ER, [facility] there showed expected evolution of SDH.  She was observed to have repeated twitching behaviors, Neurology were consulted, and suspected partial seizures.  She was loaded with Keppra.  CT abdomen showed dilstended bladder, and after placement of a foley resulting in >1L urine output, the patient developed hypotension and atrial fibrillation, which stabilized with amiodarone and IV fluids.

## 2022-07-03 NOTE — Assessment & Plan Note (Addendum)
-   Continue Keppra, change to oral

## 2022-07-03 NOTE — Assessment & Plan Note (Signed)
BP soft - Hold amlodipine and benazepril

## 2022-07-03 NOTE — Progress Notes (Signed)
LTM EEG hooked up and running - no initial skin breakdown - push button tested - [facility] monitoring.  

## 2022-07-03 NOTE — ED Notes (Signed)
This RN assessed patient and notified MD about HR being 50-60 and being on amiodarone. Awaiting new orders.

## 2022-07-03 NOTE — ED Notes (Signed)
Pts family asked to hold the ativan for now

## 2022-07-03 NOTE — ED Notes (Signed)
EMTALA review by this RN 

## 2022-07-03 NOTE — Plan of Care (Signed)
Neurology Plan of care:   Brief HPI: 87 year old woman with past medical history significant for Alzheimer's dementia, chronic hyponatremia, hypertension, hyperlipidemia, thyroid disorder, fibromuscular dysplasia who comes in [facility] for abdominal pain and distention. Recent admission for SDH, hyponatremia and urinary retention. She was noted to be altered and to have R sided twitching which is new for her. She was actively having a focal seizure with impaired awareness which was aborted with IV ativan. She was loaded with 60mg /kg keppra and started on 750mg  bid. She is DNR  S: Her daughter is at the bedside.  Patient is laying in bed in no apparent distress.  Is very lethargic.  Daughter states she received some Ativan prior to transport from [facility].  Daughter states has not seen any seizure-like activity since this morning  Assessment/Plan  Temp:  [97.6 F (36.4 C)-98.6 F (37 C)] 98.6 F (37 C) (06/09 1512) Pulse Rate:  [50-98] 63 (06/09 1512) Resp:  [14-27] 18 (06/09 1512) BP: (94-180)/(68-123) 180/102 (06/09 1512) SpO2:  [83 %-100 %] 83 % (06/09 1512)  General -critically ill elderly woman  Mental Status -  She is very lethargic, eyes are closed, does attempt to open eyes to voice and noxious stimuli however eyes do not remain open.  She does not answer any orientation questions.  Poor attention and needs constant stimulation.  She does not follow commands.  Speech is dysarthric with nonsensical speech, face appears symmetric.  Eyes are midline, PERRL, blinks to threat bilaterally.  Moves bilateral upper extremities spontaneously, bilateral lowers withdraws to pain No twitching or seizure like activity on exam   Focal seizures  - seizure precautions  - Will obtain LTM overnight  - continue Keppra 750mg  BID  - Obtain MRI brain after LTM  - Neurology will continue to follow   Gevena Mart DNP, ACNPC-AG  [facility]

## 2022-07-03 NOTE — Assessment & Plan Note (Signed)
At baseline is ambulatory, conversational, with just some mild short term memory deficits. - Continue rivastigmine

## 2022-07-03 NOTE — Assessment & Plan Note (Signed)
Left frontal convexity, 6mm.  CT this admission shows expected interval evolution, no worsening.

## 2022-07-03 NOTE — Assessment & Plan Note (Addendum)
Here, she is somnolent, and not oriented to person, place or time, and poorly responsive.  Presumably this is post-ictal.  Recent TSH, cortisol and B12 all normal.  Ammonia not high enough to explain this.  No focal signs of infection.  Some persistent delirium is present.

## 2022-07-03 NOTE — ED Notes (Signed)
Called [facility] to see if they [facility] had a ready bed yet for patient per Cala Bradford still do not have a bed yet

## 2022-07-03 NOTE — H&P (Signed)
History and Physical    Patient: Laura Carpenter UJW:[id number] DOB: Nov 29, 1935 DOA: 07/03/2022 DOS: the patient was seen and examined on 07/03/2022 PCP: Dana Allan, MD  Patient coming from: SNF [facility], acute rehab  Chief Complaint: Seizures      HPI:  Laura Carpenter is an 86 y.o. F with dementia, HTN, HLD, hypothyroidism and recently admitted for fall/SDH and hyponatremia requiring 3% saline to [facility] about 3 weeks ago, discharged to SNF who returned for decreased mentation and belly distension.  Since discharge to SNF after her SDH, patient was improving per family.  They noted nothing wrong until the day of admission, daughter arrived and found her weak, poorly responsive, and with belly distension.    In the ER, CTH there showed expected evolution of SDH.  She was observed to have repeated twitching behaviors, Neurology were consulted, and suspected partial seizures.  She was loaded with Keppra.  CT abdomen showed dilstended bladder, and after placement of a foley resulting in >1L urine output, the patient developed hypotension and atrial fibrillation, which stabilized with amiodarone and IV fluids.      Review of Systems  Unable to perform ROS: Patient unresponsive     Past Medical History:  Diagnosis Date   Actinic keratosis 05/08/2019   Breast cancer (HCC)    02/17/10: right lumpectomy reveals 0.8 cm of invasive ductal carcinoma with 1/3 LN+ for isolated tumor cell cluster in Oncotype recurrent score 19 or 12% chance of distant recurrence; Femara self-discontinued secondary to side effects. She continues to decline further therapy   Chronic hyponatremia    Hyponatremia for which she has had since somewhere in the mid 1990's. She's had multiple hospitalization, one hospitalization in 1993, she was diagnosed with an EF of 25% as well, though coronaries were clean during that hospitalization. The cath showed an EF of 50%. She is being worked up for her hyponatremia at [facility].    Chronic hyponatremia    Hyponatremia for which she has had since somewhere in the mid 1990's. She's had multiple hospitalization, one hospitalization in 1993, she was diagnosed with an EF of 25% as well, though coronaries were clean during that hospitalization. The cath showed an EF of 50%. She is being worked up for her hyponatremia at [facility].   Dehydration 06/29/2015   Dementia (HCC)    Dizziness    Dizziness 12/26/2011   Excessive cerumen in both ear canals 06/09/2020   Fall at home 09/24/2015   Fibromuscular dysplasia of renal artery [facility])    1997 Renal angioplasty at [facility]. She subsequently underwent repeat angioplasty a few years later. 12/2009 Abdominal Duplex: <60% bilateral RAS, Sequential narrowing and dilations of renal arteries, suggests fibromuscular dysplasia.    H/O electrolyte imbalance    Hearing loss    Hemorrhoid    History of radiation therapy 2011   Hypertension    Hyponatremia 06/28/2018   Hypothyroidism    Ingrowing nail, left great toe    Ingrowing nail, left great toe 03/25/2014   Non-melanoma skin cancer    right hand   OSA on CPAP    Otalgia of right ear 11/10/2011   Otalgia of right ear 11/10/2011   Polycythemia    Right ankle pain 10/28/2020   Senile dementia of Alzheimer's type (HCC) 06/29/2018   Established [facility]   Skin cancer    squamous cell skin cancer removed by Dermatology--arms and hands   Skin lesion of left leg 05/29/2012   Sleep apnea  Uterine fibroid 06/28/2018   UTI (urinary tract infection)    Vertigo 12/26/2011   Past Surgical History:  Procedure Laterality Date   ANGIOPLASTY OF RIGHT RENAL ARTERY  05/04/1995   APPENDECTOMY     BREAST LUMPECTOMY Right 2012   performed [facility]    core biopsy breast Right 2012   ultrasound guided core needle biopsy of the right breast reveals invasive ductal carcinoma, ER/PR positive, HER2 neu    FRACTURE SURGERY  2010   right   Social History:  reports that she has quit smoking. Her  smoking use included cigarettes. She has a 5.00 pack-year smoking history. She quit smokeless tobacco use about 46 years ago. She reports current alcohol use. She reports that she does not use drugs.  Allergies  Allergen Reactions   Aricept [Donepezil Hcl]     GI upset     Family History  Problem Relation Age of Onset   Hypertension Mother    Cancer Mother        ? type lived 89    Alcohol abuse Father        died 25    Cirrhosis Father    Hypertension Daughter        Almyra Free   Cirrhosis Brother        a lot of health problems died 81s-70s    Hyperlipidemia Son     Prior to Admission medications   Medication Sig Start Date End Date Taking? Authorizing Provider  amlodipine-benazepril (LOTREL) 2.5-10 MG capsule TAKE 1 CAPSULE BY MOUTH ONCE DAILY 05/10/22   Dana Allan, MD  aspirin 81 MG tablet Take 1 tablet (81 mg total) by mouth daily. Hold aspirin x 1 week as per neuro surg 06/23/22   Charise Killian, MD  atorvastatin (LIPITOR) 10 MG tablet TAKE ONE TABLET BY MOUTH EVERY DAY AT [facility] 01/05/22   Worthy Rancher B, FNP  Cholecalciferol (VITAMIN D3) 50 MCG (2000 UT) TABS Take 4,000 Units by mouth daily.    [provider]  levothyroxine (SYNTHROID) 125 MCG tablet Take 1 tablet (125 mcg total) by mouth daily before breakfast. 02/17/22   Dana Allan, MD  mirtazapine (REMERON) 7.5 MG tablet Take 7.5 mg by mouth at bedtime. Take 1 tablet daily 12/27/21   [provider]  rivastigmine (EXELON) 9.5 mg/24hr  04/24/18   [provider]    Physical Exam: Temperature 98.6 F, blood pressure 180/102, pulse rate 63, respirations 18, SpO2 100% on room air Elderly adult female, somnolent, poorly responsive, opens eyes sluggishly to voice, then closes them again Conjunctival, lids and lashes normal, no icterus, no nasal deformity or discharge, oropharynx tacky dry, she does not open mouth wide enough to observe more, no neck masses Heart rate slow, regular, I do not  appreciate murmurs, there is no pitting edema of the lower extremities, no JVD Respiratory rate seems normal, lung sounds are diminished overall but I do not appreciate rales or wheezes Abdomen soft, no distention, no grimace to palpation She is very sluggish, speech is slurred due to fatigue, she names her children, although she makes no consistent purposeful responses, is unable to say her name or where she is, falls back to sleep, makes no spontaneous verbalizations, upper extremity strength 4/5 bilaterally, lower extremities not tested    Data Reviewed: Basic metabolic panel shows mild chronic hyponatremia, normal renal function LFTs with mild transaminitis CBC unremarkable Lipase slightly elevated Chest x-ray, personally reviewed, shows hyperinflation, resolving bilateral lower lobe pneumonia CT of  the abdomen and pelvis shows bladder distention EKG, personally reviewed shows atrial fibrillation, resolved to normal sinus rhythm    Assessment and Plan: * Seizures (HCC) Presumably triggered by SDH. - Consult Neuro - Continue Keppra - LTM EEG tonight - Obtain MRI brain after EEG done    Paroxysmal atrial fibrillation with RVR After placement of foley, patient developed Afib and hypotension.  She was started on amiodarone briefly, and given fluids and her BP improved and she converted back to sinus.  Given her SDH, I would need a high threshold to anticoagulate.   - No DOAC for now - Monitor on tele - Follow up with Cardiology after discharge pending goals for more extended cardiac monitoring   Subdural hematoma (HCC) Left frontal convexity, 6mm.  CT this admission shows expected interval evolution, no worsening.  Acute metabolic encephalopathy Here, she is somnolent, and not oriented to person, place or time, and poorly responsive.  Presumably this is post-ictal.  Recent TSH, cortisol and B12 all normal.  No focal signs of infection. - Check ammonia - LTM EEG - Consult  Neuro - Obtain MRI brain after EEG  Acute urinary retention Noted to have belly distension on arrival to ER (this was actually the chief complaint).  Foley placed and >1L urine output.   - Trial foley removal tomorrow if patient awake  Elevated LFTs This is new, very mild.  CT imaging of liver unremarkable. - Trend LFTs for now  Dementia (HCC) At baseline is ambulatory, conversational, with just some mild short term memory deficits.  Obstructive sleep apnea - If patient becomes more alert, can resume home CPAP  Essential hypertension BP low in setting of Afib, now trending back up - Resume amlodipine and benazepril when able to reliably take PO - Consider change to BB given arrhythmia   Hypothyroidism, acquired TSH normal recently - Continue levothyroxine  Mixed hyperlipidemia - Continue Crestor  Hyponatremia Na 130.  She had acute on chronic hyponatremia last [facility] at [facility], treated with hypertonic saline.  This level is on par with her baseline off and on over the last 15 years. - Trend Na         Advance Care Planning: DNR, confirmed with family  Consults: Neurology  Family Communication: Husband, daughter, 2 sons at the bedside  Severity of Illness: The appropriate patient status for this patient is INPATIENT. Inpatient status is judged to be reasonable and necessary in order to provide the required intensity of service to ensure the patient's safety. The patient's presenting symptoms, physical exam findings, and initial radiographic and laboratory data in the context of their chronic comorbidities is felt to place them at high risk for further clinical deterioration. Furthermore, it is not anticipated that the patient will be medically stable for discharge from the hospital within 2 midnights of admission.   * I certify that at the point of admission it is my clinical judgment that the patient will require inpatient hospital care spanning beyond 2 midnights from  the point of admission due to high intensity of service, high risk for further deterioration and high frequency of surveillance required.*  Author: Alberteen Sam, MD 07/03/2022 4:58 PM  For on call review [email or web address].

## 2022-07-04 DIAGNOSIS — E871 Hypo-osmolality and hyponatremia: Secondary | ICD-10-CM

## 2022-07-04 DIAGNOSIS — I4891 Unspecified atrial fibrillation: Secondary | ICD-10-CM

## 2022-07-04 DIAGNOSIS — G9341 Metabolic encephalopathy: Secondary | ICD-10-CM

## 2022-07-04 DIAGNOSIS — R569 Unspecified convulsions: Secondary | ICD-10-CM | POA: Diagnosis not present

## 2022-07-04 LAB — CBC
HCT: 36.9 % (ref 36.0–46.0)
Hemoglobin: 12.3 g/dL (ref 12.0–15.0)
MCH: 31.9 pg (ref 26.0–34.0)
MCHC: 33.3 g/dL (ref 30.0–36.0)
MCV: 95.6 fL (ref 80.0–100.0)
Platelets: 293 10*3/uL (ref 150–400)
RBC: 3.86 MIL/uL — ABNORMAL LOW (ref 3.87–5.11)
RDW: 13.2 % (ref 11.5–15.5)
WBC: 12.1 10*3/uL — ABNORMAL HIGH (ref 4.0–10.5)
nRBC: 0 % (ref 0.0–0.2)

## 2022-07-04 LAB — MAGNESIUM: Magnesium: 1.7 mg/dL (ref 1.7–2.4)

## 2022-07-04 LAB — COMPREHENSIVE METABOLIC PANEL
ALT: 32 U/L (ref 0–44)
AST: 27 U/L (ref 15–41)
Albumin: 2.5 g/dL — ABNORMAL LOW (ref 3.5–5.0)
Alkaline Phosphatase: 125 U/L (ref 38–126)
Anion gap: 10 (ref 5–15)
BUN: 5 mg/dL — ABNORMAL LOW (ref 8–23)
CO2: 24 mmol/L (ref 22–32)
Calcium: 8.6 mg/dL — ABNORMAL LOW (ref 8.9–10.3)
Chloride: 93 mmol/L — ABNORMAL LOW (ref 98–111)
Creatinine, Ser: 0.54 mg/dL (ref 0.44–1.00)
GFR, Estimated: >60 mL/min (ref >60)
Glucose, Bld: 136 mg/dL — ABNORMAL HIGH (ref 70–99)
Potassium: 3.4 mmol/L — ABNORMAL LOW (ref 3.5–5.1)
Sodium: 127 mmol/L — ABNORMAL LOW (ref 135–145)
Total Bilirubin: 1.1 mg/dL (ref 0.3–1.2)
Total Protein: 5.3 g/dL — ABNORMAL LOW (ref 6.5–8.1)

## 2022-07-04 LAB — AMMONIA: Ammonia: 37 umol/L — ABNORMAL HIGH (ref 9–35)

## 2022-07-04 LAB — GLUCOSE, CAPILLARY
Glucose-Capillary: 106 mg/dL — ABNORMAL HIGH (ref 70–99)
Glucose-Capillary: 95 mg/dL (ref 70–99)

## 2022-07-04 MED ORDER — POTASSIUM CHLORIDE 10 MEQ/100ML IV SOLN
10.0000 meq | INTRAVENOUS | Status: AC
  Administered 2022-07-04 (×4): 10 meq via INTRAVENOUS
  Filled 2022-07-04 (×4): qty 100

## 2022-07-04 MED ORDER — AMIODARONE HCL IN DEXTROSE 360-4.14 MG/200ML-% IV SOLN
60.0000 mg/h | INTRAVENOUS | Status: AC
  Administered 2022-07-04 (×2): 60 mg/h via INTRAVENOUS
  Filled 2022-07-04: qty 200

## 2022-07-04 MED ORDER — SODIUM CHLORIDE 0.9 % IV SOLN: INTRAVENOUS | Status: DC

## 2022-07-04 MED ORDER — AMIODARONE LOAD VIA INFUSION
150.0000 mg | Freq: Once | INTRAVENOUS | Status: AC
  Administered 2022-07-04: 150 mg via INTRAVENOUS
  Filled 2022-07-04: qty 83.34

## 2022-07-04 MED ORDER — CHLORHEXIDINE GLUCONATE CLOTH 2 % EX PADS
6.0000 | MEDICATED_PAD | Freq: Every day | CUTANEOUS | Status: DC
  Administered 2022-07-04 – 2022-07-14 (×11): 6 via TOPICAL

## 2022-07-04 MED ORDER — TAMSULOSIN HCL 0.4 MG PO CAPS
0.4000 mg | ORAL_CAPSULE | Freq: Every day | ORAL | Status: DC
  Administered 2022-07-05 – 2022-07-12 (×8): 0.4 mg via ORAL
  Filled 2022-07-04 (×8): qty 1

## 2022-07-04 MED ORDER — AMIODARONE HCL IN DEXTROSE 360-4.14 MG/200ML-% IV SOLN
30.0000 mg/h | INTRAVENOUS | Status: AC
  Administered 2022-07-04 – 2022-07-05 (×2): 30 mg/h via INTRAVENOUS
  Filled 2022-07-04 (×4): qty 200

## 2022-07-04 MED ORDER — MAGNESIUM SULFATE 2 GM/50ML IV SOLN
2.0000 g | Freq: Once | INTRAVENOUS | Status: AC
  Administered 2022-07-04: 2 g via INTRAVENOUS
  Filled 2022-07-04: qty 50

## 2022-07-04 NOTE — Progress Notes (Addendum)
Subjective: NAEO.  Able to tell me her name and name objects correctly but keeps asking for help and thinks she is at home.  ROS: negative except above  Examination  Vital signs in last 24 hours: Temp:  [97.3 F (36.3 C)-99.1 F (37.3 C)] 98.3 F (36.8 C) (06/10 0853) Pulse Rate:  [53-98] 98 (06/10 0853) Resp:  [16-27] 20 (06/10 0853) BP: (104-180)/(74-123) 120/74 (06/10 0853) SpO2:  [83 %-100 %] 100 % (06/10 0853)  General: lying in bed, NAD Neuro: Alert, able to tell me her name, thinks she is at home, not oriented to time, able to name objects but did not follow commands, cranial nerves appear grossly intact, spontaneously moving all 4 extremities in bed  Basic Metabolic Panel: Recent Labs  Lab 07/02/22 1041 07/03/22 1648 07/04/22 0752  NA 130* 132* 127*  K 4.4 3.7 3.4*  CL 93* 94* 93*  CO2 26 25 24   GLUCOSE 115* 104* 136*  BUN 12 9 5*  CREATININE 0.61 0.72 0.54  CALCIUM 9.8 9.4 8.6*    CBC: Recent Labs  Lab 07/02/22 1041 07/03/22 1648 07/04/22 0752  WBC 9.3 14.4* 12.1*  HGB 15.3* 13.9 12.3  HCT 45.3 41.2 36.9  MCV 94.4 96.7 95.6  PLT 364 363 293   Coagulation Studies: No results for input(s): "LABPROT", "INR" in the last 72 hours.  Imaging CTH wo contrast 07/02/2022:  No new or acute finding. Primarily isodense subdural hematoma on the left cerebral convexity shows expected interval evolution.      ASSESSMENT AND PLAN: 87 year old female with history of dementia, chronic hyponatremia presented with right-sided twitching consistent with focal motor seizure.  CT head showed left subdural hematoma.  Seizures Subdural hematoma Dementia Hyperammonemia Chronic hyponatremia Hypokalemia Hypoproteinemia with hypoalbuminemia Leukocytosis (improving) -No seizures on EEG but continues to have Wernicke's aphasia aphasia, could be postictal.  Ammonia is mildly elevated, unlikely the cause for aphasia or seizures.  Leukocytosis could be secondary to seizures, has  improved  Recommendations -Continue Keppra 750 mg twice daily -Continue video EEG overnight.  Will DC tomorrow if no seizures -Management of rest of medical comorbidities per primary team -Continue seizure precautions -As needed IV Versed for clinical seizures -Plan discussed with Dr. Rito Ehrlich via secure chat  I have spent a total of 36 minutes with the patient reviewing hospital notes,  test results, labs and examining the patient as well as establishing an assessment and plan.  > 50% of time was spent in direct patient care.    Lindie Spruce Epilepsy [facility] For questions after 5pm please refer to AMION to reach the Neurologist on call

## 2022-07-04 NOTE — Evaluation (Signed)
Clinical/Bedside Swallow Evaluation Patient Details  Name: Laura Carpenter MRN: [id number] Date of Birth: August 20, 1935  Today's Date: 07/04/2022 Time: SLP Start Time (ACUTE ONLY): 1557 SLP Stop Time (ACUTE ONLY): 1610 SLP Time Calculation (min) (ACUTE ONLY): 13 min  Past Medical History:  Past Medical History:  Diagnosis Date   Actinic keratosis 05/08/2019   Breast cancer (HCC)    02/17/10: right lumpectomy reveals 0.8 cm of invasive ductal carcinoma with 1/3 LN+ for isolated tumor cell cluster in Oncotype recurrent score 19 or 12% chance of distant recurrence; Femara self-discontinued secondary to side effects. She continues to decline further therapy   Chronic hyponatremia    Hyponatremia for which she has had since somewhere in the mid 1990's. She's had multiple hospitalization, one hospitalization in 1993, she was diagnosed with an EF of 25% as well, though coronaries were clean during that hospitalization. The cath showed an EF of 50%. She is being worked up for her hyponatremia at [facility].   Chronic hyponatremia    Hyponatremia for which she has had since somewhere in the mid 1990's. She's had multiple hospitalization, one hospitalization in 1993, she was diagnosed with an EF of 25% as well, though coronaries were clean during that hospitalization. The cath showed an EF of 50%. She is being worked up for her hyponatremia at [facility].   Dehydration 06/29/2015   Dementia (HCC)    Dizziness    Dizziness 12/26/2011   Excessive cerumen in both ear canals 06/09/2020   Fall at home 09/24/2015   Fibromuscular dysplasia of renal artery [facility])    1997 Renal angioplasty at [facility]. She subsequently underwent repeat angioplasty a few years later. 12/2009 Abdominal Duplex: <60% bilateral RAS, Sequential narrowing and dilations of renal arteries, suggests fibromuscular dysplasia.    H/O electrolyte imbalance    Hearing loss    Hemorrhoid    History of radiation therapy 2011   Hypertension    Hyponatremia  06/28/2018   Hypothyroidism    Ingrowing nail, left great toe    Ingrowing nail, left great toe 03/25/2014   Non-melanoma skin cancer    right hand   OSA on CPAP    Otalgia of right ear 11/10/2011   Otalgia of right ear 11/10/2011   Polycythemia    Right ankle pain 10/28/2020   Senile dementia of Alzheimer's type (HCC) 06/29/2018   Established [facility]   Skin cancer    squamous cell skin cancer removed by Dermatology--arms and hands   Skin lesion of left leg 05/29/2012   Sleep apnea    Uterine fibroid 06/28/2018   UTI (urinary tract infection)    Vertigo 12/26/2011   Past Surgical History:  Past Surgical History:  Procedure Laterality Date   ANGIOPLASTY OF RIGHT RENAL ARTERY  05/04/1995   APPENDECTOMY     BREAST LUMPECTOMY Right 2012   performed [facility]    core biopsy breast Right 2012   ultrasound guided core needle biopsy of the right breast reveals invasive ductal carcinoma, ER/PR positive, HER2 neu    FRACTURE SURGERY  2010   right   HPI:  Pt is an 87 y.o. female who presented secondary to AMS and abdominal distension. CT head negative for acute changes. EEG 6/10: cortical dysfunction arising from left temporal region likely secondary to underlying subdural hematoma. No seizures or epileptiform discharges. CXR 6/8: COPD. There is prominence of central pulmonary vessels and interstitial markings in mid and lower lung fields suggesting possible CHF. There is no focal pulmonary  consolidation. PMH: dementia, HTN, HLD, hypothyroidism. Pt recently admitted to [facility] for fall/SDH and hyponatremia ~3 weeks prior to this admission.    Assessment / Plan / Recommendation  Clinical Impression  Pt was seen for bedside swallow evaluation with her husband and daughter present. Both parties denied the pt having a history of oropharyngeal dysphagia, but pt's daughter added that the pt had pneumonia recently and had difficulty expectorating phlegm. Oral mechanism exam was limited due  to pt's difficulty following commands; however, oral motor strength and ROM appeared grossly WFL and her natural dentition was adequate. Pt was adequately alert for trials, but alertness was suboptimal. She presented with symptoms of oropharyngeal dysphagia characterized by reduced bolus awareness, oral holding, absent mastication, a suspected pharyngeal delay, and inconsistent throat clearing with thin liquids. A dysphagia 1 diet with nectar thick liquids is recommended at this time, but prognosis for advancement is judged to be good with improvement in alertness and mentation. SLP will follow to ensure tolerance and for advancement as clinically indicated. SLP Visit Diagnosis: Dysphagia, unspecified (R13.10)    Aspiration Risk  Mild aspiration risk    Diet Recommendation Dysphagia 1 (Puree);Nectar-thick liquid   Liquid Administration via: Cup;Straw Medication Administration: Crushed with puree Supervision: Full supervision/cueing for compensatory strategies Compensations: Slow rate;Small sips/bites;Minimize environmental distractions Postural Changes: Seated upright at 90 degrees    Other  Recommendations Oral Care Recommendations: Oral care BID    Recommendations for follow up therapy are one component of a multi-disciplinary discharge planning process, led by the attending physician.  Recommendations may be updated based on patient status, additional functional criteria and insurance authorization.  Follow up Recommendations  (TBD)      Assistance Recommended at Discharge    Functional Status Assessment Patient has had a recent decline in their functional status and demonstrates the ability to make significant improvements in function in a reasonable and predictable amount of time.  Frequency and Duration min 2x/week  2 weeks       Prognosis Prognosis for improved oropharyngeal function: Good Barriers to Reach Goals: Cognitive deficits      Swallow Study   General Date of  Onset: 07/03/22 HPI: Pt is an 87 y.o. female who presented secondary to AMS and abdominal distension. CT head negative for acute changes. EEG 6/10: cortical dysfunction arising from left temporal region likely secondary to underlying subdural hematoma. No seizures or epileptiform discharges. CXR 6/8: COPD. There is prominence of central pulmonary vessels and interstitial markings in mid and lower lung fields suggesting possible CHF. There is no focal pulmonary consolidation. PMH: dementia, HTN, HLD, hypothyroidism. Pt recently admitted to [facility] for fall/SDH and hyponatremia ~3 weeks prior to this admission. Type of Study: Bedside Swallow Evaluation Previous Swallow Assessment: none Diet Prior to this Study: Regular;Thin liquids (Level 0) Temperature Spikes Noted: No Respiratory Status: Nasal cannula History of Recent Intubation: No Behavior/Cognition: Cooperative;Pleasant mood;Lethargic/Drowsy;Requires cueing;Doesn't follow directions Oral Cavity Assessment: Within Functional Limits Oral Care Completed by SLP: No Oral Cavity - Dentition: Adequate natural dentition Self-Feeding Abilities: Total assist Patient Positioning: Upright in bed;Postural control adequate for testing Baseline Vocal Quality:  (limited output) Volitional Cough: Cognitively unable to elicit Volitional Swallow: Able to elicit    Oral/Motor/Sensory Function Overall Oral Motor/Sensory Function: Within functional limits   Ice Chips Ice chips: Impaired Presentation: Spoon Oral Phase Impairments: Poor awareness of bolus Oral Phase Functional Implications: Oral holding   Thin Liquid Thin Liquid: Impaired Presentation: Cup;Spoon;Straw Oral Phase Impairments: Poor awareness of bolus Oral Phase Functional  Implications: Oral holding;Prolonged oral transit Pharyngeal  Phase Impairments: Suspected delayed Swallow    Nectar Thick Nectar Thick Liquid: Impaired Presentation: Cup;Straw Oral Phase Impairments: Poor awareness of  bolus Oral phase functional implications: Oral holding   Honey Thick Honey Thick Liquid: Not tested   Puree Puree: Impaired Presentation: Spoon Oral Phase Impairments: Poor awareness of bolus Oral Phase Functional Implications: Oral holding   Solid     Solid: Impaired Oral Phase Impairments: Impaired mastication     Anetta Olvera I. Vear Clock, MS, CCC-SLP Neuro Diagnostic Specialist  Acute Rehabilitation Services Office number: [phone]  Scheryl Marten 07/04/2022,4:22 PM

## 2022-07-04 NOTE — Procedures (Signed)
Patient Name: PENNY ARRAMBIDE  MRN: [id number]  Epilepsy Attending: Charlsie Quest  Referring Physician/Provider: Erick Blinks, MD  Duration: 07/03/2022 1724 to 07/04/2022 1724  Patient history: 87 y.o. F with dementia who presented with decreased mentation. EEG to evaluate for seizure.  Level of alertness: Awake, asleep  AEDs during EEG study: LEV  Technical aspects: This EEG study was done with scalp electrodes positioned according to the 10-20 International system of electrode placement. Electrical activity was reviewed with band pass filter of 1-70Hz , sensitivity of 7 uV/mm, display speed of 44mm/sec with a 60Hz  notched filter applied as appropriate. EEG data were recorded continuously and digitally stored.  Video monitoring was available and reviewed as appropriate.  Description: The posterior dominant rhythm consists of 8 Hz activity of moderate voltage (25-35 uV) seen predominantly in posterior head regions, symmetric and reactive to eye opening and eye closing. Sleep was characterized by vertex waves, sleep spindles (12 to 14 Hz), maximal frontocentral region. EEG showed intermittent 3 to 6 Hz theta-delta slowing in left temporal region. Hyperventilation and photic stimulation were not performed.     ABNORMALITY - Intermittent slow, left temporal region  IMPRESSION: This study is suggestive of cortical dysfunction arising from left temporal region likely secondary to underlying subdural hematoma. No seizures or epileptiform discharges were seen throughout the recording.  Mahaley Schwering [name]

## 2022-07-04 NOTE — Progress Notes (Signed)
PT Cancellation Note  Patient Details Name: Laura Carpenter MRN: [id number] DOB: 1935/02/21   Cancelled Treatment:    Reason Eval/Treat Not Completed: Patient at procedure or test/unavailable (EEG)  Lillia Pauls, PT, DPT Acute Rehabilitation Services Office [phone]    Norval Morton 07/04/2022, 1:43 PM

## 2022-07-04 NOTE — Progress Notes (Signed)
   07/04/22 0547  Vitals  BP (!) 140/108  MAP (mmHg) 118  ECG Heart Rate (!) 124  Resp 16   EKG A-fib RVR. BP taken as shown above. Provider paged.

## 2022-07-04 NOTE — Progress Notes (Signed)
   07/04/22 0626  Vitals  BP 104/83  MAP (mmHg) 90  ECG Heart Rate (!) 102  Resp 16   Pt calm, resting in bed, respirations equal. EKG on monitor still A-fib. Provider updated

## 2022-07-04 NOTE — Progress Notes (Signed)
[facility] PROGRESS NOTE   Laura Carpenter VHQ:[id number] DOB: 30-Aug-1935 DOA: 07/03/2022  PCP: Dana Allan, MD  Brief History/Interval Summary: 87 y.o. F with dementia, HTN, HLD, hypothyroidism and recently admitted at [facility] for fall/SDH and hyponatremia requiring 3% saline about 3 weeks ago, discharged to SNF who returned for decreased mentation and belly distension. Since discharge to SNF after her SDH, patient was improving per family. In the ER, CTH there showed expected evolution of SDH.  She was observed to have repeated twitching behaviors, Neurology were consulted, and suspected partial seizures.  She was loaded with Keppra. CT abdomen showed dilstended bladder, and after placement of a foley resulting in >1L urine output, the patient developed hypotension and atrial fibrillation, which stabilized with amiodarone and IV fluids.   Consultants: Neurology  Procedures: Continuous EEG    Subjective/Interval History: Patient noted to be confused disoriented.  Not answering questions appropriately.    Assessment/Plan:  Seizures, likely triggered by subdural hematoma Neurology is consulted.  Patient currently on continuous EEG.  Patient started on Keppra.  Acute metabolic encephalopathy Likely triggered by seizures.  No obvious focal neurological deficits noted.  She does have a history of dementia at baseline.  Recent TSH cortisol B12 levels were normal.  No focal signs of infection. She will need to undergo MRI after she comes off of long-term monitoring. Ammonia level is 37.  Unlikely to be the reason for her encephalopathy.  Atrial fibrillation, suspected to be new onset. No previous history of same. She was noted to be in atrial fibrillation in the emergency department.  Looks like she was given amiodarone with which she converted back to sinus rhythm.  Amiodarone was subsequently discontinued. She then converted back into atrial fibrillation this morning. Will initiate  amiodarone. Due to recent subdural hematoma she is not a candidate for anticoagulation.  TSH was normal back in May. Echocardiogram done in May showed normal systolic function.  No significant valvular abnormalities noted.  Subdural hematoma CT suggest that this is stable.  Acute urinary retention Was noted to have distended bladder on CT scan.  Foley catheter was placed and more than 1 L of urine was noted.  Continue Foley catheter for now.  Will initiate Flomax.  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  She is noted to be on D5 infusion which will be stopped.  She will be changed over to normal saline infusion. Replace potassium.  Check magnesium level.  Mildly abnormal LFTs Resolved.  History of dementia At baseline she is ambulatory, conversational with just some mild short-term deficits.  Essential hypertension Monitor blood pressure closely.  Hypothyroidism Continue with levothyroxine.  Obstructive sleep apnea Unclear if she uses CPAP consistently or not.  Continue to monitor.   DVT Prophylaxis: SCDs Code Status: DNR Family Communication: No family at bedside Disposition Plan: Return to SNF when improved  Status is: Inpatient Remains inpatient appropriate because: Seizures, atrial fibrillation, hyponatremia      Medications: Scheduled:  atorvastatin  10 mg Oral q1800   levothyroxine  125 mcg Oral QAC breakfast   mirtazapine  7.5 mg Oral QHS   rivastigmine  9.5 mg Transdermal Daily   Continuous:  dextrose 5 % and 0.9 % NaCl with KCl 20 mEq/L 100 mL/hr at 07/04/22 0604   levETIRAcetam 750 mg (07/04/22 0853)   UXL:[id number] **OR** acetaminophen, labetalol, ondansetron **OR** ondansetron (ZOFRAN) IV  Antibiotics: Anti-infectives (From admission, onward)    None       Objective:  Vital Signs  Vitals:   07/04/22 0429 07/04/22 0547 07/04/22 0626 07/04/22 0853  BP: (!) 157/97 (!) 140/108 104/83 120/74  Pulse:  94   98  Resp: 17 16 16 20   Temp: (!) 97.3 F (36.3 C)   98.3 F (36.8 C)  TempSrc: Oral   Oral  SpO2: 100%   100%    Intake/Output Summary (Last 24 hours) at 07/04/2022 1032 Last data filed at 07/04/2022 0943 Gross per 24 hour  Intake 2443.37 ml  Output 1650 ml  Net 793.37 ml   There were no vitals filed for this visit.  General appearance: Awake alert.  In no distress.  Distracted Resp: Clear to auscultation bilaterally.  Normal effort Cardio: S1-S2 is normal regular.  No S3-S4.  No rubs murmurs or bruit GI: Abdomen is soft.  Nontender nondistended.  Bowel sounds are present normal.  No masses organomegaly Extremities: No edema.    Lab Results:  Data Reviewed: I have personally reviewed following labs and reports of the imaging studies  CBC: Recent Labs  Lab 07/02/22 1041 07/03/22 1648 07/04/22 0752  WBC 9.3 14.4* 12.1*  HGB 15.3* 13.9 12.3  HCT 45.3 41.2 36.9  MCV 94.4 96.7 95.6  PLT 364 363 293    Basic Metabolic Panel: Recent Labs  Lab 07/02/22 1041 07/03/22 1648 07/04/22 0752  NA 130* 132* 127*  K 4.4 3.7 3.4*  CL 93* 94* 93*  CO2 26 25 24   GLUCOSE 115* 104* 136*  BUN 12 9 5*  CREATININE 0.61 0.72 0.54  CALCIUM 9.8 9.4 8.6*    GFR: Estimated Creatinine Clearance: 43.8 mL/min (by C-G formula based on SCr of 0.54 mg/dL).  Liver Function Tests: Recent Labs  Lab 07/02/22 1041 07/03/22 1648 07/04/22 0752  AST 45* 37 27  ALT 53* 40 32  ALKPHOS 179* 162* 125  BILITOT 1.2 1.0 1.1  PROT 7.6 6.5 5.3*  ALBUMIN 4.0 3.4* 2.5*    Recent Labs  Lab 07/02/22 1041  LIPASE 54*   Recent Labs  Lab 07/04/22 0752  AMMONIA 37*     Radiology Studies: Overnight EEG with video  Result Date: 07/04/2022 Charlsie Quest, MD     07/04/2022  9:35 AM Patient Name: Laura Carpenter MRN: [id number] Epilepsy Attending: Charlsie Quest Referring Physician/Provider: Erick Blinks, MD Duration: 07/03/2022 1724 to 07/04/2022 0930 Patient history: 87 y.o. F with  dementia who presented with decreased mentation. EEG to evaluate for seizure. Level of alertness: Awake, asleep AEDs during EEG study: LEV Technical aspects: This EEG study was done with scalp electrodes positioned according to the 10-20 International system of electrode placement. Electrical activity was reviewed with band pass filter of 1-70Hz , sensitivity of 7 uV/mm, display speed of 67mm/sec with a 60Hz  notched filter applied as appropriate. EEG data were recorded continuously and digitally stored.  Video monitoring was available and reviewed as appropriate. Description: The posterior dominant rhythm consists of 8 Hz activity of moderate voltage (25-35 uV) seen predominantly in posterior head regions, symmetric and reactive to eye opening and eye closing. Sleep was characterized by vertex waves, sleep spindles (12 to 14 Hz), maximal frontocentral region. EEG showed intermittent 3 to 6 Hz theta-delta slowing in left temporal region. Hyperventilation and photic stimulation were not performed.   ABNORMALITY - Intermittent slow, left temporal region IMPRESSION: This study is suggestive of cortical dysfunction arising from left temporal region likely secondary to underlying subdural hematoma. No seizures or epileptiform discharges were seen throughout the recording. Priyanka [name]  DG Chest Portable 1 View  Result Date: 07/02/2022 CLINICAL DATA:  Shortness of breath EXAM: PORTABLE CHEST 1 VIEW COMPARISON:  06/18/2022 FINDINGS: Transverse diameter of heart is increased. Low position of diaphragms suggests COPD. Central pulmonary vessels are prominent. There is prominence of interstitial markings in the parahilar regions and lower lung fields. There is no focal consolidation. There is no significant pleural effusion or pneumothorax. IMPRESSION: COPD. There is prominence of central pulmonary vessels and interstitial markings in mid and lower lung fields suggesting possible CHF. There is no focal pulmonary  consolidation. Electronically Signed   By: Ernie Avena M.D.   On: 07/02/2022 15:27   CT ABDOMEN PELVIS W CONTRAST  Result Date: 07/02/2022 CLINICAL DATA:  Abdominal pain, acute, nonlocalized. EXAM: CT ABDOMEN AND PELVIS WITH CONTRAST TECHNIQUE: Multidetector CT imaging of the abdomen and pelvis was performed using the standard protocol following bolus administration of intravenous contrast. RADIATION DOSE REDUCTION: This exam was performed according to the departmental dose-optimization program which includes automated exposure control, adjustment of the mA and/or kV according to patient size and/or use of iterative reconstruction technique. CONTRAST:  80mL OMNIPAQUE IOHEXOL 300 MG/ML  SOLN COMPARISON:  CT abdomen/pelvis 04/13/2022. FINDINGS: Lower chest: Patchy consolidation in the left lower lobe, suspicious for aspiration or infection. Hepatobiliary: No focal liver abnormality is seen. No gallstones, gallbladder wall thickening, or biliary dilatation. Pancreas: Unremarkable. No pancreatic ductal dilatation or surrounding inflammatory changes. Spleen: Normal. Adrenals/Urinary Tract: Adrenal glands are unremarkable. Mild bilateral hydroureteronephrosis. Severe distention of the bladder, suspicious for bladder outlet obstruction versus neurogenic bladder. Stomach/Bowel: Stomach is within normal limits. Appendix appears normal. No evidence of bowel wall thickening, distention, or inflammatory changes. Vascular/Lymphatic: Aortic atherosclerosis. No enlarged abdominal or pelvic lymph nodes. Reproductive: Uterus and bilateral adnexa are unremarkable. Other: No abdominal wall hernia or abnormality. No abdominopelvic ascites. Musculoskeletal: Mild lower lumbar spondylosis without high-grade spinal canal stenosis. No acute bone findings. Degenerative changes of the bilateral hips. IMPRESSION: 1. Severe distention of the bladder, suspicious for bladder outlet obstruction versus neurogenic bladder. Mild bilateral  hydroureteronephrosis. 2. Patchy consolidation in the left lung lower lobe, suspicious for aspiration or pneumonia. Aortic Atherosclerosis (ICD10-I70.0). Electronically Signed   By: Orvan Falconer M.D.   On: 07/02/2022 12:49   CT Head Wo Contrast  Result Date: 07/02/2022 CLINICAL DATA:  Altered mental status EXAM: CT HEAD WITHOUT CONTRAST TECHNIQUE: Contiguous axial images were obtained from the base of the skull through the vertex without intravenous contrast. RADIATION DOSE REDUCTION: This exam was performed according to the departmental dose-optimization program which includes automated exposure control, adjustment of the mA and/or kV according to patient size and/or use of iterative reconstruction technique. COMPARISON:  Two days ago FINDINGS: Brain: Isodense subdural hematoma along the left cerebral convexity without convincing change, to 5 mm in thickness. High-density components have mildly decreased. No new hemorrhage. No infarct, hydrocephalus, or collection. No worrisome mass effect. Vascular: No hyperdense vessel or unexpected calcification. Skull: Normal. Negative for fracture or focal lesion. Sinuses/Orbits: No acute finding. IMPRESSION: 1. No new or acute finding. 2. Primarily isodense subdural hematoma on the left cerebral convexity shows expected interval evolution. Electronically Signed   By: Tiburcio Pea M.D.   On: 07/02/2022 12:26       LOS: 1 day   Osvaldo Shipper  [facility] Pager on [email or web address]  07/04/2022, 10:32 AM

## 2022-07-04 NOTE — Progress Notes (Signed)
LTM maint complete - no skin breakdown under: FP2,F8  

## 2022-07-05 DIAGNOSIS — I480 Paroxysmal atrial fibrillation: Secondary | ICD-10-CM

## 2022-07-05 DIAGNOSIS — R569 Unspecified convulsions: Secondary | ICD-10-CM | POA: Diagnosis not present

## 2022-07-05 DIAGNOSIS — E871 Hypo-osmolality and hyponatremia: Secondary | ICD-10-CM | POA: Diagnosis not present

## 2022-07-05 LAB — CBC
HCT: 36.4 % (ref 36.0–46.0)
Hemoglobin: 12.5 g/dL (ref 12.0–15.0)
MCH: 32.7 pg (ref 26.0–34.0)
MCHC: 34.3 g/dL (ref 30.0–36.0)
MCV: 95.3 fL (ref 80.0–100.0)
Platelets: 321 10*3/uL (ref 150–400)
RBC: 3.82 MIL/uL — ABNORMAL LOW (ref 3.87–5.11)
RDW: 13.2 % (ref 11.5–15.5)
WBC: 7.1 10*3/uL (ref 4.0–10.5)
nRBC: 0 % (ref 0.0–0.2)

## 2022-07-05 LAB — BASIC METABOLIC PANEL
Anion gap: 10 (ref 5–15)
BUN: 7 mg/dL — ABNORMAL LOW (ref 8–23)
CO2: 25 mmol/L (ref 22–32)
Calcium: 8.9 mg/dL (ref 8.9–10.3)
Chloride: 97 mmol/L — ABNORMAL LOW (ref 98–111)
Creatinine, Ser: 0.62 mg/dL (ref 0.44–1.00)
GFR, Estimated: >60 mL/min (ref >60)
Glucose, Bld: 92 mg/dL (ref 70–99)
Potassium: 3.9 mmol/L (ref 3.5–5.1)
Sodium: 132 mmol/L — ABNORMAL LOW (ref 135–145)

## 2022-07-05 LAB — GLUCOSE, CAPILLARY: Glucose-Capillary: 79 mg/dL (ref 70–99)

## 2022-07-05 MED ORDER — AMIODARONE HCL 200 MG PO TABS
200.0000 mg | ORAL_TABLET | Freq: Every day | ORAL | Status: DC
  Administered 2022-07-13 – 2022-07-14 (×2): 200 mg via ORAL
  Filled 2022-07-05 (×2): qty 1

## 2022-07-05 MED ORDER — SODIUM CHLORIDE 0.9 % IV SOLN: INTRAVENOUS | Status: AC

## 2022-07-05 MED ORDER — LEVETIRACETAM IN NACL 500 MG/100ML IV SOLN
500.0000 mg | Freq: Two times a day (BID) | INTRAVENOUS | Status: DC
  Administered 2022-07-05 – 2022-07-08 (×6): 500 mg via INTRAVENOUS
  Filled 2022-07-05 (×6): qty 100

## 2022-07-05 MED ORDER — AMIODARONE HCL 200 MG PO TABS
200.0000 mg | ORAL_TABLET | Freq: Two times a day (BID) | ORAL | Status: AC
  Administered 2022-07-05 – 2022-07-12 (×14): 200 mg via ORAL
  Filled 2022-07-05 (×14): qty 1

## 2022-07-05 NOTE — Procedures (Addendum)
Patient Name: Laura Carpenter  MRN: [id number]  Epilepsy Attending: Charlsie Quest  Referring Physician/Provider: Erick Blinks, MD  Duration: 07/04/2022 1724 to 07/05/2022 0941   Patient history: 87 y.o. F with dementia who presented with decreased mentation. EEG to evaluate for seizure.   Level of alertness: Awake, asleep   AEDs during EEG study: LEV   Technical aspects: This EEG study was done with scalp electrodes positioned according to the 10-20 International system of electrode placement. Electrical activity was reviewed with band pass filter of 1-70Hz , sensitivity of 7 uV/mm, display speed of 63mm/sec with a 60Hz  notched filter applied as appropriate. EEG data were recorded continuously and digitally stored.  Video monitoring was available and reviewed as appropriate.   Description: The posterior dominant rhythm consists of 8 Hz activity of moderate voltage (25-35 uV) seen predominantly in posterior head regions, symmetric and reactive to eye opening and eye closing. Sleep was characterized by vertex waves, sleep spindles (12 to 14 Hz), maximal frontocentral region. EEG showed intermittent 3 to 6 Hz theta-delta slowing in left temporal region. Hyperventilation and photic stimulation were not performed.      ABNORMALITY - Intermittent slow, left temporal region   IMPRESSION: This study is suggestive of cortical dysfunction arising from left temporal region likely secondary to underlying subdural hematoma. No seizures or epileptiform discharges were seen throughout the recording.   [name] [name]

## 2022-07-05 NOTE — Progress Notes (Addendum)
[facility] PROGRESS NOTE   Laura Carpenter ZOX:[id number] DOB: March 05, 1935 DOA: 07/03/2022  PCP: Dana Allan, MD  Brief History/Interval Summary: 87 y.o. F with dementia, HTN, HLD, hypothyroidism and recently admitted at [facility] for fall/SDH and hyponatremia requiring 3% saline about 3 weeks ago, discharged to SNF who returned for decreased mentation and belly distension. Since discharge to SNF after her SDH, patient was improving per family. In the ER, CTH there showed expected evolution of SDH.  She was observed to have repeated twitching behaviors, Neurology were consulted, and suspected partial seizures.  She was loaded with Keppra. CT abdomen showed dilstended bladder, and after placement of a foley resulting in >1L urine output, the patient developed hypotension and atrial fibrillation, which stabilized with amiodarone and IV fluids.   Consultants: Neurology  Procedures: Continuous EEG    Subjective/Interval History: Patient still remains confused.  Answering some questions but not others.  Does not appear to be in any discomfort or distress.    Assessment/Plan:  Seizures, likely triggered by subdural hematoma Neurology is consulted.  Patient currently on continuous EEG.  Patient started on Keppra. Patient to be taken off of long-term monitoring today.  Keppra dose to be decreased.  Acute metabolic encephalopathy Likely triggered by seizures.  No obvious focal neurological deficits noted.  She does have a history of dementia at baseline.  Recent TSH cortisol B12 levels were normal.  No focal signs of infection. Ammonia level is 37.  Unlikely to be the reason for her encephalopathy. No MRI for now.  Discussed with neurology.  If she does not improve then we may consider doing at that time.  Atrial fibrillation, suspected to be new onset. No previous history of same. Has been going in and out of atrial fibrillation.  Was placed back on amiodarone infusion yesterday.  Has been  in sinus rhythm since yesterday afternoon.  Will change to oral amiodarone from this afternoon. Due to recent subdural hematoma she is not a candidate for anticoagulation.  TSH was normal back in May. Echocardiogram done in May showed normal systolic function.  No significant valvular abnormalities noted.  Subdural hematoma CT suggest that this is stable.  Acute urinary retention Was noted to have distended bladder on CT scan.  Foley catheter was placed and more than 1 L of urine was noted.  Continue Foley catheter for now.  Continue Flomax. Voiding trial once she is more mobile.  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  She is noted to be on D5 infusion which will be stopped.  Will do normal saline.  Sodium level is better today.  Will decrease the rate of IV fluids.  Recheck labs tomorrow. Potassium level is better.  Magnesium was repleted as well.    Mildly abnormal LFTs Resolved.  History of dementia At baseline she is ambulatory, conversational with just some mild short-term deficits.  Essential hypertension Monitor blood pressure closely.  Hypothyroidism Continue with levothyroxine.  Obstructive sleep apnea Unclear if she uses CPAP consistently or not.  Continue to monitor.  Oropharyngeal dysphagia On dysphagia 1 diet   DVT Prophylaxis: SCDs Code Status: DNR Family Communication: Discussed with daughter and husband yesterday. Disposition Plan: Return to SNF when improved.  PT and OT eval is pending.  Status is: Inpatient Remains inpatient appropriate because: Seizures, atrial fibrillation, hyponatremia      Medications: Scheduled:  atorvastatin  10 mg Oral q1800   Chlorhexidine Gluconate Cloth  6 each Topical Daily   levothyroxine  125 mcg Oral QAC breakfast   mirtazapine  7.5 mg Oral QHS   rivastigmine  9.5 mg Transdermal Daily   tamsulosin  0.4 mg Oral Daily   Continuous:  sodium chloride 75 mL/hr at 07/05/22 0833    amiodarone 30 mg/hr (07/05/22 0307)   levETIRAcetam     WGN:[id number] **OR** acetaminophen, labetalol, ondansetron **OR** ondansetron (ZOFRAN) IV  Antibiotics: Anti-infectives (From admission, onward)    None       Objective:  Vital Signs  Vitals:   07/05/22 0743 07/05/22 0834 07/05/22 0900 07/05/22 1000  BP: (!) 153/87 (!) 159/9 (!) 156/92 (!) 143/97  Pulse: 64 (!) 59 62 63  Resp: 18     Temp: 98 F (36.7 C)     TempSrc: Oral     SpO2: 100%       Intake/Output Summary (Last 24 hours) at 07/05/2022 1058 Last data filed at 07/05/2022 0838 Gross per 24 hour  Intake 2162.42 ml  Output 550 ml  Net 1612.42 ml     General appearance: Awake alert.  In no distress.  Distracted Resp: Clear to auscultation bilaterally.  Normal effort Cardio: S1-S2 is normal regular.  No S3-S4.  No rubs murmurs or bruit GI: Abdomen is soft.  Nontender nondistended.  Bowel sounds are present normal.  No masses organomegaly Extremities: No edema.     Lab Results:  Data Reviewed: I have personally reviewed following labs and reports of the imaging studies  CBC: Recent Labs  Lab 07/02/22 1041 07/03/22 1648 07/04/22 0752 07/05/22 0434  WBC 9.3 14.4* 12.1* 7.1  HGB 15.3* 13.9 12.3 12.5  HCT 45.3 41.2 36.9 36.4  MCV 94.4 96.7 95.6 95.3  PLT 364 363 293 321     Basic Metabolic Panel: Recent Labs  Lab 07/02/22 1041 07/03/22 1648 07/04/22 0752 07/05/22 0434  NA 130* 132* 127* 132*  K 4.4 3.7 3.4* 3.9  CL 93* 94* 93* 97*  CO2 26 25 24 25   GLUCOSE 115* 104* 136* 92  BUN 12 9 5* 7*  CREATININE 0.61 0.72 0.54 0.62  CALCIUM 9.8 9.4 8.6* 8.9  MG  --   --  1.7  --      GFR: Estimated Creatinine Clearance: 43.8 mL/min (by C-G formula based on SCr of 0.62 mg/dL).  Liver Function Tests: Recent Labs  Lab 07/02/22 1041 07/03/22 1648 07/04/22 0752  AST 45* 37 27  ALT 53* 40 32  ALKPHOS 179* 162* 125  BILITOT 1.2 1.0 1.1  PROT 7.6 6.5 5.3*  ALBUMIN 4.0 3.4* 2.5*      Recent Labs  Lab 07/02/22 1041  LIPASE 54*    Recent Labs  Lab 07/04/22 0752  AMMONIA 37*      Radiology Studies: Overnight EEG with video  Result Date: 07/04/2022 Charlsie Quest, MD     07/05/2022  9:14 AM Patient Name: Laura Carpenter MRN: [id number] Epilepsy Attending: Charlsie Quest Referring Physician/Provider: Erick Blinks, MD Duration: 07/03/2022 1724 to 07/04/2022 1724 Patient history: 87 y.o. F with dementia who presented with decreased mentation. EEG to evaluate for seizure. Level of alertness: Awake, asleep AEDs during EEG study: LEV Technical aspects: This EEG study was done with scalp electrodes positioned according to the 10-20 International system of electrode placement. Electrical activity was reviewed with band pass filter of 1-70Hz , sensitivity of 7 uV/mm, display speed of 65mm/sec with a 60Hz  notched filter applied as appropriate. EEG data were recorded continuously and digitally stored.  Video monitoring was available and reviewed  as appropriate. Description: The posterior dominant rhythm consists of 8 Hz activity of moderate voltage (25-35 uV) seen predominantly in posterior head regions, symmetric and reactive to eye opening and eye closing. Sleep was characterized by vertex waves, sleep spindles (12 to 14 Hz), maximal frontocentral region. EEG showed intermittent 3 to 6 Hz theta-delta slowing in left temporal region. Hyperventilation and photic stimulation were not performed.   ABNORMALITY - Intermittent slow, left temporal region IMPRESSION: This study is suggestive of cortical dysfunction arising from left temporal region likely secondary to underlying subdural hematoma. No seizures or epileptiform discharges were seen throughout the recording. Priyanka [name]       LOS: 2 days   Virgie Kunda [employer] Pager on [email or web address]  07/05/2022, 10:58 AM

## 2022-07-05 NOTE — Progress Notes (Signed)
LTM EEG discontinued - no skin breakdown at unhook. [facility] notified 

## 2022-07-05 NOTE — Progress Notes (Signed)
LTM maint complete - no skin breakdown under: F3, F4,C4

## 2022-07-05 NOTE — Plan of Care (Signed)
  Problem: Education: Goal: Knowledge of General Education information will improve Description: Including pain rating scale, medication(s)/side effects and non-pharmacologic comfort measures Outcome: Progressing   Problem: Health Behavior/Discharge Planning: Goal: Ability to manage health-related needs will improve Outcome: Progressing   Problem: Clinical Measurements: Goal: Ability to maintain clinical measurements within normal limits will improve Outcome: Progressing Goal: Will remain free from infection Outcome: Progressing Goal: Diagnostic test results will improve Outcome: Progressing Goal: Respiratory complications will improve Outcome: Progressing Goal: Cardiovascular complication will be avoided Outcome: Progressing   Problem: Activity: Goal: Risk for activity intolerance will decrease Outcome: Progressing Note: Patient was encouraged to move as tolerated   Problem: Nutrition: Goal: Adequate nutrition will be maintained Outcome: Progressing   Problem: Coping: Goal: Level of anxiety will decrease Outcome: Progressing   Problem: Elimination: Goal: Will not experience complications related to bowel motility Outcome: Progressing Goal: Will not experience complications related to urinary retention Outcome: Progressing   Problem: Pain Managment: Goal: General experience of comfort will improve Outcome: Progressing   Problem: Safety: Goal: Ability to remain free from injury will improve Outcome: Progressing   Problem: Skin Integrity: Goal: Risk for impaired skin integrity will decrease Outcome: Progressing Note: Patient's skin was kept C&D at all times to prevent skin breakdown. Patient was repositioned q2hrs.

## 2022-07-05 NOTE — Consult Note (Signed)
   [facility] CM Inpatient Consult   07/05/2022  Laura Carpenter [birth year] [id number]  [facility] [[facility]]  Accountable Care Organization [ACO] Patient: Medicare ACO REACH  Primary Care Provider:  Dana Allan, MD with [place] [facility]  Patient screened for less than 30 days readmission hospitalization with noted low risk score for unplanned readmission risk  and to assess for potential [facility]  [[facility]] Care Management service needs for post hospital transition for care coordination.  Review of patient's electronic medical record reveals patient is with Altered Mental Status with hx of subdural hematoma with a HX of Dementia per electronic medical record.  Review noted patient is from [facility] per electronic medical record MD H&P notes. Came by on rounds, patient up in recliner asleep [snoring] and no family at the bedside, did not awaken.   Plan:  Continue to follow progress and disposition to assess for post hospital community care coordination/management needs.  Referral request for community care coordination: disposition is currently not determined, will follow up.  Of note, [facility] Care Management/Population Health does not replace or interfere with any arrangements made by the Inpatient Transition of Care team.  For questions contact:   Charlesetta Shanks, RN BSN CCM [place] [facility]  [phone] business mobile phone Toll free office [phone]  *Concierge Line  [phone] Fax number: [phone] [email or web address]

## 2022-07-05 NOTE — Progress Notes (Signed)
Subjective: No acute events overnight.  Able to name objects but still not following commands  ROS: Unable to obtain due to poor mental status  Examination  Vital signs in last 24 hours: Temp:  [97.5 F (36.4 C)-98 F (36.7 C)] 98 F (36.7 C) (06/11 0743) Pulse Rate:  [59-74] 68 (06/11 1100) Resp:  [16-18] 18 (06/11 1100) BP: (110-159)/(9-97) 141/92 (06/11 1100) SpO2:  [98 %-100 %] 100 % (06/11 1100)  General: lying in bed, NAD Neuro: Alert, able to tell me her name, did not answer rest of the orientation questions and kept perseverating on her name, able to name objects but did not follow commands, cranial nerves appear grossly intact, spontaneously moving all 4 extremities in bed  Basic Metabolic Panel: Recent Labs  Lab 07/02/22 1041 07/03/22 1648 07/04/22 0752 07/05/22 0434  NA 130* 132* 127* 132*  K 4.4 3.7 3.4* 3.9  CL 93* 94* 93* 97*  CO2 26 25 24 25   GLUCOSE 115* 104* 136* 92  BUN 12 9 5* 7*  CREATININE 0.61 0.72 0.54 0.62  CALCIUM 9.8 9.4 8.6* 8.9  MG  --   --  1.7  --     CBC: Recent Labs  Lab 07/02/22 1041 07/03/22 1648 07/04/22 0752 07/05/22 0434  WBC 9.3 14.4* 12.1* 7.1  HGB 15.3* 13.9 12.3 12.5  HCT 45.3 41.2 36.9 36.4  MCV 94.4 96.7 95.6 95.3  PLT 364 363 293 321     Coagulation Studies: No results for input(s): "LABPROT", "INR" in the last 72 hours.  Imaging No new brain imaging overnight  ASSESSMENT AND PLAN:  87 year old female with history of dementia, chronic hyponatremia presented with right-sided twitching consistent with focal motor seizure.  CT head showed left subdural hematoma.   Seizures Subdural hematoma Dementia -No seizures on EEG but continues to have Wernicke's aphasia, could be postictal versus due to medications.  Also could be prolonged recovery due to poor neurocognitive reserve in the setting of dementia  Recommendations -Reduce Keppra to 500 mg twice daily -DC LTM EEG as no seizures -Did consider MRI brain  without contrast.  However patient may not be able to lay still for the imaging.  The subdural hematoma does explain her seizure and therefore will defer MRI for now.  May need to reconsider if patient continues to remain aphasic after another 48 to 72 hours -Management of rest of medical comorbidities per primary team -Continue seizure precautions -As needed IV Versed for clinical seizures -Plan discussed with Dr. Rito Ehrlich via secure chat   I have spent a total of 36 minutes with the patient reviewing hospital notes,  test results, labs and examining the patient as well as establishing an assessment and plan.  > 50% of time was spent in direct patient care.    Lindie Spruce Epilepsy [facility] For questions after 5pm please refer to AMION to reach the Neurologist on call

## 2022-07-05 NOTE — Progress Notes (Signed)
Speech Language Pathology Treatment: Dysphagia  Patient Details Name: LAPORSCHA LINEHAN MRN: [id number] DOB: [birth year] Today's Date: 07/05/2022 Time: 4098-1191 SLP Time Calculation (min) (ACUTE ONLY): 12 min  Assessment / Plan / Recommendation Clinical Impression  Pt seen for diet tolerance and trials of upgraded textures. Pt received upright in recliner. Pt perseveratively attempting attempting to blow nose; however, dropping tissue prior to being able to do so successfully. Flat affect. Limited verbalizations. Pt with prolonged/inefficient mastication of solids with "munching" pattern of mastication. Otherwise, oral swallow was functional.  Pharyngeal swallow appeared [facility] per clinical assessment. Recommend diet upgrade to Dysphagia 2 with Thin Liquids with safe swallowing strategies/aspiration precautions as outlined below. SLP to f/u per POC for dysphagia management. Anticipate further improvement in pt's oral swallow function with improvement in mentation.    HPI HPI: Pt is an 87 y.o. female who presented secondary to AMS and abdominal distension. CT head negative for acute changes. EEG 6/10: cortical dysfunction arising from left temporal region likely secondary to underlying subdural hematoma. No seizures or epileptiform discharges. CXR 6/8: COPD. There is prominence of central pulmonary vessels and interstitial markings in mid and lower lung fields suggesting possible CHF. There is no focal pulmonary consolidation. PMH: dementia, HTN, HLD, hypothyroidism. Pt recently admitted to [facility] for fall/SDH and hyponatremia ~3 weeks prior to this admission.      SLP Plan  Continue with current plan of care      Recommendations for follow up therapy are one component of a multi-disciplinary discharge planning process, led by the attending physician.  Recommendations may be updated based on patient status, additional functional criteria and insurance authorization.    Recommendations  Diet  recommendations: Dysphagia 2 (fine chop);Thin liquid Medication Administration: Crushed with puree Supervision: Full supervision/cueing for compensatory strategies Compensations: Slow rate;Small sips/bites;Minimize environmental distractions Postural Changes and/or Swallow Maneuvers: Out of bed for meals;Seated upright 90 degrees;Upright 30-60 min after meal                  Oral care BID   Frequent or constant Supervision/Assistance Dysphagia, unspecified (R13.10)     Continue with current plan of care    Clyde Canterbury, M.S., CCC-SLP Speech-Language Pathologist Secure Chat Preferred  O: [phone]  Woodroe Chen  07/05/2022, 12:41 PM

## 2022-07-05 NOTE — Evaluation (Signed)
Physical Therapy Evaluation Patient Details Name: Laura Carpenter MRN: [id number] DOB: 07-Feb-1935 Today's Date: 07/05/2022  History of Present Illness  Pt is an 87 y.o. female who presented 07/03/2022 secondary to AMS and abdominal distension. CT head negative for acute changes. EEG 6/10: cortical dysfunction arising from left temporal region likely secondary to underlying subdural hematoma. No seizures or epileptiform discharges. CXR 6/8: COPD. There is prominence of central pulmonary vessels and interstitial markings in mid and lower lung fields suggesting possible CHF. There is no focal pulmonary consolidation. PMH: dementia, HTN, HLD, hypothyroidism. Pt recently admitted to [facility] for fall/SDH and hyponatremia ~3 weeks prior to this admission.   Clinical Impression  Pt extremely lethargic upon entrance to room, able to arouse w verbal stimulus but returns to eyes closed immediately. Unable to assess any change in PLOF. Pt oriented to self but unable to comment further. Pt with max cueing to initiate BLE navigation to EOB, requires max A to achieve seated, uses BUE to maintain support but presents with significant posterior lean. Pt follows command to reach out and touch therapist's hand while in seated, demoing single extremity support and anterior weight shifting. Pt step pivot transfer mod A x2 person using RW to the recliner. Pt will continue to benefit from skilled physical therapy during their acute admission to facilitate improvements in functional transfers. PT to recommend continued inpatient follow up therapy, <3 hours/day.     Recommendations for follow up therapy are one component of a multi-disciplinary discharge planning process, led by the attending physician.  Recommendations may be updated based on patient status, additional functional criteria and insurance authorization.  Follow Up Recommendations Can patient physically be transported by private vehicle: No     Assistance Recommended  at Discharge Frequent or constant Supervision/Assistance  Patient can return home with the following  Assist for transportation;Help with stairs or ramp for entrance;Assistance with cooking/housework;Direct supervision/assist for medications management;A lot of help with walking and/or transfers;A lot of help with bathing/dressing/bathroom    Equipment Recommendations None recommended by PT  Recommendations for Other Services       Functional Status Assessment Patient has had a recent decline in their functional status and demonstrates the ability to make significant improvements in function in a reasonable and predictable amount of time.     Precautions / Restrictions Precautions Precautions: Fall Restrictions Weight Bearing Restrictions: No      Mobility  Bed Mobility Overal bed mobility: Needs Assistance Bed Mobility: Supine to Sit     Supine to sit: Max assist     General bed mobility comments: requires mod verbal cues for initiation of BLE navigation EOB, max A to complete upright seated.    Transfers Overall transfer level: Needs assistance Equipment used: 2 person hand held assist Transfers: Sit to/from Stand, Bed to chair/wheelchair/BSC Sit to Stand: Mod assist, +2 physical assistance   Step pivot transfers: Mod assist, +2 physical assistance       General transfer comment: Inadequate anterior weight shift and lacks push-off power to acheive stand, takes 4-5 steps with slow turning and increased knee flexion to transfer to recliner. Poor eccentric control to seat.    Ambulation/Gait                  Stairs            Wheelchair Mobility    Modified Rankin (Stroke Patients Only)       Balance Overall balance assessment: Needs assistance Sitting-balance support: Feet supported, Bilateral upper  extremity supported, Single extremity supported Sitting balance-Leahy Scale: Good   Postural control: Posterior lean Standing balance support:  During functional activity, Bilateral upper extremity supported Standing balance-Leahy Scale: Poor Standing balance comment: external support required                             Pertinent Vitals/Pain      Home Living Family/patient expects to be discharged to:: Private residence Living Arrangements: Spouse/significant other Available Help at Discharge: Family Type of Home: House Home Access: Level entry       Home Layout: Full bath on main level;Able to live on main level with bedroom/bathroom;Two level Home Equipment: Rollator (4 wheels);Cane - single point;BSC/3in1;Shower seat;Grab bars - tub/shower Additional Comments: Pt unable to discuss home living set up at this time due to lethargy and baseline cognitive deficits, information routed from OT eval 06/19/22    Prior Function Prior Level of Function : Needs assist;Patient poor historian/Family not available  Cognitive Assist : ADLs (cognitive)             ADLs Comments: Prior to most recent admission, pt indep with ADLs. Husband drives and manages her medications. Pt has dementia.     Hand Dominance   Dominant Hand: Right    Extremity/Trunk Assessment   Upper Extremity Assessment Upper Extremity Assessment: Generalized weakness    Lower Extremity Assessment Lower Extremity Assessment: Generalized weakness    Cervical / Trunk Assessment Cervical / Trunk Assessment: Kyphotic  Communication   Communication: HOH;Other (comment) (mumbled speech throughout)  Cognition Arousal/Alertness: Lethargic Behavior During Therapy: WFL for tasks assessed/performed Overall Cognitive Status: History of cognitive impairments - at baseline                                          General Comments      Exercises     Assessment/Plan    PT Assessment Patient needs continued PT services  PT Problem List Decreased strength;Decreased activity tolerance;Decreased balance;Decreased  mobility;Decreased cognition;Decreased knowledge of use of DME;Decreased safety awareness;Decreased knowledge of precautions       PT Treatment Interventions DME instruction;Gait training;Stair training;Functional mobility training;Therapeutic activities;Therapeutic exercise;Balance training;Neuromuscular re-education;Cognitive remediation;Patient/family education    PT Goals (Current goals can be found in the Care Plan section)  Acute Rehab PT Goals Patient Stated Goal: not stated PT Goal Formulation: With patient Time For Goal Achievement: 07/12/22 Potential to Achieve Goals: Fair    Frequency Min 3X/week     Co-evaluation               AM-PAC PT "6 Clicks" Mobility  Outcome Measure Help needed turning from your back to your side while in a flat bed without using bedrails?: A Lot Help needed moving from lying on your back to sitting on the side of a flat bed without using bedrails?: A Lot Help needed moving to and from a bed to a chair (including a wheelchair)?: A Lot Help needed standing up from a chair using your arms (e.g., wheelchair or bedside chair)?: A Lot Help needed to walk in hospital room?: Total Help needed climbing 3-5 steps with a railing? : Total 6 Click Score: 10    End of Session Equipment Utilized During Treatment: Gait belt Activity Tolerance: Patient tolerated treatment well Patient left: in chair;with call bell/phone within reach;with chair alarm set;with restraints reapplied Nurse Communication:  Mobility status PT Visit Diagnosis: Unsteadiness on feet (R26.81);Muscle weakness (generalized) (M62.81)    Time: 1030-1055 PT Time Calculation (min) (ACUTE ONLY): 25 min   Charges:   PT Evaluation $PT Eval Moderate Complexity: 1 Mod PT Treatments $Therapeutic Activity: 8-22 mins        Hendricks Milo, SPT  Acute Rehabilitation Services   Laura Carpenter 07/05/2022, 1:40 PM

## 2022-07-06 DIAGNOSIS — E876 Hypokalemia: Secondary | ICD-10-CM | POA: Insufficient documentation

## 2022-07-06 DIAGNOSIS — I480 Paroxysmal atrial fibrillation: Secondary | ICD-10-CM | POA: Diagnosis not present

## 2022-07-06 DIAGNOSIS — E871 Hypo-osmolality and hyponatremia: Secondary | ICD-10-CM | POA: Diagnosis not present

## 2022-07-06 DIAGNOSIS — R569 Unspecified convulsions: Secondary | ICD-10-CM | POA: Diagnosis not present

## 2022-07-06 LAB — CBC
HCT: 44.1 % (ref 36.0–46.0)
Hemoglobin: 15.2 g/dL — ABNORMAL HIGH (ref 12.0–15.0)
MCH: 31.8 pg (ref 26.0–34.0)
MCHC: 34.5 g/dL (ref 30.0–36.0)
MCV: 92.3 fL (ref 80.0–100.0)
Platelets: 354 10*3/uL (ref 150–400)
RBC: 4.78 MIL/uL (ref 3.87–5.11)
RDW: 12.8 % (ref 11.5–15.5)
WBC: 8.8 10*3/uL (ref 4.0–10.5)
nRBC: 0 % (ref 0.0–0.2)

## 2022-07-06 LAB — BASIC METABOLIC PANEL
Anion gap: 9 (ref 5–15)
BUN: 6 mg/dL — ABNORMAL LOW (ref 8–23)
CO2: 27 mmol/L (ref 22–32)
Calcium: 9.2 mg/dL (ref 8.9–10.3)
Chloride: 92 mmol/L — ABNORMAL LOW (ref 98–111)
Creatinine, Ser: 0.59 mg/dL (ref 0.44–1.00)
GFR, Estimated: >60 mL/min (ref >60)
Glucose, Bld: 99 mg/dL (ref 70–99)
Potassium: 4 mmol/L (ref 3.5–5.1)
Sodium: 128 mmol/L — ABNORMAL LOW (ref 135–145)

## 2022-07-06 LAB — MAGNESIUM: Magnesium: 2 mg/dL (ref 1.7–2.4)

## 2022-07-06 NOTE — Progress Notes (Signed)
Subjective: NAEO. Sitting in chair  ROS: negative except above  Examination  Vital signs in last 24 hours: Temp:  [97.6 F (36.4 C)-98.3 F (36.8 C)] 98.2 F (36.8 C) (06/12 0755) Pulse Rate:  [65-83] 82 (06/12 0755) Resp:  [18-20] 20 (06/12 0755) BP: (139-174)/(88-120) 161/101 (06/12 0755) SpO2:  [97 %-100 %] 98 % (06/12 0755)  General: sitting in chair, NAD Neuro: MS: Alert, oriented to self and place: hospital, not to time ( does have dementia so could be baseline), follows simple one step commands but not consistently, does name objects and able to do simple math CN: pupils equal and reactive,  EOMI, face symmetric, tongue midline, normal sensation over face, Motor: antigravity strength in all 4 extremities  Basic Metabolic Panel: Recent Labs  Lab 07/02/22 1041 07/03/22 1648 07/04/22 0752 07/05/22 0434 07/06/22 0242  NA 130* 132* 127* 132* 128*  K 4.4 3.7 3.4* 3.9 4.0  CL 93* 94* 93* 97* 92*  CO2 26 25 24 25 27   GLUCOSE 115* 104* 136* 92 99  BUN 12 9 5* 7* 6*  CREATININE 0.61 0.72 0.54 0.62 0.59  CALCIUM 9.8 9.4 8.6* 8.9 9.2  MG  --   --  1.7  --  2.0    CBC: Recent Labs  Lab 07/02/22 1041 07/03/22 1648 07/04/22 0752 07/05/22 0434 07/06/22 0242  WBC 9.3 14.4* 12.1* 7.1 8.8  HGB 15.3* 13.9 12.3 12.5 15.2*  HCT 45.3 41.2 36.9 36.4 44.1  MCV 94.4 96.7 95.6 95.3 92.3  PLT 364 363 293 321 354   Coagulation Studies: No results for input(s): "LABPROT", "INR" in the last 72 hours.  Imaging No new brain imaging overnight   ASSESSMENT AND PLAN:  87 year old female with history of dementia, chronic hyponatremia presented with right-sided twitching consistent with focal motor seizure.  CT head showed left subdural hematoma.   Seizures Subdural hematoma Dementia - Seizures likely due to SDH   Recommendations -Continue Keppra to 500 mg twice daily. If patient's improvement plateaus, can consider reducing to 250mg  BID - No further inpatient workup.  -  Called and updated daughter  -Continue seizure precautions -As needed IV Versed for clinical seizures -Management of rest of medical comorbidities per primary team -Plan discussed with Dr. Maryfrances Bunnell via secure chat   I have spent a total of 36 minutes with the patient reviewing hospital notes,  test results, labs and examining the patient as well as establishing an assessment and plan.  > 50% of time was spent in direct patient care.  Lindie Spruce Epilepsy [facility] For questions after 5pm please refer to AMION to reach the Neurologist on call

## 2022-07-06 NOTE — Progress Notes (Signed)
TELE monitor and foley d/c'ed per MD order. Patient is DTV at 2330.

## 2022-07-06 NOTE — Assessment & Plan Note (Signed)
Supplemented 

## 2022-07-06 NOTE — Progress Notes (Signed)
  Progress Note   Patient: Laura Carpenter QMV:[id number] DOB: 15-Dec-1935 DOA: 07/03/2022     3 DOS: the patient was seen and examined on 07/06/2022 at 8:52AM      Brief hospital course: Laura Carpenter is an 87 y.o. F with dementia, HTN, HLD, hypothyroidism and recently admitted for fall/SDH and hyponatremia requiring 3% saline to [facility] about 3 weeks ago, discharged to SNF who returned for decreased mentation and belly distension.  Admitted, Neurology consulted, placed on LTM EEG, found to have seizures due to her SDH.       Assessment and Plan: * Seizures (HCC) - Continue Keppra, dosing per Neurology     Paroxysmal atrial fibrillation with RVR New onset in ER after placement of foley, patient developed Afib and hypotension.  No prior history of same.  She was started on amiodarone briefly, and given fluids and her BP improved and she converted back to sinus.  Has been in and out of sinus rhythm here. - No anticoagulation   - Continue amiodarone  Subdural hematoma (HCC) Left frontal convexity, 6mm.  CT this admission shows expected interval evolution, no worsening.  Acute metabolic encephalopathy Here, she is somnolent, and not oriented to person, place or time, and poorly responsive.  Presumably this is post-ictal.  Recent TSH, cortisol and B12 all normal.  Ammonia not high enough to explain this.  No focal signs of infection.  Improving slightly. - Keppra dosing per Neurology  Hypokalemia Supplemented  Acute urinary retention Noted to have belly distension on arrival to ER (this was actually the chief complaint).  Foley placed and >1L urine output.   - Remove foley - Continue Flomax  Dementia (HCC) At baseline is ambulatory, conversational, with just some mild short term memory deficits. - Continue rivastigmine  Obstructive sleep apnea - Resume CPAP  Essential hypertension BP soft - Hold amlodipine and benazepril   Hypothyroidism, acquired TSH normal recently -  Continue levothyroxine  Mixed hyperlipidemia - Continue Lipitor  Hyponatremia Na roughly stable within baseline range.  She had acute on chronic hyponatremia last month at [facility], treated with hypertonic saline.  This level is on par with her baseline off and on over the last 15 years. - Trend Na          Subjective: No nursing concersn.  Patient is confused but more alert than admission.  Oriented to slef only, asking to go home.  No further seizures, no fever.     Physical Exam: BP (!) 141/97 (BP Location: Left Arm)   Pulse 79   Temp 98.2 F (36.8 C) (Oral)   Resp 20   SpO2 94%   Elderly adult female, lying in bed, makes eye contact, responds to questions RRR, no murmurs, no peripheral edema Respiratory rate normal, lungs diminished but no rales or wheezes Abdomen soft no distention, no grimace to palpation She is weak and tired, speech is fluent however, face symmetric, she has generalized weakness, severe, oriented to self only.  Data Reviewed: Basic metabolic panel shows sodium 128, magnesium normal CBC normal        Disposition: Status is: Inpatient         Author: Alberteen Sam, MD 07/06/2022 2:51 PM  For on call review [email or web address].

## 2022-07-06 NOTE — Care Plan (Signed)
Called daughter , no answer

## 2022-07-06 NOTE — Care Management Important Message (Signed)
Important Message  Patient Details  Name: ALEANA FIFITA MRN: [id number] Date of Birth: [birth year]   Medicare Important Message Given:  Yes     Dennis Hegeman Stefan Church 07/06/2022, 4:29 PM

## 2022-07-07 DIAGNOSIS — R569 Unspecified convulsions: Secondary | ICD-10-CM | POA: Diagnosis not present

## 2022-07-07 DIAGNOSIS — I480 Paroxysmal atrial fibrillation: Secondary | ICD-10-CM | POA: Diagnosis not present

## 2022-07-07 DIAGNOSIS — E871 Hypo-osmolality and hyponatremia: Secondary | ICD-10-CM | POA: Diagnosis not present

## 2022-07-07 LAB — COMPREHENSIVE METABOLIC PANEL
ALT: 26 U/L (ref 0–44)
AST: 26 U/L (ref 15–41)
Albumin: 2.5 g/dL — ABNORMAL LOW (ref 3.5–5.0)
Alkaline Phosphatase: 137 U/L — ABNORMAL HIGH (ref 38–126)
Anion gap: 7 (ref 5–15)
BUN: 9 mg/dL (ref 8–23)
CO2: 27 mmol/L (ref 22–32)
Calcium: 8.7 mg/dL — ABNORMAL LOW (ref 8.9–10.3)
Chloride: 93 mmol/L — ABNORMAL LOW (ref 98–111)
Creatinine, Ser: 0.63 mg/dL (ref 0.44–1.00)
GFR, Estimated: >60 mL/min (ref >60)
Glucose, Bld: 104 mg/dL — ABNORMAL HIGH (ref 70–99)
Potassium: 3.5 mmol/L (ref 3.5–5.1)
Sodium: 127 mmol/L — ABNORMAL LOW (ref 135–145)
Total Bilirubin: 1 mg/dL (ref 0.3–1.2)
Total Protein: 5.5 g/dL — ABNORMAL LOW (ref 6.5–8.1)

## 2022-07-07 LAB — CBC
HCT: 36 % (ref 36.0–46.0)
Hemoglobin: 12.6 g/dL (ref 12.0–15.0)
MCH: 32.1 pg (ref 26.0–34.0)
MCHC: 35 g/dL (ref 30.0–36.0)
MCV: 91.6 fL (ref 80.0–100.0)
Platelets: 385 10*3/uL (ref 150–400)
RBC: 3.93 MIL/uL (ref 3.87–5.11)
RDW: 12.9 % (ref 11.5–15.5)
WBC: 6.4 10*3/uL (ref 4.0–10.5)
nRBC: 0 % (ref 0.0–0.2)

## 2022-07-07 MED ORDER — SODIUM CHLORIDE 1 G PO TABS
1.0000 g | ORAL_TABLET | Freq: Two times a day (BID) | ORAL | Status: DC
  Administered 2022-07-07 – 2022-07-11 (×8): 1 g via ORAL
  Filled 2022-07-07 (×8): qty 1

## 2022-07-07 NOTE — Plan of Care (Signed)

## 2022-07-07 NOTE — Progress Notes (Signed)
Speech Language Pathology Treatment: Dysphagia  Patient Details Name: Laura Carpenter MRN: [id number] DOB: 1935-07-17 Today's Date: 07/07/2022 Time: 4098-1191 SLP Time Calculation (min) (ACUTE ONLY): 31 min  Assessment / Plan / Recommendation Clinical Impression  Pt was seen for dysphagia treatment with her husband and son present. Pt's son stated that he is pleased regarding the pt's progress, and questions what the next steps should be in terms of discharge. Pt was seen during lunch and consumed a meal of mashed potatoes, finely chopped beef pot roast, chopped collard greens, and thin liquids via straw and cup. Delayed throat clearing was noted twice with thin liquids via cup, but pt otherwise tolerated solids and liquids without overt s/s of aspiration. Mastication was functional with dysphagia 2 solids and mild-moderately prolonged with regular texture solids. Oral clearance was adequate without significant oral residue. However, towards the end of the session, pt began removing boluses of meat from the oral cavity after minimal mastication. Diet advancement was discussed with the pt's family; however, they advised that they would prefer to keep the pt on her current diet since she seems to be tolerating it well and their priority is her being able to eat as much as possible. It was agreed that diet advancement to dysphagia 3 will therefore be deferred, but SLP will continue to follow pt.     HPI HPI: Pt is an 87 y.o. female who presented secondary to AMS and abdominal distension. CT head negative for acute changes. EEG 6/10: cortical dysfunction arising from left temporal region likely secondary to underlying subdural hematoma. No seizures or epileptiform discharges. CXR 6/8: COPD. There is prominence of central pulmonary vessels and interstitial markings in mid and lower lung fields suggesting possible CHF. There is no focal pulmonary consolidation. PMH: dementia, HTN, HLD, hypothyroidism. Pt recently  admitted to [facility] for fall/SDH and hyponatremia ~3 weeks prior to this admission.      SLP Plan  Continue with current plan of care      Recommendations for follow up therapy are one component of a multi-disciplinary discharge planning process, led by the attending physician.  Recommendations may be updated based on patient status, additional functional criteria and insurance authorization.    Recommendations  Diet recommendations: Dysphagia 2 (fine chop);Thin liquid Liquids provided via: Cup;Straw Medication Administration: Crushed with puree Supervision: Full supervision/cueing for compensatory strategies Compensations: Slow rate;Small sips/bites;Minimize environmental distractions Postural Changes and/or Swallow Maneuvers: Seated upright 90 degrees;Upright 30-60 min after meal                  Oral care BID   Frequent or constant Supervision/Assistance Dysphagia, unspecified (R13.10)     Continue with current plan of care   Dayden Viverette I. Vear Clock, MS, CCC-SLP Neuro Diagnostic Specialist  Acute Rehabilitation Services Office number: [phone]   Scheryl Marten  07/07/2022, 1:56 PM

## 2022-07-07 NOTE — Progress Notes (Signed)
  Progress Note   Patient: Laura Carpenter UJW:[id number] DOB: 28-Sep-1935 DOA: 07/03/2022     4 DOS: the patient was seen and examined on 07/07/2022        Brief hospital course: Laura Carpenter is an 87 y.o. F with dementia, HTN, HLD, hypothyroidism and recently admitted for fall/SDH and hyponatremia requiring 3% saline to [facility] about 3 weeks ago, discharged to SNF who returned for decreased mentation and belly distension.  Admitted, Neurology consulted, placed on LTM EEG, found to have seizures due to her SDH.       Assessment and Plan: * Seizures (HCC) - Continue Keppra    Paroxysmal atrial fibrillation with RVR - Continue amiodarone   Acute metabolic encephalopathy Improving again slightly. - Keppra dosing per Neurology  Acute urinary retention Doing well without foley - Continue Flomax  Dementia (HCC) - Continue rivastigmine  Obstructive sleep apnea  Essential hypertension BP normal - Hold amlodipine and benazepril   Hypothyroidism, acquired - Continue levothyroxine  Mixed hyperlipidemia - Continue Lipitor  Hyponatremia Trending down.  Given her medical condition, I am concerned fluid restriction is not safe. - Start salt tabs         Subjective: Gradually getting better, no new seizures, fever, respiratory symptoms.     Physical Exam: BP 119/80 (BP Location: Right Arm)   Pulse 75   Temp 98.2 F (36.8 C) (Oral)   Resp 20   SpO2 97%   Elderly adult female, sitting up in bed, eating breakfast, responds to questions slowly RRR, no murmurs, no peripheral edema Respiratory rate normal, lungs clear without rales or wheezes Abdomen soft without tenderness palpation Speech slow, halting, face symmetric, generalized weakness, oriented to self only    Data Reviewed: Basic metabolic panel shows sodium down to 127 CBC unremarkable  Family Communication: Daughter by phone    Disposition: Status is: Inpatient         Author: Alberteen Sam, MD 07/07/2022 4:47 PM  For on call review [email or web address].

## 2022-07-07 NOTE — Progress Notes (Signed)
Physical Therapy Treatment Patient Details Name: Laura Carpenter MRN: [id number] DOB: 11/13/1935 Today's Date: 07/07/2022   History of Present Illness Pt is an 87 y.o. female who presented 07/03/2022 secondary to AMS and abdominal distension. CT head negative for acute changes. EEG 6/10: cortical dysfunction arising from left temporal region likely secondary to underlying subdural hematoma. No seizures or epileptiform discharges. CXR 6/8: COPD. There is prominence of central pulmonary vessels and interstitial markings in mid and lower lung fields suggesting possible CHF. There is no focal pulmonary consolidation. PMH: dementia, HTN, HLD, hypothyroidism. Pt recently admitted to [facility] for fall/SDH and hyponatremia ~3 weeks prior to this admission. l   PT Comments    Pt supine in bed on arrival this session.  Pt continues to benefit from skilled rehab in a post acute setting to maximize functional gains before returning home.  She remains flat but did follow commands during session with increased time and effort.   Recommendations for follow up therapy are one component of a multi-disciplinary discharge planning process, led by the attending physician.  Recommendations may be updated based on patient status, additional functional criteria and insurance authorization.  Follow Up Recommendations  Can patient physically be transported by private vehicle: No    Assistance Recommended at Discharge Frequent or constant Supervision/Assistance  Patient can return home with the following Assist for transportation;Help with stairs or ramp for entrance;Assistance with cooking/housework;Direct supervision/assist for medications management;A lot of help with walking and/or transfers;A lot of help with bathing/dressing/bathroom   Equipment Recommendations  None recommended by PT    Recommendations for Other Services       Precautions / Restrictions Precautions Precautions: Fall Restrictions Weight Bearing  Restrictions: No     Mobility  Bed Mobility Overal bed mobility: Needs Assistance Bed Mobility: Supine to Sit, Sit to Supine     Supine to sit: Mod assist     General bed mobility comments: Increased time and effort but able to move to edge of bed with tactile cues.    Transfers Overall transfer level: Needs assistance Equipment used: 1 person hand held assist (face to face with BHHA on patients shoulders.) Transfers: Sit to/from Stand Sit to Stand: Mod assist Stand pivot transfers: Mod assist         General transfer comment: Pt performed pivot from bed to recliner with posterior lean.  Once seated in recliner performed repeated sit to stand holding to patient's hand and pulling into standing with good effort.    Ambulation/Gait Ambulation/Gait assistance:  (refused ambulation despite education and encouragement.)                 Stairs             Wheelchair Mobility    Modified Rankin (Stroke Patients Only)       Balance Overall balance assessment: Needs assistance Sitting-balance support: Feet supported, Bilateral upper extremity supported, Single extremity supported Sitting balance-Leahy Scale: Good       Standing balance-Leahy Scale: Poor                              Cognition Arousal/Alertness: Lethargic Behavior During Therapy: Flat affect Overall Cognitive Status: History of cognitive impairments - at baseline                                 General Comments: wanted tissues.  Exercises General Exercises - Lower Extremity Long Arc Quad: AROM, Both, 10 reps, Seated Shoulder Exercises Shoulder Flexion: AROM, Both, 10 reps, Seated    General Comments        Pertinent Vitals/Pain Pain Assessment Pain Assessment: Faces Pain Descriptors / Indicators: Grimacing Pain Intervention(s): Monitored during session, Repositioned    Home Living                          Prior Function             PT Goals (current goals can now be found in the care plan section) Acute Rehab PT Goals Patient Stated Goal: not stated Potential to Achieve Goals: Fair Progress towards PT goals: Progressing toward goals    Frequency    Min 3X/week      PT Plan Current plan remains appropriate    Co-evaluation              AM-PAC PT "6 Clicks" Mobility   Outcome Measure  Help needed turning from your back to your side while in a flat bed without using bedrails?: A Lot Help needed moving from lying on your back to sitting on the side of a flat bed without using bedrails?: A Lot Help needed moving to and from a bed to a chair (including a wheelchair)?: A Lot Help needed standing up from a chair using your arms (e.g., wheelchair or bedside chair)?: A Lot Help needed to walk in hospital room?: Total Help needed climbing 3-5 steps with a railing? : Total 6 Click Score: 10    End of Session Equipment Utilized During Treatment: Gait belt Activity Tolerance: Patient tolerated treatment well Patient left: in chair;with call bell/phone within reach;with chair alarm set Nurse Communication: Mobility status PT Visit Diagnosis: Unsteadiness on feet (R26.81);Muscle weakness (generalized) (M62.81)     Time: 1914-7829 PT Time Calculation (min) (ACUTE ONLY): 23 min  Charges:  $Therapeutic Exercise: 8-22 mins $Therapeutic Activity: 8-22 mins                     Bonney Leitz , PTA Acute Rehabilitation Services Office [phone]    Rithik Odea Artis Delay 07/07/2022, 1:31 PM

## 2022-07-08 DIAGNOSIS — I480 Paroxysmal atrial fibrillation: Secondary | ICD-10-CM | POA: Diagnosis not present

## 2022-07-08 DIAGNOSIS — R569 Unspecified convulsions: Secondary | ICD-10-CM | POA: Diagnosis not present

## 2022-07-08 DIAGNOSIS — E871 Hypo-osmolality and hyponatremia: Secondary | ICD-10-CM | POA: Diagnosis not present

## 2022-07-08 LAB — BASIC METABOLIC PANEL
Anion gap: 8 (ref 5–15)
BUN: 9 mg/dL (ref 8–23)
CO2: 26 mmol/L (ref 22–32)
Calcium: 8.9 mg/dL (ref 8.9–10.3)
Chloride: 92 mmol/L — ABNORMAL LOW (ref 98–111)
Creatinine, Ser: 0.72 mg/dL (ref 0.44–1.00)
GFR, Estimated: >60 mL/min (ref >60)
Glucose, Bld: 102 mg/dL — ABNORMAL HIGH (ref 70–99)
Potassium: 3.9 mmol/L (ref 3.5–5.1)
Sodium: 126 mmol/L — ABNORMAL LOW (ref 135–145)

## 2022-07-08 LAB — CBC
HCT: 38.5 % (ref 36.0–46.0)
Hemoglobin: 13.4 g/dL (ref 12.0–15.0)
MCH: 32.1 pg (ref 26.0–34.0)
MCHC: 34.8 g/dL (ref 30.0–36.0)
MCV: 92.3 fL (ref 80.0–100.0)
Platelets: 399 10*3/uL (ref 150–400)
RBC: 4.17 MIL/uL (ref 3.87–5.11)
RDW: 13.1 % (ref 11.5–15.5)
WBC: 5 10*3/uL (ref 4.0–10.5)
nRBC: 0 % (ref 0.0–0.2)

## 2022-07-08 LAB — SODIUM, URINE, RANDOM: Sodium, Ur: 33 mmol/L

## 2022-07-08 LAB — OSMOLALITY, URINE: Osmolality, Ur: 233 mOsm/kg — ABNORMAL LOW (ref 300–900)

## 2022-07-08 MED ORDER — LEVETIRACETAM 500 MG PO TABS
500.0000 mg | ORAL_TABLET | Freq: Two times a day (BID) | ORAL | Status: DC
  Administered 2022-07-08 – 2022-07-14 (×12): 500 mg via ORAL
  Filled 2022-07-08 (×12): qty 1

## 2022-07-08 NOTE — Progress Notes (Signed)
  Progress Note   Patient: Laura Carpenter ZOX:[id number] DOB: [birth year] DOA: 07/03/2022     5 DOS: the patient was seen and examined on 07/08/2022 at 9:42AM      Brief hospital course: Laura Carpenter is an 87 y.o. F with dementia, HTN, HLD, hypothyroidism and recently admitted for fall/SDH and hyponatremia requiring 3% saline to [facility] about 3 weeks ago, discharged to SNF who returned for decreased mentation and belly distension.  Admitted, Neurology consulted, placed on LTM EEG, found to have seizures due to her SDH.       Assessment and Plan: * Seizures (HCC) - Continue Keppra, change to oral     Paroxysmal atrial fibrillation with RVR New onset in ER after placement of foley, patient developed Afib and hypotension.  No prior history of same.  She was started on amiodarone briefly, and given fluids and her BP improved and she converted back to sinus.  Has been in and out of sinus rhythm here. - No anticoagulation   - Continue amiodarone  Hyponatremia Na baseline has fluctuated throughout the years, as low as 130s at time, recently higher.  Now trending down to 126 in the last week.    - Continue  - Start fluid restriction - Check urine studies - Continue salt tabs  Acute metabolic encephalopathy Here, she is somnolent, and not oriented to person, place or time, and poorly responsive.  Presumably this is post-ictal.  Recent TSH, cortisol and B12 all normal.  Ammonia not high enough to explain this.  No focal signs of infection.  Some persistent delirium is present.  Subdural hematoma (HCC) Left frontal convexity, 6mm.  CT this admission shows expected interval evolution, no worsening.  Hypokalemia Supplemented  Acute urinary retention Noted to have belly distension on arrival to ER (this was actually the chief complaint).  Foley placed and >1L urine output.    Here, trialed off foley for last 24 hours and unable to void at all, requiring repeat I/O caths.   - Continue  Flomax - Replace foley  Dementia (HCC) At baseline is ambulatory, conversational, with just some mild short term memory deficits. - Continue rivastigmine  Obstructive sleep apnea    Essential hypertension BP soft - Hold amlodipine and benazepril   Hypothyroidism, acquired TSH normal recently - Continue levothyroxine  Mixed hyperlipidemia - Continue Lipitor          Subjective: Still delirious, no fever.  No respiratory complaints.  Asking for water.       Physical Exam: BP 137/89 (BP Location: Left Arm)   Pulse 66   Temp 98.2 F (36.8 C) (Oral)   Resp 18   SpO2 98%   Elderly adult female, sitting up in bed, eating breakfast, responds to questions inappropriately at the end attentively RRR, no murmurs, no peripheral edema Respiratory rate normal, lungs clear without rales or wheezes Abdomen soft tenderness palpation Speech slow, halting, face symmetric Generalized weakness, oriented to self, repetitive, not oriented to place or situation    Data Reviewed: Basic metabolic panel shows sodium down to 126 CBC unremarkable     Disposition: Status is: Inpatient Sodium worsening, given her prior seizures, will need to adjust treatment in inpatient setting until stabilized and improving        Author: Alberteen Sam, MD 07/08/2022 2:58 PM  For on call review [email or web address].

## 2022-07-08 NOTE — Plan of Care (Signed)
  Problem: Education: Goal: Knowledge of General Education information will improve Description: Including pain rating scale, medication(s)/side effects and non-pharmacologic comfort measures Outcome: Progressing   Problem: Clinical Measurements: Goal: Respiratory complications will improve Outcome: Progressing Goal: Cardiovascular complication will be avoided Outcome: Progressing   Problem: Activity: Goal: Risk for activity intolerance will decrease Outcome: Progressing   Problem: Nutrition: Goal: Adequate nutrition will be maintained Outcome: Progressing   Problem: Elimination: Goal: Will not experience complications related to urinary retention Outcome: Progressing   Problem: Pain Managment: Goal: General experience of comfort will improve Outcome: Progressing   Problem: Skin Integrity: Goal: Risk for impaired skin integrity will decrease Outcome: Progressing

## 2022-07-08 NOTE — TOC Progression Note (Signed)
Transition of Care [facility]) - Progression Note    Patient Details  Name: Laura Carpenter MRN: [id number] Date of Birth: Jan 08, 1936  [facility]) CM/SW Contact  Baldemar Lenis, [place] Phone Number: 07/08/2022, 2:14 PM  Clinical Narrative:   CSW spoke with [facility] to discuss rehab need and sent referral. Patient and spouse do have a contract written but have not signed it yet, but [facility] will still take the patient for rehab. Bed won't be available until Monday. CSW contacted daughter, Almyra Free, to provide update. CSW to follow.    Expected Discharge Plan: Skilled Nursing Facility Barriers to Discharge: Continued Medical Work up  Expected Discharge Plan and Services     Post Acute Care Choice: Skilled Nursing Facility Living arrangements for the past 2 months: Single Family Home                                       Social Determinants of Health (SDOH) Interventions SDOH Screenings   Food Insecurity: No Food Insecurity (06/22/2022)  Housing: Low Risk  (06/22/2022)  Transportation Needs: No Transportation Needs (06/22/2022)  Utilities: Not At Risk (06/22/2022)  Depression (PHQ2-9): Low Risk  (02/22/2022)  Tobacco Use: Medium Risk (07/03/2022)    Readmission Risk Interventions     No data to display

## 2022-07-08 NOTE — Progress Notes (Signed)
Bladder scan done @1015  showed 377 ml, tried to help her to void using BSC but patient didn't void. So, Foley catheter inserted, output was . Will continue to monitor

## 2022-07-08 NOTE — Progress Notes (Signed)
Physical Therapy Treatment Patient Details Name: VELNA IADAROLA MRN: [id number] DOB: September 12, 1935 Today's Date: 07/08/2022   History of Present Illness Pt is an 87 y.o. female who presented 07/03/2022 secondary to AMS and abdominal distension. CT head negative for acute changes. EEG 6/10: cortical dysfunction arising from left temporal region likely secondary to underlying subdural hematoma. No seizures or epileptiform discharges. CXR 6/8: COPD. There is prominence of central pulmonary vessels and interstitial markings in mid and lower lung fields suggesting possible CHF. There is no focal pulmonary consolidation. PMH: dementia, HTN, HLD, hypothyroidism. Pt recently admitted to [facility] for fall/SDH and hyponatremia ~3 weeks prior to this admission.    PT Comments    Pt supine in bed on arrival.  She is more alert.  Attempted sit to stand with RW.  She required increased assistance to stand as trials progressed but still remains unable to step into gt training at this time.  Will continue PT per POC.      Recommendations for follow up therapy are one component of a multi-disciplinary discharge planning process, led by the attending physician.  Recommendations may be updated based on patient status, additional functional criteria and insurance authorization.  Follow Up Recommendations  Can patient physically be transported by private vehicle: No    Assistance Recommended at Discharge Frequent or constant Supervision/Assistance  Patient can return home with the following Assist for transportation;Help with stairs or ramp for entrance;Assistance with cooking/housework;Direct supervision/assist for medications management;A lot of help with walking and/or transfers;A lot of help with bathing/dressing/bathroom   Equipment Recommendations  None recommended by PT    Recommendations for Other Services       Precautions / Restrictions Precautions Precautions: Fall Restrictions Weight Bearing Restrictions:  No     Mobility  Bed Mobility Overal bed mobility: Needs Assistance Bed Mobility: Supine to Sit, Sit to Supine     Supine to sit: Mod assist     General bed mobility comments: Increased time and effort but able to move to edge of bed with tactile cues. Assistance for LE advancement and trunk elevation.    Transfers Overall transfer level: Needs assistance Equipment used: Rolling walker (2 wheels) Transfers: Sit to/from Stand Sit to Stand: Mod assist     Squat pivot transfers: Max assist     General transfer comment: Performed multiple sit to stand reps from bed to stand in RW.  Presents with strong posterior lean and poor posture.  Cues for head and trunk control.  Attempted to initiate gt to see if posture would correct but unable to progress steps and returned to sitting each time.    Ambulation/Gait Ambulation/Gait assistance:  (Remains unable to progress steps at this time, would benefit from sara + sit to stand lift with foot plate removed to progress gt.)                 Stairs             Wheelchair Mobility    Modified Rankin (Stroke Patients Only)       Balance     Sitting balance-Leahy Scale: Fair       Standing balance-Leahy Scale: Poor                              Cognition Arousal/Alertness: Lethargic Behavior During Therapy: Flat affect Overall Cognitive Status: History of cognitive impairments - at baseline  Exercises      General Comments        Pertinent Vitals/Pain Pain Assessment Pain Assessment: Faces Faces Pain Scale: Hurts little more Pain Location: generalized Pain Descriptors / Indicators: Grimacing Pain Intervention(s): Monitored during session, Repositioned    Home Living                          Prior Function            PT Goals (current goals can now be found in the care plan section) Acute Rehab PT Goals Patient  Stated Goal: not stated Potential to Achieve Goals: Fair Progress towards PT goals: Progressing toward goals    Frequency    Min 3X/week      PT Plan Current plan remains appropriate    Co-evaluation              AM-PAC PT "6 Clicks" Mobility   Outcome Measure  Help needed turning from your back to your side while in a flat bed without using bedrails?: A Lot Help needed moving from lying on your back to sitting on the side of a flat bed without using bedrails?: A Lot Help needed moving to and from a bed to a chair (including a wheelchair)?: A Lot Help needed standing up from a chair using your arms (e.g., wheelchair or bedside chair)?: A Lot Help needed to walk in hospital room?: Total Help needed climbing 3-5 steps with a railing? : Total 6 Click Score: 10    End of Session Equipment Utilized During Treatment: Gait belt Activity Tolerance: Patient tolerated treatment well Patient left: in chair;with call bell/phone within reach;with chair alarm set Nurse Communication: Mobility status PT Visit Diagnosis: Unsteadiness on feet (R26.81);Muscle weakness (generalized) (M62.81)     Time: 0981-1914 PT Time Calculation (min) (ACUTE ONLY): 23 min  Charges:  $Therapeutic Activity: 23-37 mins                     Bonney Leitz , PTA Acute Rehabilitation Services Office [phone]    Florestine Avers 07/08/2022, 5:23 PM

## 2022-07-08 NOTE — Progress Notes (Signed)
(  6/13) 2200 - Bladder scan 400. In/Out cath done, emptied  (6/14) 0400 - Bladder scan >864mL. In/Out cath done, emptied 

## 2022-07-08 NOTE — TOC Initial Note (Signed)
Transition of Care [facility]) - Initial/Assessment Note    Patient Details  Name: Laura Carpenter MRN: [id number] Date of Birth: [birth year]  Transition of Care [facility]) CM/SW Contact:    Baldemar Lenis, LCSW Phone Number: 07/08/2022, 2:12 PM  Clinical Narrative:          Patient from [facility] for rehab. CSW spoke with [facility], they are following for patient to return when stable. CSW spoke with daughter, Almyra Free, to discuss return to [facility]. Patient and spouse are working on signing a contract to move into [facility], daughter is hopeful that patient could admit to [facility] to continue rehab as opposed to [facility]. [facility] would be second choice if [facility] is unavailable. CSW to contact [facility] and ask about availability.         Expected Discharge Plan: Skilled Nursing Facility Barriers to Discharge: Continued Medical Work up   Patient Goals and CMS Choice Patient states their goals for this hospitalization and ongoing recovery are:: patient unable to participate in goal setting, not fully oriented CMS Medicare.gov Compare Post Acute Care list provided to:: Patient Represenative (must comment) Choice offered to / list presented to : Adult Children Divide ownership interest in [facility].provided to:: Adult Children    Expected Discharge Plan and Services     Post Acute Care Choice: Skilled Nursing Facility Living arrangements for the past 2 months: Single Family Home                                      Prior Living Arrangements/Services Living arrangements for the past 2 months: Single Family Home Lives with:: Spouse Patient language and need for interpreter reviewed:: No Do you feel safe going back to the place where you live?: Yes      Need for Family Participation in Patient Care: Yes (Comment) Care giver support system in place?: No (comment)   Criminal Activity/Legal Involvement Pertinent to Current  Situation/Hospitalization: No - Comment as needed  Activities of Daily Living      Permission Sought/Granted Permission sought to share information with : Facility Medical sales representative, Family Supports Permission granted to share information with : Yes, Verbal Permission Granted  Share Information with NAME: Almyra Free  Permission granted to share info w AGENCY: SNF  Permission granted to share info w Relationship: Daughter     Emotional Assessment   Attitude/Demeanor/Rapport: Unable to Assess Affect (typically observed): Unable to Assess Orientation: : Oriented to Self Alcohol / Substance Use: Not Applicable Psych Involvement: No (comment)  Admission diagnosis:  Seizures (HCC) [R56.9] Patient Active Problem List   Diagnosis Date Noted   Hypokalemia 07/06/2022   Seizures (HCC) 07/03/2022   Paroxysmal atrial fibrillation with RVR 07/03/2022   Acute urinary retention 07/03/2022   Protein-calorie malnutrition, severe 06/20/2022   Subdural hematoma (HCC) 06/18/2022   Acute metabolic encephalopathy 06/18/2022   Physical deconditioning 01/03/2022   Breast nodule 11/11/2021   Weight loss 12/09/2020   Balance problem 10/28/2020   Hyponatremia 06/28/2018   Mixed hyperlipidemia 06/28/2018   History of breast cancer 06/28/2018   B12 deficiency 06/28/2018   Skin cancer    Hypothyroidism, acquired    Essential hypertension    Obstructive sleep apnea    Fibromuscular dysplasia of renal artery (HCC)    Dementia (HCC)    Secondary polycythemia 10/17/2014   Hearing loss 03/23/2012   PCP:  Clent Ridges,  Kenney Houseman, MD Pharmacy:   [facility] [place], [place] - [address] [place] [address] Phone: [phone] Fax: [phone]  [id number] [place], [place] - [address] AT [facility] OF [address] [place] [address] Phone: [phone] Fax: [phone]     Social Determinants of Health (SDOH) Social  History: SDOH Screenings   Food Insecurity: No Food Insecurity (06/22/2022)  Housing: Low Risk  (06/22/2022)  Transportation Needs: No Transportation Needs (06/22/2022)  Utilities: Not At Risk (06/22/2022)  Depression (PHQ2-9): Low Risk  (02/22/2022)  Tobacco Use: Medium Risk (07/03/2022)   SDOH Interventions:     Readmission Risk Interventions     No data to display

## 2022-07-09 DIAGNOSIS — I480 Paroxysmal atrial fibrillation: Secondary | ICD-10-CM | POA: Diagnosis not present

## 2022-07-09 DIAGNOSIS — E871 Hypo-osmolality and hyponatremia: Secondary | ICD-10-CM | POA: Diagnosis not present

## 2022-07-09 DIAGNOSIS — R569 Unspecified convulsions: Secondary | ICD-10-CM | POA: Diagnosis not present

## 2022-07-09 LAB — CBC
HCT: 31.9 % — ABNORMAL LOW (ref 36.0–46.0)
Hemoglobin: 11.1 g/dL — ABNORMAL LOW (ref 12.0–15.0)
MCH: 33.1 pg (ref 26.0–34.0)
MCHC: 34.8 g/dL (ref 30.0–36.0)
MCV: 95.2 fL (ref 80.0–100.0)
Platelets: 330 10*3/uL (ref 150–400)
RBC: 3.35 MIL/uL — ABNORMAL LOW (ref 3.87–5.11)
RDW: 13.2 % (ref 11.5–15.5)
WBC: 4.8 10*3/uL (ref 4.0–10.5)
nRBC: 0 % (ref 0.0–0.2)

## 2022-07-09 LAB — BASIC METABOLIC PANEL
Anion gap: 8 (ref 5–15)
BUN: 10 mg/dL (ref 8–23)
CO2: 26 mmol/L (ref 22–32)
Calcium: 8.5 mg/dL — ABNORMAL LOW (ref 8.9–10.3)
Chloride: 92 mmol/L — ABNORMAL LOW (ref 98–111)
Creatinine, Ser: 0.64 mg/dL (ref 0.44–1.00)
GFR, Estimated: >60 mL/min (ref >60)
Glucose, Bld: 100 mg/dL — ABNORMAL HIGH (ref 70–99)
Potassium: 4.1 mmol/L (ref 3.5–5.1)
Sodium: 126 mmol/L — ABNORMAL LOW (ref 135–145)

## 2022-07-09 LAB — OSMOLALITY: Osmolality: 267 mOsm/kg — ABNORMAL LOW (ref 275–295)

## 2022-07-09 NOTE — Plan of Care (Signed)
  Problem: Clinical Measurements: Goal: Cardiovascular complication will be avoided Outcome: Progressing   Problem: Activity: Goal: Risk for activity intolerance will decrease Outcome: Progressing   Problem: Nutrition: Goal: Adequate nutrition will be maintained Outcome: Progressing   Problem: Coping: Goal: Level of anxiety will decrease Outcome: Progressing   Problem: Skin Integrity: Goal: Risk for impaired skin integrity will decrease Outcome: Progressing   

## 2022-07-09 NOTE — Progress Notes (Signed)
  Progress Note   Patient: Laura Carpenter WUJ:[id number] DOB: 1936-01-16 DOA: 07/03/2022     6 DOS: the patient was seen and examined on 07/09/2022 at 10:21AM      Brief hospital course: Mrs. Tysinger is an 87 y.o. F with dementia, HTN, HLD, hypothyroidism and recently admitted for fall/SDH and hyponatremia requiring 3% saline to [facility] about 3 weeks ago, discharged to SNF who returned for decreased mentation and belly distension.  Admitted, Neurology consulted, placed on LTM EEG, found to have seizures due to her SDH.       Assessment and Plan: * Seizures (HCC) - Continue Keppra, change to oral   Paroxysmal atrial fibrillation with RVR - No anticoagulation   - Continue amiodarone   Hyponatremia Na stable today. Urine studies showed SIADH. - Continue salt tabs - Continue mild fluid restriction - Daily BMP   Acute metabolic encephalopathy Dementia (HCC) At baseline is ambulatory, conversational, with just some mild short term memory deficits. Stable, still not back to her baseline - Needs PT OT - Continue rivastigmine   Acute urinary retention - Continue Flomax - Maintain foley - Voiding trial in 1 week   Obstructive sleep apnea    Essential hypertension BP soft - Hold amlodipine and benazepril    Hypothyroidism, acquired TSH normal recently - Continue levothyroxine   Mixed hyperlipidemia - Continue Lipitor          Subjective: Delirium stable, no new fever, respiratory complaints.  No nursing concerns.     Physical Exam: BP 101/67 (BP Location: Right Arm)   Pulse 63   Temp 97.9 F (36.6 C) (Oral)   Resp 17   SpO2 96%   Frail elderly adult female, lying in bed, upright, sleeping, arouses, but then falls back asleep RRR, no murmurs, no peripheral edema Respiratory rate normal, lungs clear without rales or wheezes but overall diminished Abdomen soft, no grimace to palpation She has severe generalized weakness, she is oriented to self, but not  oriented to place or situation, her speech is slow and halting, face is symmetric, mostly just goes back to sleep, does not follow two-step commands    Data Reviewed: Basic metabolic panel shows sodium stable at 126 Urinalysis and urine electrolytes show SIADH    Family Communication: Daughter by phone   Disposition: Status is: Inpatient        Author: Alberteen Sam, MD 07/09/2022 2:44 PM  For on call review [email or web address].

## 2022-07-10 DIAGNOSIS — E871 Hypo-osmolality and hyponatremia: Secondary | ICD-10-CM | POA: Diagnosis not present

## 2022-07-10 DIAGNOSIS — J189 Pneumonia, unspecified organism: Secondary | ICD-10-CM | POA: Insufficient documentation

## 2022-07-10 DIAGNOSIS — R569 Unspecified convulsions: Secondary | ICD-10-CM | POA: Diagnosis not present

## 2022-07-10 DIAGNOSIS — I480 Paroxysmal atrial fibrillation: Secondary | ICD-10-CM | POA: Diagnosis not present

## 2022-07-10 LAB — CBC
HCT: 32.5 % — ABNORMAL LOW (ref 36.0–46.0)
Hemoglobin: 11.1 g/dL — ABNORMAL LOW (ref 12.0–15.0)
MCH: 31.8 pg (ref 26.0–34.0)
MCHC: 34.2 g/dL (ref 30.0–36.0)
MCV: 93.1 fL (ref 80.0–100.0)
Platelets: 351 10*3/uL (ref 150–400)
RBC: 3.49 MIL/uL — ABNORMAL LOW (ref 3.87–5.11)
RDW: 13.7 % (ref 11.5–15.5)
WBC: 5.7 10*3/uL (ref 4.0–10.5)
nRBC: 0 % (ref 0.0–0.2)

## 2022-07-10 LAB — BASIC METABOLIC PANEL
Anion gap: 7 (ref 5–15)
BUN: 15 mg/dL (ref 8–23)
CO2: 26 mmol/L (ref 22–32)
Calcium: 8.4 mg/dL — ABNORMAL LOW (ref 8.9–10.3)
Chloride: 95 mmol/L — ABNORMAL LOW (ref 98–111)
Creatinine, Ser: 0.77 mg/dL (ref 0.44–1.00)
GFR, Estimated: >60 mL/min (ref >60)
Glucose, Bld: 97 mg/dL (ref 70–99)
Potassium: 4.4 mmol/L (ref 3.5–5.1)
Sodium: 128 mmol/L — ABNORMAL LOW (ref 135–145)

## 2022-07-10 MED ORDER — ENSURE ENLIVE PO LIQD
237.0000 mL | Freq: Two times a day (BID) | ORAL | Status: DC
  Administered 2022-07-10 – 2022-07-14 (×7): 237 mL via ORAL

## 2022-07-10 MED ORDER — SODIUM CHLORIDE 0.9 % IV SOLN
2.0000 g | INTRAVENOUS | Status: DC
  Administered 2022-07-10 – 2022-07-12 (×3): 2 g via INTRAVENOUS
  Filled 2022-07-10 (×3): qty 20

## 2022-07-10 NOTE — Plan of Care (Signed)

## 2022-07-10 NOTE — Progress Notes (Signed)
  Progress Note   Patient: Laura Carpenter ZOX:[id number] DOB: August 26, 1935 DOA: 07/03/2022     7 DOS: the patient was seen and examined on 07/10/2022        Brief hospital course: Laura Carpenter is an 87 y.o. F with dementia, HTN, HLD, hypothyroidism and recently admitted for fall/SDH and hyponatremia requiring 3% saline to [facility] about 3 weeks ago, discharged to SNF who returned for decreased mentation and belly distension.  Admitted, Neurology consulted, placed on LTM EEG, found to have seizures due to her SDH.       Assessment and Plan: * Seizures (HCC) - Continue Keppra      Paroxysmal atrial fibrillation with RVR - No anticoagulation   - Continue amiodarone  Hyponatremia Na slightly better today - Continue salt tabs - Continue mild fluid restriction  Acute metabolic encephalopathy No change, still delirious.    Suspected pneumonia Has a bad cough and poor pulmonary toilet.  I am concerned with her low grade temp she has a pneumonia. - Start rocephin  Subdural hematoma (HCC)  Dementia (HCC) - Continue rivastigmine  Essential hypertension BP soft - Hold amlodipine and benazepril   Hypothyroidism, acquired TSH normal recently - Continue levothyroxine  Mixed hyperlipidemia - Continue Lipitor          Subjective: Mentation poor.  Cough noted.  Temp low grade.       Physical Exam: BP 126/76 (BP Location: Left Arm)   Pulse 63   Temp 98.2 F (36.8 C) (Oral)   Resp 18   SpO2 94%   Frail elderly adult female, lying in bed, sleeping, has a very weak cough RRR, no murmurs, no peripheral edema Respiratory rate diminished, lungs diminished, really too weak and tired and unable to cooperate with exam so I cannot hear if there is any rales or wheezing Abdomen without grimace to palpation, no distention Severe generalized weakness, really today does not wake up enough to answer my questions    Data Reviewed: Patient metabolic panel shows sodium up to  128 CBC shows mild anemia      Disposition: Status is: Inpatient         Author: Alberteen Sam, MD 07/10/2022 11:37 AM  For on call review [email or web address].

## 2022-07-11 DIAGNOSIS — E871 Hypo-osmolality and hyponatremia: Secondary | ICD-10-CM | POA: Diagnosis not present

## 2022-07-11 DIAGNOSIS — R569 Unspecified convulsions: Secondary | ICD-10-CM | POA: Diagnosis not present

## 2022-07-11 DIAGNOSIS — I480 Paroxysmal atrial fibrillation: Secondary | ICD-10-CM | POA: Diagnosis not present

## 2022-07-11 LAB — CBC
HCT: 35.9 % — ABNORMAL LOW (ref 36.0–46.0)
Hemoglobin: 12.1 g/dL (ref 12.0–15.0)
MCH: 32.4 pg (ref 26.0–34.0)
MCHC: 33.7 g/dL (ref 30.0–36.0)
MCV: 96.2 fL (ref 80.0–100.0)
Platelets: 364 10*3/uL (ref 150–400)
RBC: 3.73 MIL/uL — ABNORMAL LOW (ref 3.87–5.11)
RDW: 13.7 % (ref 11.5–15.5)
WBC: 5.8 10*3/uL (ref 4.0–10.5)
nRBC: 0 % (ref 0.0–0.2)

## 2022-07-11 LAB — BASIC METABOLIC PANEL
Anion gap: 9 (ref 5–15)
BUN: 13 mg/dL (ref 8–23)
CO2: 26 mmol/L (ref 22–32)
Calcium: 8.8 mg/dL — ABNORMAL LOW (ref 8.9–10.3)
Chloride: 96 mmol/L — ABNORMAL LOW (ref 98–111)
Creatinine, Ser: 0.71 mg/dL (ref 0.44–1.00)
GFR, Estimated: >60 mL/min (ref >60)
Glucose, Bld: 99 mg/dL (ref 70–99)
Potassium: 4.1 mmol/L (ref 3.5–5.1)
Sodium: 131 mmol/L — ABNORMAL LOW (ref 135–145)

## 2022-07-11 NOTE — Progress Notes (Signed)
  Progress Note   Patient: Laura Carpenter:[id number] DOB: 06-Oct-1935 DOA: 07/03/2022     8 DOS: the patient was seen and examined on 07/11/2022 at 10:05AM      Brief hospital course: Mrs. Morr is an 87 y.o. F with dementia, HTN, HLD, hypothyroidism and recently admitted for fall/SDH and hyponatremia requiring 3% saline to [facility] about 3 weeks ago, discharged to SNF who returned for decreased mentation and belly distension.  Admitted, Neurology consulted, placed on LTM EEG, found to have seizures due to her SDH.       Assessment and Plan: * Seizures (HCC) No further seizrues - Continue Keppra     Paroxysmal atrial fibrillation with RVR - Continue amiodarone  Hyponatremia Na up to 132 - Stop salt tabs - Trend Na   Subdural hematoma (HCC) Acute metabolic encephalopathy - Consult Palliative care    Possible pneumonia - Continue rocephin  Acute urinary retention - Continue Rocephin  Dementia (HCC) - Continue rivastigmine  Essential hypertension BP remains soft - Hold amlodipine and benazepril   Hypothyroidism, acquired TSH normal recently - Continue levothyroxine  Mixed hyperlipidemia - Continue Lipitor          Subjective: Mentation is improved today, overall physical function still very bad.  Really only oriented to self, recognizes family, not oriented to situation or place.     Physical Exam: BP (!) 148/90 (BP Location: Left Arm)   Pulse 64   Temp 98.2 F (36.8 C) (Oral)   Resp 20   SpO2 97%   Frail elderly female, lying in bed, no acute distress RRR, no murmurs, no peripheral edema Respiratory normal, lung sounds severely diminished, I do not appreciate rales or wheezes Abdomen without grimace to palpation, no distention Severe generalized weakness, opens eyes and asked to go home, 4/5 strength in all 4 extremities.    Data Reviewed: Basic metabolic panel shows sodium up to 131, creatinine stable at 0.7 CBC unremarkable  Family  Communication: Daughter and husband    Disposition: Status is: Inpatient         Author: Alberteen Sam, MD 07/11/2022 4:56 PM  For on call review [email or web address].

## 2022-07-11 NOTE — Progress Notes (Signed)
Physical Therapy Treatment Patient Details Name: Laura Carpenter MRN: [id number] DOB: 27-Mar-1935 Today's Date: 07/11/2022   History of Present Illness Pt is an 86 y.o. female who presented 07/03/2022 secondary to AMS and abdominal distension. CT head negative for acute changes. EEG 6/10: cortical dysfunction arising from left temporal region likely secondary to underlying subdural hematoma. No seizures or epileptiform discharges. CXR 6/8: COPD. There is prominence of central pulmonary vessels and interstitial markings in mid and lower lung fields suggesting possible CHF. There is no focal pulmonary consolidation. PMH: dementia, HTN, HLD, hypothyroidism. Pt recently admitted to [facility] for fall/SDH and hyponatremia ~3 weeks prior to this admission.    PT Comments    Pt with decline in functional mobility and therapy participation today. Treatment session focused on bed mobility and transfers. Pt with decreased initiation for bed rail grasp and BLE navigation towards EOB, requiring max A x1 person to achieve upright seated position. Pt presents with significant increase in forward trunk flexion and neck flexion in seated, holding gaze towards the floor and unable to extend back and neck without therapist assist. Pt attempts to stand x2 person totalA to RW but unable to achieve upright posture, incontinent on second attempt and pt returned to supine for cleaning. Max A for rolling and repositioning, soiled sacral dressing changed. Pt will continue to benefit from skilled PT at this time to facilitate improvements in functional mobility and activity tolerance.   Recommendations for follow up therapy are one component of a multi-disciplinary discharge planning process, led by the attending physician.  Recommendations may be updated based on patient status, additional functional criteria and insurance authorization.  Follow Up Recommendations  Can patient physically be transported by private vehicle: No     Assistance Recommended at Discharge Frequent or constant Supervision/Assistance  Patient can return home with the following Assist for transportation;Help with stairs or ramp for entrance;Assistance with cooking/housework;Direct supervision/assist for medications management;A lot of help with walking and/or transfers;A lot of help with bathing/dressing/bathroom   Equipment Recommendations  None recommended by PT    Recommendations for Other Services       Precautions / Restrictions Precautions Precautions: Fall Restrictions Weight Bearing Restrictions: No     Mobility  Bed Mobility Overal bed mobility: Needs Assistance Bed Mobility: Rolling, Supine to Sit, Sit to Supine Rolling: Max assist   Supine to sit: Max assist, HOB elevated Sit to supine: Max assist, +2 for physical assistance, HOB elevated   General bed mobility comments: Unable to initiate bed rail grasp today, resists assist for BLE navigation to EOB. Max A for upright positioning and hip scooting EOB.    Transfers Overall transfer level: Needs assistance Equipment used: Rolling walker (2 wheels) Transfers: Sit to/from Stand Sit to Stand: +2 physical assistance, Total assist           General transfer comment: Pt with 2 75% stands to RW with maxA x2 person, clears hips from surface but unable to stand upright. Increased forward flexion and neck flexion, unable to look up today.    Ambulation/Gait                   Stairs             Wheelchair Mobility    Modified Rankin (Stroke Patients Only)       Balance Overall balance assessment: Needs assistance Sitting-balance support: Single extremity supported, Feet supported Sitting balance-Leahy Scale: Fair Sitting balance - Comments: Pt requires max A for seated balance EOB  today, presents with increased forward trunk flexion and neck flexion, gaze towards floor. Fatigue progresses quickly. Postural control: Posterior lean Standing  balance support: During functional activity, Bilateral upper extremity supported Standing balance-Leahy Scale: Zero Standing balance comment: Unable to complete stand today, requires external support and maxA x2 person to achieve 75% stand. Unable to weight shift at all.                            Cognition Arousal/Alertness: Lethargic Behavior During Therapy: Flat affect Overall Cognitive Status: History of cognitive impairments - at baseline                                 General Comments: Less interactive with therapy today, repeats statement "get me up" throughout session        Exercises      General Comments General comments (skin integrity, edema, etc.): Pt with event of incontinence on attempt to stand, returned to supine and rolled for cleanup. PT changed soiled sacral dressing with new foam. Unweighted R side w pillow, positioned bed in chair form.      Pertinent Vitals/Pain Pain Assessment Pain Assessment: No/denies pain    Home Living                          Prior Function            PT Goals (current goals can now be found in the care plan section) Acute Rehab PT Goals Patient Stated Goal: not stated PT Goal Formulation: With patient Time For Goal Achievement: 07/12/22 Potential to Achieve Goals: Fair Progress towards PT goals: Not progressing toward goals - comment (Increased lethargy today, unable to participate like prior sessions)    Frequency    Min 3X/week      PT Plan Current plan remains appropriate    Co-evaluation              AM-PAC PT "6 Clicks" Mobility   Outcome Measure  Help needed turning from your back to your side while in a flat bed without using bedrails?: A Lot Help needed moving from lying on your back to sitting on the side of a flat bed without using bedrails?: A Lot Help needed moving to and from a bed to a chair (including a wheelchair)?: A Lot Help needed standing up from a  chair using your arms (e.g., wheelchair or bedside chair)?: A Lot Help needed to walk in hospital room?: Total Help needed climbing 3-5 steps with a railing? : Total 6 Click Score: 10    End of Session Equipment Utilized During Treatment: Gait belt Activity Tolerance: Patient limited by lethargy Patient left: in bed;with call bell/phone within reach;with bed alarm set Nurse Communication: Mobility status PT Visit Diagnosis: Unsteadiness on feet (R26.81);Muscle weakness (generalized) (M62.81)     Time: 1610-9604 PT Time Calculation (min) (ACUTE ONLY): 34 min  Charges:  $Therapeutic Activity: 23-37 mins                     Hendricks Milo, SPT  Acute Rehabilitation Services    Laura Carpenter 07/11/2022, 4:46 PM

## 2022-07-11 NOTE — Progress Notes (Signed)
1114: Patient's BP noted 192/100 (126) MAP. Patient asymptomatic. Patient denies CP or SOB.  1126: Writer administered PRN Labetalol 10 mg IVP, per order.   1310: Patient's BP rechecked, currently 148/90 (107) MAP.   Will continue to monitor patient. Patient's family members at the bedside.

## 2022-07-12 ENCOUNTER — Ambulatory Visit: Payer: Medicare Other | Admitting: Neurosurgery

## 2022-07-12 DIAGNOSIS — R569 Unspecified convulsions: Secondary | ICD-10-CM | POA: Diagnosis not present

## 2022-07-12 DIAGNOSIS — I480 Paroxysmal atrial fibrillation: Secondary | ICD-10-CM | POA: Diagnosis not present

## 2022-07-12 DIAGNOSIS — G301 Alzheimer's disease with late onset: Secondary | ICD-10-CM

## 2022-07-12 DIAGNOSIS — Z66 Do not resuscitate: Secondary | ICD-10-CM

## 2022-07-12 DIAGNOSIS — E871 Hypo-osmolality and hyponatremia: Secondary | ICD-10-CM | POA: Diagnosis not present

## 2022-07-12 DIAGNOSIS — R338 Other retention of urine: Secondary | ICD-10-CM

## 2022-07-12 DIAGNOSIS — F02B0 Dementia in other diseases classified elsewhere, moderate, without behavioral disturbance, psychotic disturbance, mood disturbance, and anxiety: Secondary | ICD-10-CM

## 2022-07-12 DIAGNOSIS — R627 Adult failure to thrive: Secondary | ICD-10-CM

## 2022-07-12 LAB — CBC
HCT: 34.2 % — ABNORMAL LOW (ref 36.0–46.0)
Hemoglobin: 11.5 g/dL — ABNORMAL LOW (ref 12.0–15.0)
MCH: 31.6 pg (ref 26.0–34.0)
MCHC: 33.6 g/dL (ref 30.0–36.0)
MCV: 94 fL (ref 80.0–100.0)
Platelets: 343 10*3/uL (ref 150–400)
RBC: 3.64 MIL/uL — ABNORMAL LOW (ref 3.87–5.11)
RDW: 13.6 % (ref 11.5–15.5)
WBC: 8.4 10*3/uL (ref 4.0–10.5)
nRBC: 0 % (ref 0.0–0.2)

## 2022-07-12 LAB — BASIC METABOLIC PANEL
Anion gap: 9 (ref 5–15)
BUN: 12 mg/dL (ref 8–23)
CO2: 27 mmol/L (ref 22–32)
Calcium: 8.8 mg/dL — ABNORMAL LOW (ref 8.9–10.3)
Chloride: 93 mmol/L — ABNORMAL LOW (ref 98–111)
Creatinine, Ser: 0.85 mg/dL (ref 0.44–1.00)
GFR, Estimated: >60 mL/min (ref >60)
Glucose, Bld: 115 mg/dL — ABNORMAL HIGH (ref 70–99)
Potassium: 3.9 mmol/L (ref 3.5–5.1)
Sodium: 129 mmol/L — ABNORMAL LOW (ref 135–145)

## 2022-07-12 MED ORDER — AMOXICILLIN-POT CLAVULANATE 875-125 MG PO TABS
1.0000 | ORAL_TABLET | Freq: Two times a day (BID) | ORAL | Status: DC
  Administered 2022-07-12 – 2022-07-14 (×4): 1 via ORAL
  Filled 2022-07-12 (×4): qty 1

## 2022-07-12 MED ORDER — SODIUM CHLORIDE 1 G PO TABS
1.0000 g | ORAL_TABLET | Freq: Two times a day (BID) | ORAL | Status: DC
  Administered 2022-07-12 – 2022-07-14 (×5): 1 g via ORAL
  Filled 2022-07-12 (×5): qty 1

## 2022-07-12 NOTE — Plan of Care (Signed)
  Problem: Education: Goal: Knowledge of General Education information will improve Description Including pain rating scale, medication(s)/side effects and non-pharmacologic comfort measures Outcome: Progressing   

## 2022-07-12 NOTE — Progress Notes (Signed)
Initial Nutrition Assessment  DOCUMENTATION CODES:   Severe malnutrition in context of chronic illness  INTERVENTION:  Continue DYS 2 diet as recommended per SLP Encourage PO intake Feeding assistance Ensure Enlive po BID, each supplement provides 350 kcal and 20 grams of protein. Obtain new measured weight Monitor GOC discussions. If family desires aggressive care and interventions, may benefit from cortrak to optimize nutritionally   NUTRITION DIAGNOSIS:   Severe Malnutrition related to chronic illness as evidenced by severe fat depletion, severe muscle depletion.  GOAL:   Patient will meet greater than or equal to 90% of their needs  MONITOR:   PO intake, Supplement acceptance, Weight trends  REASON FOR ASSESSMENT:  LOS, Malnutrition Screening Tool    ASSESSMENT:   Pt with hx of dementia, HTN, HLD, chronic hyponatremia, and recent admission to [facility] after a fall with SDH presented to ED from SNF with AMS and distended abdomen. Imaging in ED showed evolution of SDH and significant urinary retention  6/10 - SLP evaluation, DYS 1, nectar 6/11 - SLP evaluation DYS 3, thin  Pt resting in bed at the time of assessment. No family in room at this time to provide a hx. Pt conversant, but not able to provide much of a hx. Breakfast tray at bedside noted to be poorly consumed. Pt was agreeable to a few sips of ensure when they were offered to her. Significant muscle and fat depletions present on exam.   Will adjust to automatic trays. Several ensures at bedside, 1 unopened and 1 partially consumed.   Noted that PMT has been consulted. Will monitor GOC discussions to determine how aggressive family would like to be with nutrition interventions.    Average Meal Intake: 6/11-6/16: 23% intake x 8 recorded meals  Nutritionally Relevant Medications: Scheduled Meds:  atorvastatin  10 mg Oral q1800   feeding supplement  237 mL Oral BID BM   mirtazapine  7.5 mg Oral QHS    rivastigmine  9.5 mg Transdermal Daily   Continuous Infusions:  cefTRIAXone (ROCEPHIN)  IV Stopped (07/12/22 0910)   PRN Meds: ondansetron  Labs Reviewed: Na 129, chloride 93  NUTRITION - FOCUSED PHYSICAL EXAM:  Flowsheet Row Most Recent Value  Orbital Region Moderate depletion  Upper Arm Region Severe depletion  Thoracic and Lumbar Region Severe depletion  Buccal Region Moderate depletion  Temple Region Moderate depletion  Clavicle Bone Region Severe depletion  Clavicle and Acromion Bone Region Severe depletion  Scapular Bone Region Severe depletion  Dorsal Hand Severe depletion  Patellar Region Severe depletion  Anterior Thigh Region Severe depletion  Posterior Calf Region Severe depletion  Edema (RD Assessment) None  Hair Reviewed  Eyes Reviewed  Mouth Reviewed  Skin Reviewed  Nails Reviewed       Diet Order:   Diet Order             DIET DYS 2 Room service appropriate? Yes with Assist; Fluid consistency: Thin  Diet effective now                   EDUCATION NEEDS:   Not appropriate for education at this time  Skin:  Skin Assessment: Reviewed RN Assessment  Last BM:  6/16  Height:   Ht Readings from Last 1 Encounters:  07/02/22 5\' 7"  (1.702 m)    Weight:   Wt Readings from Last 1 Encounters:  07/02/22 54 kg    Ideal Body Weight:  61.4 kg  BMI:  There is no height or weight on  file to calculate BMI.  Estimated Nutritional Needs:   Kcal:  1500-1800 kcal/d  Protein:  70-90g/d  Fluid:  >/=1.5L/d    Greig Castilla, RD, LDN Clinical Dietitian RD pager # available in [facility]  After hours/weekend pager # available in [facility]

## 2022-07-12 NOTE — Consult Note (Signed)
Consultation Note Date: 07/12/2022   Patient Name: Laura Carpenter  DOB: 07-Feb-1935  MRN: [id number]  Age / Sex: 87 y.o., female  PCP: Dana Allan, MD Referring Physician: Alberteen Sam, *  Reason for Consultation: Establishing goals of care  HPI/Patient Profile: 87 y.o. female  admitted on 07/03/2022 with   dementia, HTN, HLD, hypothyroidism and recently admitted for fall/SDH and hyponatremia requiring 3% saline to [facility] about 3 weeks ago, discharged to SNF who returned for decreased mentation and belly distension. Admitted, Neurology consulted, placed on LTM EEG, found to have seizures due to her SDH.   Family report continued slow physical, functional and significant cognitive decline over the past many years  Family face treatment option decisions, advanced directive decisions and anticipatory care needs.    Patient does not have medical decision-making capacity.  Clinical Assessment and Goals of Care:  This NP Lorinda Creed reviewed medical records, received report from team, assessed the patient and then meet at the patient's bedside along with her husband, and son Freida Busman along with/daughter/Libby and son/David by telephone,  the to discuss diagnosis, prognosis, GOC, EOL wishes disposition and options.   Concept of Palliative Care was introduced as specialized medical care for people and their families living with serious illness.  If focuses on providing relief from the symptoms and stress of a serious illness.  Th Values and goals of care important to patient and family were attempted to be elicited.e goal is to improve quality of life for both the patient and the family.    Created space and opportunity for family to explore thoughts and feelings regarding current medical situation.  All family members today verbalized an understanding of the patient's continued slow decline over the past many months.   Family has actually been looking into assisted living opportunities for both patient and her husband.  Education offered on concept of adult failure to thrive, we discussed human mortality  They were hopeful for living  opportunity at [facility].       A  discussion was had today regarding advanced directives.  Concepts specific to code status, artifical feeding and hydration, continued IV antibiotics and rehospitalization was had.     The difference between a aggressive medical intervention path  and a palliative comfort care path for this patient at this time was had.   Education  offered on hospice benefit; philosophy and eligibility  All family members present are comfortable and agree that comfort and dignity are the main focus of care for Laura Carpenter at this time.    MOST form completed to reflect full comfort   Natural trajectory and expectations at EOL were discussed.  Questions and concerns addressed.  Patient  encouraged to call with questions or concerns.     PMT will continue to support holistically.           No ACP documents in [place]  NEXT OF KIN/husband       Plan of Care: -DNR/DNI -No artificial feeding now or in the  future -Comfort quality and dignity are the focus of care--allowing for natural death -Leave Foley in on discharge for comfort at end-of-life -Avoid rehospitalization -Symptom management through end-of-life trajectory -Transition home with hospice services in place.  Family is setting up 24/7 in-home care.  They will be ready for patient to transition home on Thursday   Code Status/Advance Care Planning: DNR   Symptom Management:  Family will discuss utilization of medications with hospice as the need arises once they are home  Palliative Prophylaxis:  Aspiration, Bowel Regimen, Delirium Protocol, Frequent Pain Assessment, and Oral Care  Additional Recommendations (Limitations, Scope, Preferences): Full Comfort  Care  Psycho-social/Spiritual:  Desire for further Chaplaincy support:no Additional Recommendations: Education on Hospice  Prognosis:  Unable to determine  Discharge Planning: To Be Determined      Primary Diagnoses: Present on Admission:  Subdural hematoma (HCC)  Acute metabolic encephalopathy  Dementia (HCC)  Hyponatremia  Mixed hyperlipidemia  Hypothyroidism, acquired  Essential hypertension  Obstructive sleep apnea   I have reviewed the medical record, interviewed the patient and family, and examined the patient. The following aspects are pertinent.  Past Medical History:  Diagnosis Date   Actinic keratosis 05/08/2019   Breast cancer (HCC)    02/17/10: right lumpectomy reveals 0.8 cm of invasive ductal carcinoma with 1/3 LN+ for isolated tumor cell cluster in Oncotype recurrent score 19 or 12% chance of distant recurrence; Femara self-discontinued secondary to side effects. She continues to decline further therapy   Chronic hyponatremia    Hyponatremia for which she has had since somewhere in the mid 1990's. She's had multiple hospitalization, one hospitalization in 1993, she was diagnosed with an EF of 25% as well, though coronaries were clean during that hospitalization. The cath showed an EF of 50%. She is being worked up for her hyponatremia at [facility].   Chronic hyponatremia    Hyponatremia for which she has had since somewhere in the mid 1990's. She's had multiple hospitalization, one hospitalization in 1993, she was diagnosed with an EF of 25% as well, though coronaries were clean during that hospitalization. The cath showed an EF of 50%. She is being worked up for her hyponatremia at [facility].   Dehydration 06/29/2015   Dementia (HCC)    Dizziness    Dizziness 12/26/2011   Excessive cerumen in both ear canals 06/09/2020   Fall at home 09/24/2015   Fibromuscular dysplasia of renal artery [facility])    1997 Renal angioplasty at [facility]. She subsequently underwent repeat  angioplasty a few years later. 12/2009 Abdominal Duplex: <60% bilateral RAS, Sequential narrowing and dilations of renal arteries, suggests fibromuscular dysplasia.    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   Skin lesion of left leg 05/29/2012   Sleep apnea    Uterine fibroid 06/28/2018   UTI (urinary tract infection)    Vertigo 12/26/2011  Social History   Socioeconomic History   Marital status: Married    Spouse name: Not on file   Number of children: Not on file   Years of education: Not on file   Highest education level: Not on file  Occupational History   Not on file  Tobacco Use   Smoking status: Former    Packs/day: 0.25    Years: 20.00    Additional pack years: 0.00    Total pack years: 5.00    Types: Cigarettes   Smokeless tobacco: Former    Quit date: 05/30/1976   Tobacco comments:    early teens; early college smoking Quit: 05/30/1976  Substance and Sexual Activity   Alcohol use: Yes    Alcohol/week: 0.0 standard drinks of alcohol    Comment: occasional social drinker   Drug use: No   Sexual activity: Yes    Partners: Male  Other Topics Concern   Not on file  Social History Narrative   Married x 59 years as of 09/2018    3 kids 1 daughter and 2 sons 5 grands    Homemaker    DPR   1. Husband John [phone]; [phone]   2. Libby [phone]) [phone]    From [place]   Social Determinants of Health   Financial Resource  Strain: Not on file  Food Insecurity: No Food Insecurity (06/22/2022)   Hunger Vital Sign    Worried About Running Out of Food in the Last Year: Never true    Ran Out of Food in the Last Year: Never true  Transportation Needs: No Transportation Needs (06/22/2022)   PRAPARE - Administrator, Civil Service (Medical): No    Lack of Transportation (Non-Medical): No  Physical Activity: Not on file  Stress: Not on file  Social Connections: Not on file   Family History  Problem Relation Age of Onset   Hypertension Mother    Cancer Mother        ? type lived 72    Alcohol abuse Father        died 53    Cirrhosis Father    Hypertension Daughter        Almyra Free   Cirrhosis Brother        a lot of health problems died 60s-70s    Hyperlipidemia Son    Scheduled Meds:  [START ON 07/13/2022] amiodarone  200 mg Oral Daily   atorvastatin  10 mg Oral q1800   Chlorhexidine Gluconate Cloth  6 each Topical Daily   feeding supplement  237 mL Oral BID BM   levETIRAcetam  500 mg Oral BID   levothyroxine  125 mcg Oral QAC breakfast   mirtazapine  7.5 mg Oral QHS   rivastigmine  9.5 mg Transdermal Daily   sodium chloride  1 g Oral BID WC   tamsulosin  0.4 mg Oral Daily   Continuous Infusions:  cefTRIAXone (ROCEPHIN)  IV Stopped (07/12/22 0910)   PRN Meds:.acetaminophen **OR** acetaminophen, labetalol, ondansetron **OR** ondansetron (ZOFRAN) IV Medications Prior to Admission:  Prior to Admission medications   Medication Sig Start Date End Date Taking? Authorizing Provider  amLODipine (NORVASC) 2.5 MG tablet Take 2.5 mg by mouth daily.   Yes [provider]  aspirin 81 MG tablet Take 1 tablet (81 mg total) by mouth daily. Hold aspirin x 1 week as per neuro surg 06/23/22  Yes Charise Killian, MD  atorvastatin (LIPITOR) 10 MG tablet TAKE ONE TABLET BY  MOUTH EVERY DAY AT 6PM Patient taking differently: Take 10 mg by mouth daily. TAKE ONE TABLET BY MOUTH EVERY DAY AT 6PM 01/05/22   Yes Worthy Rancher B, FNP  benazepril (LOTENSIN) 10 MG tablet Take 10 mg by mouth daily.   Yes [provider]  Cholecalciferol (VITAMIN D3) 50 MCG (2000 UT) TABS Take 4,000 Units by mouth daily.   Yes [provider]  levothyroxine (SYNTHROID) 125 MCG tablet Take 1 tablet (125 mcg total) by mouth daily before breakfast. 02/17/22  Yes Dana Allan, MD  mirtazapine (REMERON) 7.5 MG tablet Take 7.5 mg by mouth at bedtime. Take 1 tablet daily 12/27/21  Yes [provider]  rivastigmine (EXELON) 9.5 mg/24hr Place 9.5 mg onto the skin daily. 04/24/18  Yes [provider]  TYLENOL 325 MG tablet Take 650 mg by mouth every 6 (six) hours as needed for mild pain or moderate pain. 06/27/22  Yes [provider]  amlodipine-benazepril (LOTREL) 2.5-10 MG capsule TAKE 1 CAPSULE BY MOUTH ONCE DAILY Patient not taking: Reported on 07/04/2022 05/10/22   Dana Allan, MD   Allergies  Allergen Reactions   Aricept Palma Holter Hcl]     GI upset    Review of Systems  Unable to perform ROS: Dementia    Physical Exam Constitutional:      Appearance: She is cachectic. She is ill-appearing.  Cardiovascular:     Rate and Rhythm: Normal rate.  Pulmonary:     Effort: Pulmonary effort is normal.  Neurological:     Mental Status: She is lethargic.     Vital Signs: BP 126/80 (BP Location: Left Arm)   Pulse 73   Temp 98 F (36.7 C) (Oral)   Resp 18   SpO2 97%  Pain Scale: PAINAD POSS *See Group Information*: 1-Acceptable,Awake and alert Pain Score: 0-No pain   SpO2: SpO2: 97 % O2 Device:SpO2: 97 % O2 Flow Rate: .O2 Flow Rate (L/min): 2 L/min  IO: Intake/output summary:  Intake/Output Summary (Last 24 hours) at 07/12/2022 1051 Last data filed at 07/12/2022 0900 Gross per 24 hour  Intake 120 ml  Output 1700 ml  Net -1580 ml    LBM: Last BM Date : 07/10/22 Baseline Weight:   Most recent weight:       Palliative Assessment/Data:  30 % at best    Time: 90  minutes  Signed by: Lorinda Creed, NP   Please contact Palliative Medicine Team phone at [phone] for questions and concerns.  For individual provider: See Loretha Stapler

## 2022-07-12 NOTE — TOC Initial Note (Signed)
Transition of Care [facility]) - Initial/Assessment Note    Patient Details  Name: Laura Carpenter MRN: [id number] Date of Birth: 1936/01/10  Transition of Care [facility]) CM/SW Contact:    Kingsley Plan, RN Phone Number: 07/12/2022, 3:47 PM  Clinical Narrative:                  Spoke to patient's daughter Almyra Free via phone. Plan is to take her mom home Thursday by ambulance with hospice. Patient lives with husband. Confirmed address.   Patient has walker and 3 in 1 at home, no bed or oxygen.   Offered choice on hospice, Almyra Free prefers [facility] . Referral made to [facility] with [facility]. Family is working on arranging 24/7 assistance in the home. [facility] will review referral and call Almyra Free and order any needed DME. Libby aware  Expected Discharge Plan: Home w Hospice Care Barriers to Discharge: Continued Medical Work up   Patient Goals and CMS Choice Patient states their goals for this hospitalization and ongoing recovery are:: patient unable to participate in goal setting, not fully oriented CMS Medicare.gov Compare Post Acute Care list provided to:: Other (Comment Required) (daughter Almyra Free) Choice offered to / list presented to : Adult Children [place] ownership interest in [facility].provided to:: Adult Children    Expected Discharge Plan and Services   Discharge Planning Services: CM Consult Post Acute Care Choice: Hospice Living arrangements for the past 2 months: Single Family Home                 DME Arranged:  (hospice to arrange)           HH Agency:  [name]) Date HH Agency Contacted: 07/12/22 Time HH Agency Contacted: 1547 Representative spoke with at [facility] Agency: [name]  Prior Living Arrangements/Services Living arrangements for the past 2 months: Single Family Home Lives with:: Spouse Patient language and need for interpreter reviewed:: No Do you feel safe going back to the place where you live?: Yes      Need for Family Participation in  Patient Care: Yes (Comment) Care giver support system in place?: No (comment) Current home services: DME Criminal Activity/Legal Involvement Pertinent to Current Situation/Hospitalization: No - Comment as needed  Activities of Daily Living      Permission Sought/Granted Permission sought to share information with : Facility Medical sales representative, Family Supports Permission granted to share information with : Yes, Verbal Permission Granted  Share Information with NAME: Almyra Free  Permission granted to share info w AGENCY: SNF  Permission granted to share info w Relationship: Daughter     Emotional Assessment   Attitude/Demeanor/Rapport: Unable to Assess Affect (typically observed): Unable to Assess Orientation: : Oriented to Self Alcohol / Substance Use: Not Applicable Psych Involvement: No (comment)  Admission diagnosis:  Seizures (HCC) [R56.9] Patient Active Problem List   Diagnosis Date Noted   CAP (community acquired pneumonia) 07/10/2022   Hypokalemia 07/06/2022   Seizures (HCC) 07/03/2022   Paroxysmal atrial fibrillation with RVR 07/03/2022   Acute urinary retention 07/03/2022   Protein-calorie malnutrition, severe 06/20/2022   Subdural hematoma (HCC) 06/18/2022   Acute metabolic encephalopathy 06/18/2022   Physical deconditioning 01/03/2022   Breast nodule 11/11/2021   Weight loss 12/09/2020   Balance problem 10/28/2020   Hyponatremia 06/28/2018   Mixed hyperlipidemia 06/28/2018   History of breast cancer 06/28/2018   B12 deficiency 06/28/2018   Skin cancer    Hypothyroidism, acquired    Essential hypertension    Obstructive sleep apnea  Fibromuscular dysplasia of renal artery (HCC)    Dementia (HCC)    Secondary polycythemia 10/17/2014   Hearing loss 03/23/2012   PCP:  Dana Allan, MD Pharmacy:   [facility] [place], [place] - [address] [place] [address] Phone: [phone] Fax: [phone]  [facility]  [id number] [place], [place] - [address] AT [facility] [address] [place] [address] Phone: [phone] Fax: [phone]     Social Determinants of Health (SDOH) Social History: SDOH Screenings   Food Insecurity: No Food Insecurity (06/22/2022)  Housing: Low Risk  (06/22/2022)  Transportation Needs: No Transportation Needs (06/22/2022)  Utilities: Not At Risk (06/22/2022)  Depression (PHQ2-9): Low Risk  (02/22/2022)  Tobacco Use: Medium Risk (07/03/2022)   SDOH Interventions:     Readmission Risk Interventions     No data to display

## 2022-07-12 NOTE — TOC Progression Note (Addendum)
Transition of Care [facility]) - Progression Note    Patient Details  Name: Laura Carpenter MRN: [id number] Date of Birth: Mar 21, 1935  Transition of Care [facility]) CM/SW Contact  Baldemar Lenis, [place] Phone Number: 07/12/2022, 10:06 AM  Clinical Narrative:   CSW contacted by Shona Simpson to ask about patient status. CSW provided update on palliative consult pending for goals of care. [facility] continuing to follow for possible SNF placement pending goals of care.  UPDATE: CSW notified that family has chosen to pursue hospice care at home as opposed to SNF. CSW contacted [facility] to update.     Expected Discharge Plan: Skilled Nursing Facility Barriers to Discharge: Continued Medical Work up  Expected Discharge Plan and Services     Post Acute Care Choice: Skilled Nursing Facility Living arrangements for the past 2 months: Single Family Home                                       Social Determinants of Health (SDOH) Interventions SDOH Screenings   Food Insecurity: No Food Insecurity (06/22/2022)  Housing: Low Risk  (06/22/2022)  Transportation Needs: No Transportation Needs (06/22/2022)  Utilities: Not At Risk (06/22/2022)  Depression (PHQ2-9): Low Risk  (02/22/2022)  Tobacco Use: Medium Risk (07/03/2022)    Readmission Risk Interventions     No data to display

## 2022-07-12 NOTE — Progress Notes (Signed)
  Progress Note   Patient: Laura Carpenter:[id number] DOB: Jan 10, 1936 DOA: 07/03/2022     9 DOS: the patient was seen and examined on 07/12/2022 at 10:06AM      Brief hospital course: [name] is an 87 y.o. F with dementia, HTN, HLD, hypothyroidism and recently admitted for fall/SDH and hyponatremia requiring 3% saline to [facility] about 3 weeks ago, discharged to SNF who returned for decreased mentation and belly distension.  Admitted, Neurology consulted, placed on LTM EEG, found to have seizures due to her SDH.       Assessment and Plan: * Seizures (HCC) Paroxysmal atrial fibrillation with RVR Hyponatremia Acute metabolic encephalopathy Subdural hematoma (HCC) Hypokalemia Acute urinary retention Dementia (HCC) Obstructive sleep apnea Essential hypertension Hypothyroidism, acquired Mixed hyperlipidemia Severe protein calorie malnutrition  Admitted and Neurology consulted for seizures.  Stabilized on Keppra, LTM EEG showed no ongoing seizures.  With regard to her new Afib, she was started on amiodarone and the heart rate was controlled.  Obviously, anticoagulation was held.  With regard to her hyponatremia, she was started on salt tabs, and this improved to low 130s.  She was suspected to have possible aspiration pneumonia, and so was started on Rocephin, transitioned to Augmentin.  Despite the above, stabilization of seizures, Afib, hyponatremia and treamtent of infection, her mentation remained very poor, and her strength was too weak to work with PT  Palliative Care were consulted, and in discussion with family today, they have elected to transition to comfort care.   I support this, as her trajectory over the last few days has not been one consistent with an eventual recovery, and that at this time she is failing to thrive, and hospice is the best way to provide care at this phase.   - Continue Augmentin - Continue amiodarone - Continue levothyroxine - Continue  Keppra - Continue Rivastigmine - Continue salt tabs for now - Continue foley, stop Flomax - Consult Hospice, anticipate dicharge home on Thursday        Subjective: Patient is very weak and tired, no new fevers, no respiratory symptoms, mentation no improvement      Physical Exam: BP (!) 96/59 (BP Location: Left Arm)   Pulse 70   Temp 98.3 F (36.8 C) (Oral)   Resp 18   SpO2 95%   Frail elderly female, extremely weak and debilitated RRR, no murmurs, no pitting edema Respiratory effort extremely weak, I do not hear lung sounds, due to poor respiratory effort Abdomen without grimace to palpation Makes eye contact, but not really oriented to self, place, or situation Severe generalized weakness,   Data Reviewed: Patient metabolic panel shows sodium down to 129      Disposition: Status is: Inpatient The patient was admitted with seizures, hyponatremia  He has been stabilized, but as above, her clinical situation has not improved  In discussion with family today, was changed to comfort, and we will transition home with hospice on Thursday        Author: Alberteen Sam, MD 07/12/2022 5:37 PM  For on call review [email or web address].

## 2022-07-13 DIAGNOSIS — F02C0 Dementia in other diseases classified elsewhere, severe, without behavioral disturbance, psychotic disturbance, mood disturbance, and anxiety: Secondary | ICD-10-CM

## 2022-07-13 DIAGNOSIS — R569 Unspecified convulsions: Secondary | ICD-10-CM | POA: Diagnosis not present

## 2022-07-13 DIAGNOSIS — I480 Paroxysmal atrial fibrillation: Secondary | ICD-10-CM | POA: Diagnosis not present

## 2022-07-13 DIAGNOSIS — E871 Hypo-osmolality and hyponatremia: Secondary | ICD-10-CM | POA: Diagnosis not present

## 2022-07-13 DIAGNOSIS — G9341 Metabolic encephalopathy: Secondary | ICD-10-CM | POA: Diagnosis not present

## 2022-07-13 MED ORDER — LORAZEPAM 0.5 MG PO TABS
0.5000 mg | ORAL_TABLET | Freq: Four times a day (QID) | ORAL | Status: DC | PRN
  Administered 2022-07-13 (×2): 0.5 mg via ORAL
  Filled 2022-07-13 (×2): qty 1

## 2022-07-13 NOTE — Progress Notes (Addendum)
[facility]                                                                              Laura Carpenter, is a 87 y.o. female, DOB - 1935/12/11, ZOX:[id number] Admit date - 07/03/2022    Outpatient Primary MD for the patient is Dana Allan, MD  LOS - 10  days  No chief complaint on file.      Brief summary    Patient is a 87 y.o. F with dementia, HTN, HLD, hypothyroidism and recently admitted for fall/SDH and hyponatremia requiring 3% saline to [facility] about 3 weeks ago, discharged to SNF who returned for decreased mentation and abdominal distension.  Patient was admitted for further workup.  Neurology was consulted, placed on LTM EEG, found to have seizures due to her SDH.   Assessment & Plan    Principal Problem:   Seizures [facility]) -Neurology was consulted, stabilized on Keppra, LTM EEG showed no ongoing seizures. -Continue Keppra  Active Problems:   Paroxysmal atrial fibrillation with RVR, new -Patient was placed on amiodarone, no anticoagulation    Hyponatremia -Urine studies showed SIADH, was placed on salt tabs and mild fluid restriction -Sodium stable, continue salt tabs 1 g twice daily    Acute metabolic encephalopathy superimposed on dementia, SDH  -At baseline, ambulatory, conversational with mild short-term memory deficits -Continue rivastigmine - Despite stabilization of the seizures, atrial fibrillation, hyponatremia and treatment of infection, mentation remained poor and strength too weak to work with PT. -Palliative medicine was consulted, and discussion with family, patient was transitioned to comfort care  Possible aspiration pneumonia -Initially started on Rocephin, she was transitioned to Augmentin    Mixed hyperlipidemia -Patient was on Lipitor    Hypothyroidism, acquired -Recent TSH normal, on levothyroxine    Essential hypertension -Hold amlodipine, benazepril, continue IV labetalol as needed    Acute urinary retention  -Patient was  placed on Foley and Flomax  Severe protein calorie malnutrition Nutrition Problem: Severe Malnutrition Etiology: chronic illness Signs/Symptoms: severe fat depletion, severe muscle depletion Interventions: Ensure Enlive (each supplement provides 350kcal and 20 grams of protein), Refer to RD note for recommendations Estimated body mass index is 18.65 kg/m as calculated from the following:   Height as of 07/02/22: 5\' 7"  (1.702 m).   Weight as of 07/02/22: 54 kg.  Code Status: DNR DVT Prophylaxis:  SCDs Start: 07/03/22 1556   Level of Care: Level of care: Med-Surg Family Communication: d/w patient's daughter on phone  Disposition Plan:      Remains inpatient appropriate: Plan for home hospice tomorrow   Procedures:  EEG  Consultants:   Neurology Palliative medicine  Antimicrobials:   Anti-infectives (From admission, onward)    Start     Dose/Rate Route Frequency Ordered Stop   07/12/22 2200  amoxicillin-clavulanate (AUGMENTIN) 875-125 MG per tablet 1 tablet        1 tablet Oral Every 12 hours 07/12/22 1522     07/10/22 0945  cefTRIAXone (ROCEPHIN) 2 g in sodium chloride 0.9 % 100 mL IVPB  Status:  Discontinued        2 g 200 mL/hr over 30 Minutes Intravenous  Every 24 hours 07/10/22 0849 07/12/22 1522          Medications  amiodarone  200 mg Oral Daily   amoxicillin-clavulanate  1 tablet Oral Q12H   Chlorhexidine Gluconate Cloth  6 each Topical Daily   feeding supplement  237 mL Oral BID BM   levETIRAcetam  500 mg Oral BID   levothyroxine  125 mcg Oral QAC breakfast   mirtazapine  7.5 mg Oral QHS   rivastigmine  9.5 mg Transdermal Daily   sodium chloride  1 g Oral BID WC      Subjective:   Laura Carpenter was seen and examined today.  No acute issues overnight, no fevers or chills, difficult to obtain review of system from the patient due to mentation.  No reported nausea vomiting, abdominal pain, diarrhea.  Objective:   Vitals:   07/12/22 2052 07/12/22 2326  07/13/22 0451 07/13/22 0825  BP: 116/76 117/81 112/67 137/85  Pulse: 80 76 (!) 59 69  Resp: 18 17 18 18   Temp: 98.2 F (36.8 C) 98 F (36.7 C) 98.7 F (37.1 C) 97.7 F (36.5 C)  TempSrc: Oral Oral  Oral  SpO2: 96% 97% 97% 94%    Intake/Output Summary (Last 24 hours) at 07/13/2022 1032 Last data filed at 07/12/2022 1539 Gross per 24 hour  Intake 120 ml  Output 801 ml  Net -681 ml     Wt Readings from Last 3 Encounters:  07/02/22 54 kg  06/21/22 54.4 kg  02/25/22 49.8 kg     Exam General: Frail, ill-appearing Cardiovascular: S1 S2 auscultated,  RRR Respiratory: Clear to auscultation bilaterally, no wheezing Gastrointestinal: Soft, nontender, nondistended, + bowel sounds Ext: no pedal edema bilaterally Neuro: generalized weakness Psych: dementia    Data Reviewed:  I have personally reviewed following labs    CBC Lab Results  Component Value Date   WBC 8.4 07/12/2022   RBC 3.64 (L) 07/12/2022   HGB 11.5 (L) 07/12/2022   HCT 34.2 (L) 07/12/2022   MCV 94.0 07/12/2022   MCH 31.6 07/12/2022   PLT 343 07/12/2022   MCHC 33.6 07/12/2022   RDW 13.6 07/12/2022   LYMPHSABS 0.5 (L) 06/18/2022   MONOABS 0.6 06/18/2022   EOSABS 0.0 06/18/2022   BASOSABS 0.0 06/18/2022     Last metabolic panel Lab Results  Component Value Date   NA 129 (L) 07/12/2022   K 3.9 07/12/2022   CL 93 (L) 07/12/2022   CO2 27 07/12/2022   BUN 12 07/12/2022   CREATININE 0.85 07/12/2022   GLUCOSE 115 (H) 07/12/2022   GFRNONAA >60 07/12/2022   GFRAA 27 (L) 06/29/2015   CALCIUM 8.8 (L) 07/12/2022   PROT 5.5 (L) 07/07/2022   ALBUMIN 2.5 (L) 07/07/2022   BILITOT 1.0 07/07/2022   ALKPHOS 137 (H) 07/07/2022   AST 26 07/07/2022   ALT 26 07/07/2022   ANIONGAP 9 07/12/2022    CBG (last 3)  No results for input(s): "GLUCAP" in the last 72 hours.    Coagulation Profile: No results for input(s): "INR", "PROTIME" in the last 168 hours.   Radiology Studies: I have personally reviewed  the imaging studies  No results found.     Thad Ranger M.D. [facility] 07/13/2022, 10:32 AM  Available via Epic secure chat 7am-7pm After 7 pm, please refer to night coverage provider listed on amion.

## 2022-07-13 NOTE — Progress Notes (Signed)
[employer], [employer] [facility]) Hospital Liaison Note  Referral received for patient/family interest in hospice at home. [facility] liaison spoke with patient's daughter Almyra Free to confirm interest. Interest confirmed.   Plan is to discharge tomorrow via PTAR.   DME needs are hospital bed which [facility] has ordered and requested delivery for today after 3pm.   Please send comfort medications/prescriptions with patient at discharge.   Please call with any questions or concerns. Thank you  Dionicio Stall, [facility] Liaison [phone]

## 2022-07-13 NOTE — TOC Progression Note (Signed)
Transition of Care [facility]) - Progression Note    Patient Details  Name: Laura Carpenter MRN: [id number] Date of Birth: 04/11/1935  Transition of Care [facility]) CM/SW Contact  Harriet Masson, RN Phone Number: 07/13/2022, 2:53 PM  Clinical Narrative:      Plan for patient to discharge home with hospice tomorrow. [facility] asking for discharge around 12. Medically necessity complete. PTAR scheduled for tomorrow at 1230. Expected Discharge Plan: Home w Hospice Care Barriers to Discharge: Continued Medical Work up  Expected Discharge Plan and Services   Discharge Planning Services: CM Consult Post Acute Care Choice: Hospice Living arrangements for the past 2 months: Single Family Home                 DME Arranged:  (hospice to arrange)           HH Agency:  [name]) Date HH Agency Contacted: 07/12/22 Time HH Agency Contacted: 1547 Representative spoke with at [facility] Agency: [place]   Social Determinants of Health (SDOH) Interventions SDOH Screenings   Food Insecurity: No Food Insecurity (06/22/2022)  Housing: Low Risk  (06/22/2022)  Transportation Needs: No Transportation Needs (06/22/2022)  Utilities: Not At Risk (06/22/2022)  Depression (PHQ2-9): Low Risk  (02/22/2022)  Tobacco Use: Medium Risk (07/03/2022)    Readmission Risk Interventions     No data to display

## 2022-07-13 NOTE — Progress Notes (Signed)
Physical Therapy Treatment Patient Details Name: Laura Carpenter MRN: [id number] DOB: [birth year] Today's Date: 07/13/2022   History of Present Illness Pt is an 87 y.o. female who presented 07/03/2022 secondary to AMS and abdominal distension. CT head negative for acute changes. EEG 6/10: cortical dysfunction arising from left temporal region likely secondary to underlying subdural hematoma. No seizures or epileptiform discharges. CXR 6/8: COPD. There is prominence of central pulmonary vessels and interstitial markings in mid and lower lung fields suggesting possible CHF. There is no focal pulmonary consolidation. PMH: dementia, HTN, HLD, hypothyroidism. Pt recently admitted to [facility] for fall/SDH and hyponatremia ~3 weeks prior to this admission.    PT Comments    Pt not making meaningful progress towards physical therapy goals; received medication for anxious behaviors prior to session and subsequently lethargic. Could not promote alertness with bed in chair position, or with attempts at verbal/tactile stimulation and cool washcloth. Performed PROM to all extremities. Per chart review, plans to d/c home with hospice.    Recommendations for follow up therapy are one component of a multi-disciplinary discharge planning process, led by the attending physician.  Recommendations may be updated based on patient status, additional functional criteria and insurance authorization.  Follow Up Recommendations  Can patient physically be transported by private vehicle: No    Assistance Recommended at Discharge Frequent or constant Supervision/Assistance  Patient can return home with the following Assist for transportation;Help with stairs or ramp for entrance;Assistance with cooking/housework;Direct supervision/assist for medications management;Two people to help with walking and/or transfers;Two people to help with bathing/dressing/bathroom   Equipment Recommendations  Other (comment) (defer)     Recommendations for Other Services       Precautions / Restrictions Precautions Precautions: Fall Restrictions Weight Bearing Restrictions: No     Mobility  Bed Mobility Overal bed mobility: Needs Assistance             General bed mobility comments: TotalA for repositioning and sitting with trunk unsupported    Transfers                   General transfer comment: deferred due to level of alertness    Ambulation/Gait                   Stairs             Wheelchair Mobility    Modified Rankin (Stroke Patients Only)       Balance                                            Cognition Arousal/Alertness: Lethargic Behavior During Therapy: Flat affect Overall Cognitive Status: History of cognitive impairments - at baseline                                 General Comments: Pt somnolent and not following commands        Exercises General Exercises - Upper Extremity Shoulder Flexion: PROM, Both, 10 reps, Supine Elbow Flexion: PROM, Both, 10 reps, Supine Elbow Extension: PROM, Both, 10 reps, Supine General Exercises - Lower Extremity Ankle Circles/Pumps: PROM, Both, 10 reps, Supine Heel Slides: PROM, Both, 5 reps, Supine Hip ABduction/ADduction: PROM, Both, 5 reps, Supine    General Comments        Pertinent Vitals/Pain Pain Assessment Pain Assessment:  PAINAD Breathing: normal Negative Vocalization: none Facial Expression: smiling or inexpressive Body Language: relaxed Consolability: no need to console PAINAD Score: 0 Pain Intervention(s): Monitored during session    Home Living                          Prior Function            PT Goals (current goals can now be found in the care plan section) Acute Rehab PT Goals Patient Stated Goal: not stated Potential to Achieve Goals: Fair Progress towards PT goals: Not progressing toward goals - comment    Frequency    Min  2X/week      PT Plan Discharge plan needs to be updated;Frequency needs to be updated    Co-evaluation              AM-PAC PT "6 Clicks" Mobility   Outcome Measure  Help needed turning from your back to your side while in a flat bed without using bedrails?: Total Help needed moving from lying on your back to sitting on the side of a flat bed without using bedrails?: Total Help needed moving to and from a bed to a chair (including a wheelchair)?: Total Help needed standing up from a chair using your arms (e.g., wheelchair or bedside chair)?: Total Help needed to walk in hospital room?: Total Help needed climbing 3-5 steps with a railing? : Total 6 Click Score: 6    End of Session   Activity Tolerance: Patient limited by lethargy Patient left: in bed;with call bell/phone within reach;with bed alarm set Nurse Communication: Mobility status PT Visit Diagnosis: Unsteadiness on feet (R26.81);Muscle weakness (generalized) (M62.81)     Time: 2956-2130 PT Time Calculation (min) (ACUTE ONLY): 14 min  Charges:  $Therapeutic Exercise: 8-22 mins                     Lillia Pauls, PT, DPT Acute Rehabilitation Services Office [phone]    Norval Morton 07/13/2022, 2:56 PM

## 2022-07-13 NOTE — Progress Notes (Signed)
Patient's son is concerned about patient's right leg twitching. It randomly twitches for a few seconds then stops.

## 2022-07-13 NOTE — Progress Notes (Signed)
Patient ID: PEACE YAGER, female   DOB: 20-Apr-1935, 87 y.o.   MRN: [id number]    Progress Note from the Palliative Medicine Team at [facility]   Patient Name: Laura Carpenter        Date: 07/13/2022 DOB: 01-05-1936  Age: 87 y.o. MRN#: [id number] Attending Physician: Cathren Harsh, MD Primary Care Physician: Dana Allan, MD Admit Date: 07/03/2022    Extensive chart review has been completed prior to meeting with patient/family  including labs, vital signs, imaging, progress/consult notes, orders, medications and available advance directive documents.   87 y.o. female  admitted on 07/03/2022 with   dementia, HTN, HLD, hypothyroidism and recently admitted for fall/SDH and hyponatremia requiring 3% saline to [facility] about 3 weeks ago, discharged to SNF who returned for decreased mentation and belly distension. Admitted, Neurology consulted, placed on LTM EEG, found to have seizures due to her SDH.          This NP assessed patient at the bedside as a follow up to  yesterday's GOCs meeting.  Patient's husband at bedside          Patient is alert, currently she is agitated and asking repeatedly to get out of bed.  She is fidgety and not easily calmed with words and  presence            Recommendation for Ativan 0.5 mg p.o./sublingual every 6 hours as needed for agitation   Family report continued slow physical, functional and significant cognitive decline over the past many years  Yesterday family as a whole made decision to shift focus of care to comfort, quality and dignity allowing for natural death.  Hospice has been in contact with family,  plan is for discharge on Thursday.    Again education offered on hospice services in the home.  Family is working to put together a home care plan.  Questions and concerns addressed  Education offered today regarding  the importance of continued conversation with family and their  medical providers regarding overall plan of care and treatment options,   ensuring decisions are within the context of the patients values and GOCs.  Education offered on the natural trajectory and expectations at end-of-life.  Time: 50 minutes  Detailed review of medical records ( labs, imaging, vital signs), medically appropriate exam ( MS, skin, resp)   discussed with treatment team, counseling and education to patient, family, staff, documenting clinical information, medication management, coordination of care    Lorinda Creed NP  Palliative Medicine Team Team Phone # [phone] Pager [phone]

## 2022-07-14 ENCOUNTER — Inpatient Hospital Stay (HOSPITAL_COMMUNITY): Payer: Medicare Other

## 2022-07-14 DIAGNOSIS — E871 Hypo-osmolality and hyponatremia: Secondary | ICD-10-CM | POA: Diagnosis not present

## 2022-07-14 DIAGNOSIS — G40001 Localization-related (focal) (partial) idiopathic epilepsy and epileptic syndromes with seizures of localized onset, not intractable, with status epilepticus: Secondary | ICD-10-CM | POA: Diagnosis not present

## 2022-07-14 DIAGNOSIS — S065XAA Traumatic subdural hemorrhage with loss of consciousness status unknown, initial encounter: Secondary | ICD-10-CM

## 2022-07-14 DIAGNOSIS — I480 Paroxysmal atrial fibrillation: Secondary | ICD-10-CM | POA: Diagnosis not present

## 2022-07-14 DIAGNOSIS — R569 Unspecified convulsions: Secondary | ICD-10-CM | POA: Diagnosis not present

## 2022-07-14 LAB — COMPREHENSIVE METABOLIC PANEL
ALT: 25 U/L (ref 0–44)
AST: 31 U/L (ref 15–41)
Albumin: 2.6 g/dL — ABNORMAL LOW (ref 3.5–5.0)
Alkaline Phosphatase: 128 U/L — ABNORMAL HIGH (ref 38–126)
Anion gap: 8 (ref 5–15)
BUN: 10 mg/dL (ref 8–23)
CO2: 27 mmol/L (ref 22–32)
Calcium: 9 mg/dL (ref 8.9–10.3)
Chloride: 97 mmol/L — ABNORMAL LOW (ref 98–111)
Creatinine, Ser: 0.74 mg/dL (ref 0.44–1.00)
GFR, Estimated: >60 mL/min (ref >60)
Glucose, Bld: 91 mg/dL (ref 70–99)
Potassium: 4.4 mmol/L (ref 3.5–5.1)
Sodium: 132 mmol/L — ABNORMAL LOW (ref 135–145)
Total Bilirubin: 0.5 mg/dL (ref 0.3–1.2)
Total Protein: 5.8 g/dL — ABNORMAL LOW (ref 6.5–8.1)

## 2022-07-14 LAB — URINALYSIS, ROUTINE W REFLEX MICROSCOPIC
Bilirubin Urine: NEGATIVE
Glucose, UA: NEGATIVE mg/dL
Hgb urine dipstick: NEGATIVE
Ketones, ur: 5 mg/dL — AB
Leukocytes,Ua: NEGATIVE
Nitrite: NEGATIVE
Protein, ur: NEGATIVE mg/dL
Specific Gravity, Urine: 1.011 (ref 1.005–1.030)
pH: 7 (ref 5.0–8.0)

## 2022-07-14 LAB — CBC WITH DIFFERENTIAL/PLATELET
Abs Immature Granulocytes: 0.04 10*3/uL (ref 0.00–0.07)
Basophils Absolute: 0.1 10*3/uL (ref 0.0–0.1)
Basophils Relative: 1 %
Eosinophils Absolute: 0.2 10*3/uL (ref 0.0–0.5)
Eosinophils Relative: 4 %
HCT: 37.3 % (ref 36.0–46.0)
Hemoglobin: 12.6 g/dL (ref 12.0–15.0)
Immature Granulocytes: 1 %
Lymphocytes Relative: 22 %
Lymphs Abs: 1.2 10*3/uL (ref 0.7–4.0)
MCH: 32.2 pg (ref 26.0–34.0)
MCHC: 33.8 g/dL (ref 30.0–36.0)
MCV: 95.4 fL (ref 80.0–100.0)
Monocytes Absolute: 0.5 10*3/uL (ref 0.1–1.0)
Monocytes Relative: 9 %
Neutro Abs: 3.5 10*3/uL (ref 1.7–7.7)
Neutrophils Relative %: 63 %
Platelets: 372 10*3/uL (ref 150–400)
RBC: 3.91 MIL/uL (ref 3.87–5.11)
RDW: 13.6 % (ref 11.5–15.5)
WBC: 5.4 10*3/uL (ref 4.0–10.5)
nRBC: 0 % (ref 0.0–0.2)

## 2022-07-14 LAB — PHOSPHORUS: Phosphorus: 3.2 mg/dL (ref 2.5–4.6)

## 2022-07-14 LAB — MAGNESIUM: Magnesium: 2.1 mg/dL (ref 1.7–2.4)

## 2022-07-14 MED ORDER — AMIODARONE HCL 200 MG PO TABS: 200.0000 mg | ORAL_TABLET | Freq: Every day | ORAL | 3 refills | Status: DC

## 2022-07-14 MED ORDER — CLONAZEPAM 0.125 MG PO TBDP
0.1250 mg | ORAL_TABLET | Freq: Two times a day (BID) | ORAL | Status: DC
  Administered 2022-07-14: 0.125 mg via ORAL
  Filled 2022-07-14: qty 1

## 2022-07-14 MED ORDER — AMOXICILLIN-POT CLAVULANATE 875-125 MG PO TABS: 1.0000 | ORAL_TABLET | Freq: Two times a day (BID) | ORAL | 0 refills | Status: AC

## 2022-07-14 MED ORDER — CLONAZEPAM 0.125 MG PO TBDP: 0.1250 mg | ORAL_TABLET | Freq: Two times a day (BID) | ORAL | 0 refills | Status: DC

## 2022-07-14 MED ORDER — LORAZEPAM 2 MG/ML PO CONC: 2.0000 mg | Freq: Two times a day (BID) | ORAL | 0 refills | Status: DC | PRN

## 2022-07-14 MED ORDER — CLONAZEPAM 0.125 MG PO TBDP: 0.2500 mg | ORAL_TABLET | Freq: Two times a day (BID) | ORAL | Status: DC

## 2022-07-14 MED ORDER — SODIUM CHLORIDE 1 G PO TABS: 1.0000 g | ORAL_TABLET | Freq: Two times a day (BID) | ORAL | 3 refills | Status: DC

## 2022-07-14 MED ORDER — LORAZEPAM 2 MG/ML PO CONC: 2.0000 mg | Freq: Two times a day (BID) | ORAL | Status: DC | PRN

## 2022-07-14 MED ORDER — ONDANSETRON HCL 4 MG PO TABS: 4.0000 mg | ORAL_TABLET | Freq: Three times a day (TID) | ORAL | 0 refills | Status: DC | PRN

## 2022-07-14 MED ORDER — LEVETIRACETAM 500 MG PO TABS: 500.0000 mg | ORAL_TABLET | Freq: Two times a day (BID) | ORAL | 3 refills | Status: DC

## 2022-07-14 NOTE — TOC Progression Note (Signed)
Transition of Care (TOC) - Progression Note   Spoke to her daughter Laura Carpenter, she wants transport arranged for 3 pm not 1230pm , she has caregivers coming at 4 pm.   She understands it may be later . She would like nurse  to call her when PTAR arrives to the hospital room   NCM secure chatted nurse and called [name] at [facility]  Patient Details  Name: Laura Carpenter MRN: [id number] Date of Birth: Dec 26, 1935  Transition of Care [facility]) CM/SW Contact  [name], [name], RN Phone Number: 07/14/2022, 10:10 AM  Clinical Narrative:       Expected Discharge Plan: Home w Hospice Care Barriers to Discharge: Continued Medical Work up  Expected Discharge Plan and Services   Discharge Planning Services: CM Consult Post Acute Care Choice: Hospice Living arrangements for the past 2 months: Single Family Home                 DME Arranged:  (hospice to arrange)           HH Agency:  [name]) Date HH Agency Contacted: 07/12/22 Time HH Agency Contacted: 1547 Representative spoke with at [facility] Agency: [name]   Social Determinants of Health (SDOH) Interventions SDOH Screenings   Food Insecurity: No Food Insecurity (06/22/2022)  Housing: Low Risk  (06/22/2022)  Transportation Needs: No Transportation Needs (06/22/2022)  Utilities: Not At Risk (06/22/2022)  Depression (PHQ2-9): Low Risk  (02/22/2022)  Tobacco Use: Medium Risk (07/03/2022)    Readmission Risk Interventions     No data to display

## 2022-07-14 NOTE — Discharge Summary (Signed)
Physician Discharge Summary   Patient: Laura Carpenter MRN: [id number] DOB: 15-Oct-1935  Admit date:     07/03/2022  Discharge date: 07/14/22  Discharge Physician: Thad Ranger, MD    PCP: Dana Allan, MD   Recommendations at discharge:   Continue Keppra 500 mg twice daily Clonazepam 0.125 mg twice daily, this may be gradually increased by the hospice team for seizure control and sedation allows Ativan liquid 2 mg as needed for generalized seizure for rescue medication  Discharge Diagnoses:    Seizures (HCC)   Paroxysmal atrial fibrillation with RVR   Hyponatremia   Acute metabolic encephalopathy   Subdural hematoma (HCC)   Mixed hyperlipidemia   Hypothyroidism, acquired   Essential hypertension   Obstructive sleep apnea   Dementia (HCC)   Acute urinary retention   Hypokalemia   CAP (community acquired pneumonia)    Hospital Course:  Patient is a 87 y.o. F with dementia, HTN, HLD, hypothyroidism and recently admitted for fall/SDH and hyponatremia requiring 3% saline to [facility] about 3 weeks ago, discharged to SNF who returned for decreased mentation and abdominal distension.  Patient was admitted for further workup.  Neurology was consulted, placed on LTM EEG, found to have seizures due to her SDH.    Assessment and Plan: Seizures (HCC) -Patient was 87 y.o. F with dementia, HTN, HLD, hypothyroidism and recently admitted for fall/SDH and hyponatremia requiring 3% saline to [facility] about 3 weeks ago, discharged to SNF who returned for decreased mentation and abdominal distension. and right-sided twitching.   -Neurology was consulted, stabilized on Keppra, LTM EEG showed no ongoing seizures. -Continue Keppra 500 mg twice daily. -Neurology was reconsulted for recurrent right lower extremity twitching.  -Electrolytes within normal limits, UA showed no UTI, chest x-ray showed no new infiltrates   Patient was evaluated by neurology and recommended adding clonazepam 0.125 mg ODT twice daily and gradually increase by hospice team for seizure control as sedation allows.  Also recommended Ativan liquid 2 mg as needed for generalized seizure for rescue medication.      Paroxysmal  atrial fibrillation with RVR, new -Patient was placed on amiodarone, no anticoagulation     Hyponatremia -Urine studies showed SIADH, was placed on salt tabs and mild fluid restriction -Sodium stable, continue salt tabs 1 g twice daily     Acute metabolic encephalopathy superimposed on dementia, SDH  -At baseline, ambulatory, conversational with mild short-term memory deficits -Continue rivastigmine - Despite stabilization of the seizures, atrial fibrillation, hyponatremia and treatment of infection, mentation remained poor and strength too weak to work with PT. -Palliative medicine was consulted, and discussion with family, patient was transitioned to comfort care.  Patient will discharge home with hospice.   Possible aspiration pneumonia -Initially started on Rocephin, she was transitioned to Augmentin     Mixed hyperlipidemia -Patient was on Lipitor     Hypothyroidism, acquired -Recent TSH normal, on levothyroxine     Essential hypertension -Hold amlodipine, benazepril     Acute urinary retention  -Patient was placed on Foley, received Flomax for 7 days   Severe protein calorie malnutrition Nutrition Problem: Severe Malnutrition Etiology: chronic illness Signs/Symptoms: severe fat depletion, severe muscle depletion Interventions: Ensure Enlive (each supplement provides 350kcal and 20 grams of protein), Refer to RD note for recommendations Estimated body mass index is 18.65 kg/m as calculated from the following:   Height as of 07/02/22: 5\' 7"  (1.702 m).   Weight as of 07/02/22: 54 kg.      Pain control - [employer] Controlled Substance Reporting System database was reviewed. and patient was instructed, not to drive, operate heavy machinery, perform activities at heights, swimming or participation in water activities  or provide baby-sitting services while on Pain, Sleep and Anxiety Medications; until their outpatient Physician has advised to do so again. Also recommended to  not to take more than prescribed Pain, Sleep and Anxiety Medications.  Consultants: Neurology, palliative medicine Procedures performed: EEG Disposition: Hospice care Diet recommendation: Dysphagia 2 diet with thin liquids  DISCHARGE MEDICATION: Allergies as of 07/14/2022       Reactions   Aricept [donepezil Hcl]    GI upset         Medication List     STOP taking these medications    amLODipine 2.5 MG tablet Commonly known as: NORVASC   amlodipine-benazepril 2.5-10 MG capsule Commonly known as: LOTREL   aspirin 81 MG tablet   atorvastatin 10 MG tablet Commonly known as: LIPITOR   benazepril 10 MG tablet Commonly known as: LOTENSIN   Vitamin D3 50 MCG (2000 UT) Tabs       TAKE these medications    amiodarone 200 MG tablet Commonly known as: PACERONE Take 1 tablet (200 mg total) by mouth daily. Start taking on: July 15, 2022   amoxicillin-clavulanate 875-125 MG tablet Commonly known as: AUGMENTIN Take 1 tablet by mouth 2 (two) times daily for 4 days.   clonazepam 0.125 MG disintegrating tablet Commonly known as: KLONOPIN Take 1 tablet (0.125 mg total) by mouth 2 (two) times daily.   levETIRAcetam 500 MG tablet Commonly known as: KEPPRA Take 1 tablet (500 mg total) by mouth 2 (two) times daily.   levothyroxine 125 MCG tablet Commonly known as: SYNTHROID Take 1 tablet (125 mcg total) by mouth daily before breakfast.   LORazepam 2 MG/ML concentrated solution Commonly known as: ATIVAN Take 1 mL (2 mg total) by mouth 2 (two) times daily as needed for seizure (Generalized seizure > 5 min or worsening seizure activity / discomfort).   mirtazapine 7.5 MG tablet Commonly known as: REMERON Take 7.5 mg by mouth at bedtime. Take 1 tablet daily   ondansetron 4 MG tablet Commonly known as: ZOFRAN Take 1 tablet (4 mg total) by mouth every 8 (eight) hours as needed for nausea or vomiting.   rivastigmine 9.5 mg/24hr Commonly known as: EXELON Place 9.5 mg onto  the skin daily.   sodium chloride 1 g tablet Take 1 tablet (1 g total) by mouth 2 (two) times daily with a meal.   Tylenol 325 MG tablet Generic drug: acetaminophen Take 650 mg by mouth every 6 (six) hours as needed for mild pain or moderate pain.        Follow-up Information     Dana Allan, MD. Schedule an appointment as soon as possible for a visit in 2 week(s).   Specialty: Family Medicine Why: for hospital follow-up Contact information: [address] [place] [address] [phone]                Discharge Exam: S: Sleepy but arousable, no acute issues, continues to have right lower extremity twitching  BP 119/77 (BP Location: Left Arm)   Pulse 60   Temp 98.7 F (37.1 C) (Axillary)   Resp 14   SpO2 96%   Physical Exam General: Sleepy but  arousable, does not follow commands Cardiovascular: S1 S2 clear, RRR.  Respiratory: CTAB, no wheezing Gastrointestinal: Soft, nontender, nondistended, NBS Ext: no pedal edema bilaterally Neuro: right lower extremity twitching noted, right side weaker than left. Psych: sleepy   Condition at discharge: poor  The results of significant diagnostics from this hospitalization (including imaging, microbiology, ancillary and laboratory)  are listed below for reference.   Imaging Studies: DG CHEST PORT 1 VIEW  Result Date: 07/14/2022 CLINICAL DATA:  Altered mental status. EXAM: PORTABLE CHEST 1 VIEW COMPARISON:  July 02, 2022. FINDINGS: Stable cardiomediastinal silhouette. Mild bibasilar subsegmental atelectasis or scarring is noted. Possible mildly displaced left seventh rib fracture of indeterminate age. IMPRESSION: Mild bibasilar subsegmental atelectasis or scarring. Electronically Signed   By: Lupita Raider M.D.   On: 07/14/2022 10:45   Overnight EEG with video  Result Date: 07/04/2022 Charlsie Quest, MD     07/05/2022  9:14 AM Patient Name: Laura Carpenter MRN: [id number] Epilepsy Attending: Charlsie Quest  Referring Physician/Provider: Erick Blinks, MD Duration: 07/03/2022 1724 to 07/04/2022 1724 Patient history: 87 y.o. F with dementia who presented with decreased mentation. EEG to evaluate for seizure. Level of alertness: Awake, asleep AEDs during EEG study: LEV Technical aspects: This EEG study was done with scalp electrodes positioned according to the 10-20 International system of electrode placement. Electrical activity was reviewed with band pass filter of 1-70Hz , sensitivity of 7 uV/mm, display speed of 56mm/sec with a 60Hz  notched filter applied as appropriate. EEG data were recorded continuously and digitally stored.  Video monitoring was available and reviewed as appropriate. Description: The posterior dominant rhythm consists of 8 Hz activity of moderate voltage (25-35 uV) seen predominantly in posterior head regions, symmetric and reactive to eye opening and eye closing. Sleep was characterized by vertex waves, sleep spindles (12 to 14 Hz), maximal frontocentral region. EEG showed intermittent 3 to 6 Hz theta-delta slowing in left temporal region. Hyperventilation and photic stimulation were not performed.   ABNORMALITY - Intermittent slow, left temporal region IMPRESSION: This study is suggestive of cortical dysfunction arising from left temporal region likely secondary to underlying subdural hematoma. No seizures or epileptiform discharges were seen throughout the recording. Charlsie Quest   DG Chest Portable 1 View  Result Date: 07/02/2022 CLINICAL DATA:  Shortness of breath EXAM: PORTABLE CHEST 1 VIEW COMPARISON:  06/18/2022 FINDINGS: Transverse diameter of heart is increased. Low position of diaphragms suggests COPD. Central pulmonary vessels are prominent. There is prominence of interstitial markings in the parahilar regions and lower lung fields. There is no focal consolidation. There is no significant pleural effusion or pneumothorax. IMPRESSION: COPD. There is prominence of central  pulmonary vessels and interstitial markings in mid and lower lung fields suggesting possible CHF. There is no focal pulmonary consolidation. Electronically Signed   By: Ernie Avena M.D.   On: 07/02/2022 15:27   CT ABDOMEN PELVIS W CONTRAST  Result Date: 07/02/2022 CLINICAL DATA:  Abdominal pain, acute, nonlocalized. EXAM: CT ABDOMEN AND PELVIS WITH CONTRAST TECHNIQUE: Multidetector CT imaging of the abdomen and pelvis was performed using the standard protocol following bolus administration of intravenous contrast. RADIATION DOSE REDUCTION: This exam was performed according to the departmental dose-optimization program which includes automated exposure control, adjustment of the mA and/or kV according to patient size and/or use of iterative reconstruction technique. CONTRAST:  80mL OMNIPAQUE IOHEXOL 300 MG/ML  SOLN COMPARISON:  CT abdomen/pelvis 04/13/2022. FINDINGS: Lower chest: Patchy consolidation in the left lower lobe, suspicious for aspiration or infection. Hepatobiliary: No focal liver abnormality is seen. No gallstones, gallbladder wall thickening, or biliary dilatation. Pancreas: Unremarkable. No pancreatic ductal dilatation or surrounding inflammatory changes. Spleen: Normal. Adrenals/Urinary Tract: Adrenal glands are unremarkable. Mild bilateral hydroureteronephrosis. Severe distention of the bladder, suspicious for bladder outlet obstruction versus neurogenic bladder. Stomach/Bowel: Stomach is within normal limits. Appendix appears normal. No  evidence of bowel wall thickening, distention, or inflammatory changes. Vascular/Lymphatic: Aortic atherosclerosis. No enlarged abdominal or pelvic lymph nodes. Reproductive: Uterus and bilateral adnexa are unremarkable. Other: No abdominal wall hernia or abnormality. No abdominopelvic ascites. Musculoskeletal: Mild lower lumbar spondylosis without high-grade spinal canal stenosis. No acute bone findings. Degenerative changes of the bilateral hips.  IMPRESSION: 1. Severe distention of the bladder, suspicious for bladder outlet obstruction versus neurogenic bladder. Mild bilateral hydroureteronephrosis. 2. Patchy consolidation in the left lung lower lobe, suspicious for aspiration or pneumonia. Aortic Atherosclerosis (ICD10-I70.0). Electronically Signed   By: Orvan Falconer M.D.   On: 07/02/2022 12:49   CT Head Wo Contrast  Result Date: 07/02/2022 CLINICAL DATA:  Altered mental status EXAM: CT HEAD WITHOUT CONTRAST TECHNIQUE: Contiguous axial images were obtained from the base of the skull through the vertex without intravenous contrast. RADIATION DOSE REDUCTION: This exam was performed according to the departmental dose-optimization program which includes automated exposure control, adjustment of the mA and/or kV according to patient size and/or use of iterative reconstruction technique. COMPARISON:  Two days ago FINDINGS: Brain: Isodense subdural hematoma along the left cerebral convexity without convincing change, to 5 mm in thickness. High-density components have mildly decreased. No new hemorrhage. No infarct, hydrocephalus, or collection. No worrisome mass effect. Vascular: No hyperdense vessel or unexpected calcification. Skull: Normal. Negative for fracture or focal lesion. Sinuses/Orbits: No acute finding. IMPRESSION: 1. No new or acute finding. 2. Primarily isodense subdural hematoma on the left cerebral convexity shows expected interval evolution. Electronically Signed   By: Tiburcio Pea M.D.   On: 07/02/2022 12:26   CT HEAD WO CONTRAST ( )  Result Date: 06/30/2022 CLINICAL DATA:  Subarachnoid hemorrhage [facility]) EXAM: CT HEAD WITHOUT CONTRAST TECHNIQUE: Contiguous axial images were obtained from the base of the skull through the vertex without intravenous contrast. RADIATION DOSE REDUCTION: This exam was performed according to the departmental dose-optimization program which includes automated exposure control, adjustment of the mA and/or kV  according to patient size and/or use of iterative reconstruction technique. COMPARISON:  CT Head 06/18/22 FINDINGS: Brain: Redemonstrated involving left cerebral convexity subdural hematoma, which appears less hyperdense than on prior exam. This measures a proximally 6 mm, not significantly changed from prior exam. There is no significant midline shift. No mass effect. Previously seen subarachnoid hemorrhage in the right sylvian fissure is no longer visualized. No hydrocephalus. No CT evidence of an acute infarct. Sequela of mild chronic microvascular ischemic change with generalized volume loss. Vascular: No hyperdense vessel or unexpected calcification. Skull: Normal. Negative for fracture or focal lesion. Sinuses/Orbits: No middle ear or mastoid effusion. Frothy secretions in the left maxillary sinus. Mucosal thickening bilateral ethmoid sinuses. Left lens replacement. Orbits are otherwise unremarkable. Other: None. IMPRESSION: 1. Evolving left cerebral convexity subdural hematoma, unchanged in size from prior exam. 2. Previously seen subarachnoid hemorrhage in the right Sylvian fissure is no longer visualized. Electronically Signed   By: Lorenza Cambridge M.D.   On: 06/30/2022 16:49   ECHOCARDIOGRAM COMPLETE  Result Date: 06/19/2022    ECHOCARDIOGRAM REPORT   Patient Name:   Laura Carpenter Date of Exam: 06/19/2022 Medical Rec #:  [id number]    Height:       67.0 in Accession #:    [id number]   Weight:       120.0 lb Date of Birth:  [birth year]    BSA:          1.628 m Patient Age:    43 years  BP:           112/71 mmHg Patient Gender: F            HR:           75 bpm. Exam Location:  [facility] Procedure: 2D Echo Indications:     Atrial Fibrillation I48.91  History:         Patient has no prior history of Echocardiogram examinations.  Sonographer:     Overton Mam RDCS, FASE Referring Phys:  [id number] BRENDA MORRISON Diagnosing Phys: Julien Nordmann MD  Sonographer Comments: Technically challenging study due to limited  acoustic windows and no apical window. IMPRESSIONS  1. Left ventricular ejection fraction, by estimation, is 60 to 65%. The left ventricle has normal function. The left ventricle has no regional wall motion abnormalities. Left ventricular diastolic parameters are indeterminate.  2. Right ventricular systolic function is normal. The right ventricular size is normal. There is normal pulmonary artery systolic pressure.  3. The mitral valve is normal in structure. Mild mitral valve regurgitation. No evidence of mitral stenosis.  4. The aortic valve is tricuspid. Aortic valve regurgitation is not visualized. No aortic stenosis is present.  5. The inferior vena cava is normal in size with greater than 50% respiratory variability, suggesting right atrial pressure of 3 mmHg. FINDINGS  Left Ventricle: Left ventricular ejection fraction, by estimation, is 60 to 65%. The left ventricle has normal function. The left ventricle has no regional wall motion abnormalities. The left ventricular internal cavity size was normal in size. There is  no left ventricular hypertrophy. Left ventricular diastolic parameters are indeterminate. Right Ventricle: The right ventricular size is normal. No increase in right ventricular wall thickness. Right ventricular systolic function is normal. There is normal pulmonary artery systolic pressure. The tricuspid regurgitant velocity is 2.61 m/s, and  with an assumed right atrial pressure of 5 mmHg, the estimated right ventricular systolic pressure is 32.2 mmHg. Left Atrium: Left atrial size was normal in size. Right Atrium: Right atrial size was normal in size. Pericardium: There is no evidence of pericardial effusion. Mitral Valve: The mitral valve is normal in structure. Mild mitral valve regurgitation. No evidence of mitral valve stenosis. Tricuspid Valve: The tricuspid valve is normal in structure. Tricuspid valve regurgitation is mild . No evidence of tricuspid stenosis. Aortic Valve: The aortic  valve is tricuspid. Aortic valve regurgitation is not visualized. No aortic stenosis is present. Pulmonic Valve: The pulmonic valve was normal in structure. Pulmonic valve regurgitation is mild. No evidence of pulmonic stenosis. Aorta: The aortic root is normal in size and structure. Venous: The inferior vena cava is normal in size with greater than 50% respiratory variability, suggesting right atrial pressure of 3 mmHg. IAS/Shunts: No atrial level shunt detected by color flow Doppler.  LEFT VENTRICLE PLAX 2D LVIDd:         4.00 cm LVIDs:         2.80 cm LV PW:         1.00 cm LV IVS:        0.80 cm LVOT diam:     1.60 cm LVOT Area:     2.01 cm  LEFT ATRIUM         Index LA diam:    2.80 cm 1.72 cm/m                        PULMONIC VALVE AORTA  PV Vmax:        0.93 m/s Ao Root diam: 3.00 cm PV Peak grad:   3.5 mmHg                       RVOT Peak grad: 3 mmHg  TRICUSPID VALVE TR Peak grad:   27.2 mmHg TR Vmax:        261.00 cm/s  SHUNTS Systemic Diam: 1.60 cm Julien Nordmann MD Electronically signed by Julien Nordmann MD Signature Date/Time: 06/19/2022/6:36:24 PM    Final    CT HEAD WO CONTRAST ( )  Result Date: 06/18/2022 CLINICAL DATA:  Subdural hematoma EXAM: CT HEAD WITHOUT CONTRAST TECHNIQUE: Contiguous axial images were obtained from the base of the skull through the vertex without intravenous contrast. RADIATION DOSE REDUCTION: This exam was performed according to the departmental dose-optimization program which includes automated exposure control, adjustment of the mA and/or kV according to patient size and/or use of iterative reconstruction technique. COMPARISON:  06/18/2022 at 3:55 p.m. FINDINGS: Brain: Posterior left convexity subdural hematoma is unchanged measuring 6 mm. There is a new focus of subarachnoid hemorrhage measuring approximately 6 mm within the right sylvian fissure. No midline shift or other mass effect. Vascular: Calcific atherosclerosis of the carotid and vertebral  arteries at skull base. Skull: Negative Sinuses/Orbits: Opacification of the right frontal and anterior ethmoid sinuses. The orbits are normal. Other: None IMPRESSION: 1. Unchanged left convexity subdural hematoma. 2. New focus of subarachnoid hemorrhage within the right Sylvian fissure. Electronically Signed   By: Deatra Robinson M.D.   On: 06/18/2022 22:51   DG Chest Port 1 View  Result Date: 06/18/2022 CLINICAL DATA:  Hypoxia. Tachycardia. EXAM: PORTABLE CHEST 1 VIEW COMPARISON:  Remote chest radiograph 02/12/2006. Lung bases from abdominal CT 04/13/2022 FINDINGS: There is patchy airspace disease in the left greater than right lung base. The heart is normal in size. Aortic atherosclerosis. Biapical pleuroparenchymal scarring. No pleural fluid, pneumothorax, or pulmonary edema. Right chest wall/axillary surgical clips. Left shoulder arthropathy. IMPRESSION: Patchy airspace disease in the left greater than right lung base, suspicious for pneumonia. Electronically Signed   By: Narda Rutherford M.D.   On: 06/18/2022 21:06   DG Shoulder Left  Result Date: 06/18/2022 CLINICAL DATA:  Post fall, now with left shoulder pain. EXAM: LEFT SHOULDER - 2+ VIEW COMPARISON:  None Available. FINDINGS: No fracture or dislocation. Severe degenerative change of the left glenohumeral joint with near complete joint space loss, subchondral sclerosis and osteophytosis. Suspected loose bodies are noted about the superior and inferior aspect of the glenohumeral joint space though discrete donor sites are not identified. Acromioclavicular joint spaces appear preserved. Crescentic calcification about the superior aspect of the left humeral head may represent the sequela of calcific tendinitis versus adhesive capsulitis. Limited visualization of the adjacent thorax demonstrates atherosclerotic plaque within the aortic arch. Regional soft tissues appear normal. IMPRESSION: 1. No acute findings. 2. Severe degenerative change of the left  glenohumeral joint with suspected loose bodies though discrete donor sites are not identified. 3. Eccentric calcifications about the superior aspect of the left humeral head may represent the sequela of calcific tendinitis versus adhesive capsulitis. Electronically Signed   By: Simonne Come M.D.   On: 06/18/2022 16:49   CT HEAD WO CONTRAST ( )  Result Date: 06/18/2022 CLINICAL DATA:  Head trauma, minor (Age >= 65y); Facial trauma, blunt; Neck trauma (Age >= 65y). Fall. EXAM: CT HEAD WITHOUT CONTRAST CT MAXILLOFACIAL WITHOUT CONTRAST CT CERVICAL SPINE WITHOUT CONTRAST TECHNIQUE:  Multidetector CT imaging of the head, cervical spine, and maxillofacial structures were performed using the standard protocol without intravenous contrast. Multiplanar CT image reconstructions of the cervical spine and maxillofacial structures were also generated. RADIATION DOSE REDUCTION: This exam was performed according to the departmental dose-optimization program which includes automated exposure control, adjustment of the mA and/or kV according to patient size and/or use of iterative reconstruction technique. COMPARISON:  Head MRI 12/05/2014 FINDINGS: CT HEAD FINDINGS Brain: A small acute subdural hematoma over the left cerebral convexity measures up to 5 mm in thickness without significant mass effect. No acute infarct, mass, or midline shift is identified. There is mild cerebral atrophy. Vascular: Calcified atherosclerosis at the skull base. Skull: No acute fracture or suspicious osseous lesion. Other: None. CT MAXILLOFACIAL FINDINGS Osseous: No acute fracture or mandibular dislocation. Bilateral TMJ arthropathy with condylar flattening. Orbits: Bilateral cataract extraction. Sinuses: Complete opacification of the right frontal sinus and right anterior and mid ethmoid air cells. Clear mastoid air cells. Soft tissues: Unremarkable. CT CERVICAL SPINE FINDINGS Alignment: Reversal of the normal cervical lordosis. Grade 1  anterolisthesis of C3 on C4, C7 on T1, and T1 on T2. Trace retrolisthesis of C5 on C6. Mild left convex curvature of the cervical spine. Skull base and vertebrae: Moderately advanced median C1-2 arthropathy. No acute fracture or suspicious osseous lesion. Soft tissues and spinal canal: No prevertebral fluid or swelling. No visible canal hematoma. Disc levels: Diffuse disc degeneration, most advanced at C4-5. Advanced multilevel facet arthrosis. No evidence of high-grade spinal canal stenosis. Mild-to-moderate left neural foraminal stenosis at C5-6. Upper chest: Biapical pleuroparenchymal lung scarring. Other: Moderate atherosclerotic calcification at the carotid bifurcations. Critical Value/emergent results were called by telephone at the time of interpretation on 06/18/2022 at 4:20 pm to Dr. Cyril Loosen, who verbally acknowledged these results. IMPRESSION: 1. Small acute left-sided subdural hematoma without significant mass effect. 2. No acute maxillofacial or cervical spine fracture. Electronically Signed   By: Sebastian Ache M.D.   On: 06/18/2022 16:20   CT Cervical Spine Wo Contrast  Result Date: 06/18/2022 CLINICAL DATA:  Head trauma, minor (Age >= 65y); Facial trauma, blunt; Neck trauma (Age >= 65y). Fall. EXAM: CT HEAD WITHOUT CONTRAST CT MAXILLOFACIAL WITHOUT CONTRAST CT CERVICAL SPINE WITHOUT CONTRAST TECHNIQUE: Multidetector CT imaging of the head, cervical spine, and maxillofacial structures were performed using the standard protocol without intravenous contrast. Multiplanar CT image reconstructions of the cervical spine and maxillofacial structures were also generated. RADIATION DOSE REDUCTION: This exam was performed according to the departmental dose-optimization program which includes automated exposure control, adjustment of the mA and/or kV according to patient size and/or use of iterative reconstruction technique. COMPARISON:  Head MRI 12/05/2014 FINDINGS: CT HEAD FINDINGS Brain: A small acute subdural  hematoma over the left cerebral convexity measures up to 5 mm in thickness without significant mass effect. No acute infarct, mass, or midline shift is identified. There is mild cerebral atrophy. Vascular: Calcified atherosclerosis at the skull base. Skull: No acute fracture or suspicious osseous lesion. Other: None. CT MAXILLOFACIAL FINDINGS Osseous: No acute fracture or mandibular dislocation. Bilateral TMJ arthropathy with condylar flattening. Orbits: Bilateral cataract extraction. Sinuses: Complete opacification of the right frontal sinus and right anterior and mid ethmoid air cells. Clear mastoid air cells. Soft tissues: Unremarkable. CT CERVICAL SPINE FINDINGS Alignment: Reversal of the normal cervical lordosis. Grade 1 anterolisthesis of C3 on C4, C7 on T1, and T1 on T2. Trace retrolisthesis of C5 on C6. Mild left convex curvature of the cervical spine. Skull  base and vertebrae: Moderately advanced median C1-2 arthropathy. No acute fracture or suspicious osseous lesion. Soft tissues and spinal canal: No prevertebral fluid or swelling. No visible canal hematoma. Disc levels: Diffuse disc degeneration, most advanced at C4-5. Advanced multilevel facet arthrosis. No evidence of high-grade spinal canal stenosis. Mild-to-moderate left neural foraminal stenosis at C5-6. Upper chest: Biapical pleuroparenchymal lung scarring. Other: Moderate atherosclerotic calcification at the carotid bifurcations. Critical Value/emergent results were called by telephone at the time of interpretation on 06/18/2022 at 4:20 pm to Dr. Cyril Loosen, who verbally acknowledged these results. IMPRESSION: 1. Small acute left-sided subdural hematoma without significant mass effect. 2. No acute maxillofacial or cervical spine fracture. Electronically Signed   By: Sebastian Ache M.D.   On: 06/18/2022 16:20   CT Maxillofacial Wo Contrast  Result Date: 06/18/2022 CLINICAL DATA:  Head trauma, minor (Age >= 65y); Facial trauma, blunt; Neck trauma (Age  >= 65y). Fall. EXAM: CT HEAD WITHOUT CONTRAST CT MAXILLOFACIAL WITHOUT CONTRAST CT CERVICAL SPINE WITHOUT CONTRAST TECHNIQUE: Multidetector CT imaging of the head, cervical spine, and maxillofacial structures were performed using the standard protocol without intravenous contrast. Multiplanar CT image reconstructions of the cervical spine and maxillofacial structures were also generated. RADIATION DOSE REDUCTION: This exam was performed according to the departmental dose-optimization program which includes automated exposure control, adjustment of the mA and/or kV according to patient size and/or use of iterative reconstruction technique. COMPARISON:  Head MRI 12/05/2014 FINDINGS: CT HEAD FINDINGS Brain: A small acute subdural hematoma over the left cerebral convexity measures up to 5 mm in thickness without significant mass effect. No acute infarct, mass, or midline shift is identified. There is mild cerebral atrophy. Vascular: Calcified atherosclerosis at the skull base. Skull: No acute fracture or suspicious osseous lesion. Other: None. CT MAXILLOFACIAL FINDINGS Osseous: No acute fracture or mandibular dislocation. Bilateral TMJ arthropathy with condylar flattening. Orbits: Bilateral cataract extraction. Sinuses: Complete opacification of the right frontal sinus and right anterior and mid ethmoid air cells. Clear mastoid air cells. Soft tissues: Unremarkable. CT CERVICAL SPINE FINDINGS Alignment: Reversal of the normal cervical lordosis. Grade 1 anterolisthesis of C3 on C4, C7 on T1, and T1 on T2. Trace retrolisthesis of C5 on C6. Mild left convex curvature of the cervical spine. Skull base and vertebrae: Moderately advanced median C1-2 arthropathy. No acute fracture or suspicious osseous lesion. Soft tissues and spinal canal: No prevertebral fluid or swelling. No visible canal hematoma. Disc levels: Diffuse disc degeneration, most advanced at C4-5. Advanced multilevel facet arthrosis. No evidence of high-grade  spinal canal stenosis. Mild-to-moderate left neural foraminal stenosis at C5-6. Upper chest: Biapical pleuroparenchymal lung scarring. Other: Moderate atherosclerotic calcification at the carotid bifurcations. Critical Value/emergent results were called by telephone at the time of interpretation on 06/18/2022 at 4:20 pm to Dr. Cyril Loosen, who verbally acknowledged these results. IMPRESSION: 1. Small acute left-sided subdural hematoma without significant mass effect. 2. No acute maxillofacial or cervical spine fracture. Electronically Signed   By: Sebastian Ache M.D.   On: 06/18/2022 16:20    Microbiology: Results for orders placed or performed during the hospital encounter of 06/18/22  Respiratory (~20 pathogens) panel by PCR     Status: Abnormal   Collection Time: 06/18/22  8:45 PM   Specimen: Nasopharyngeal Swab; Respiratory  Result Value Ref Range Status   Adenovirus NOT DETECTED NOT DETECTED Final   Coronavirus 229E NOT DETECTED NOT DETECTED Final    Comment: (NOTE) The Coronavirus on the Respiratory Panel, DOES NOT test for the novel  Coronavirus (2019  nCoV)    Coronavirus HKU1 NOT DETECTED NOT DETECTED Final   Coronavirus NL63 NOT DETECTED NOT DETECTED Final   Coronavirus OC43 NOT DETECTED NOT DETECTED Final   Metapneumovirus DETECTED (A) NOT DETECTED Final   Rhinovirus / Enterovirus NOT DETECTED NOT DETECTED Final   Influenza A NOT DETECTED NOT DETECTED Final   Influenza B NOT DETECTED NOT DETECTED Final   Parainfluenza Virus 1 NOT DETECTED NOT DETECTED Final   Parainfluenza Virus 2 NOT DETECTED NOT DETECTED Final   Parainfluenza Virus 3 NOT DETECTED NOT DETECTED Final   Parainfluenza Virus 4 NOT DETECTED NOT DETECTED Final   Respiratory Syncytial Virus NOT DETECTED NOT DETECTED Final   Bordetella pertussis NOT DETECTED NOT DETECTED Final   Bordetella Parapertussis NOT DETECTED NOT DETECTED Final   Chlamydophila pneumoniae NOT DETECTED NOT DETECTED Final   Mycoplasma pneumoniae NOT  DETECTED NOT DETECTED Final    Comment: Performed at [facility], [address], [place], [place] [address]  MRSA Next Gen by PCR, Nasal     Status: None   Collection Time: 06/21/22  4:00 PM   Specimen: Nasal Mucosa; Nasal Swab  Result Value Ref Range Status   MRSA by PCR Next Gen NOT DETECTED NOT DETECTED Final    Comment: (NOTE) The GeneXpert MRSA Assay (FDA approved for NASAL specimens only), is one component of a comprehensive MRSA colonization surveillance program. It is not intended to diagnose MRSA infection nor to guide or monitor treatment for MRSA infections. Test performance is not FDA approved in patients less than 60 years old. Performed at [facility], [address] [place], [place] [address]     Labs: CBC: Recent Labs  Lab 07/09/22 [phone] 07/10/22 0427 07/11/22 0405 07/12/22 0359 07/14/22 0943  WBC 4.8 5.7 5.8 8.4 5.4  NEUTROABS  --   --   --   --  3.5  HGB 11.1* 11.1* 12.1 11.5* 12.6  HCT 31.9* 32.5* 35.9* 34.2* 37.3  MCV 95.2 93.1 96.2 94.0 95.4  PLT 330 351 364 343 372   Basic Metabolic Panel: Recent Labs  Lab 07/09/22 0314 07/10/22 0427 07/11/22 0405 07/12/22 0359 07/14/22 0943  NA 126* 128* 131* 129* 132*  K 4.1 4.4 4.1 3.9 4.4  CL 92* 95* 96* 93* 97*  CO2 26 26 26 27 27   GLUCOSE 100* 97 99 115* 91  BUN 10 15 13 12 10   CREATININE 0.64 0.77 0.71 0.85 0.74  CALCIUM 8.5* 8.4* 8.8* 8.8* 9.0  MG  --   --   --   --  2.1  PHOS  --   --   --   --  3.2   Liver Function Tests: Recent Labs  Lab 07/14/22 0943  AST 31  ALT 25  ALKPHOS 128*  BILITOT 0.5  PROT 5.8*  ALBUMIN 2.6*   CBG: No results for input(s): "GLUCAP" in the last 168 hours.  Discharge time spent: greater than 30 minutes.  Signed: Thad Ranger, MD [facility] 07/14/2022

## 2022-07-14 NOTE — Progress Notes (Signed)
SLP Cancellation Note  Patient Details Name: FANTAZIA CLURE MRN: [id number] DOB: [birth year]   Cancelled treatment:       Reason Eval/Treat Not Completed: Other (comment) (Pt has been transitioned to full comfort. SLP will sign off at this time.)  Madolin Twaddle I. Vear Clock, MS, CCC-SLP Neuro Diagnostic Specialist  Acute Rehabilitation Services Office number: [phone]  Scheryl Marten 07/14/2022, 8:58 AM

## 2022-07-14 NOTE — Progress Notes (Addendum)
Neurology Progress Note  Patient ID: Laura Carpenter is a 87 y.o. with past medical history significant for recent left-sided subdural hemorrhage, Alzheimer's dementia, chronic hyponatremia, hypertension, hyperlipidemia, thyroid disorder, fibromuscular dysplasia who comes in from [facility] for abdominal pain and distention. She was noted to be altered and to have R sided twitching which is new for her (6/8).  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   After treatment of her seizures her mental status had improved to being oriented to self and place as well as hospital but not time, following simple one-step commands but not consistently at 07/06/2022, and she had antigravity strength in all 4 extremities.   Subjective: - We are now reconsulted for recurrent right foot twitching, which appears to have started sometime yesterday intermittently, progressing to continuous today - She does not appear to be in any acute distress, but aphasia limits examination  Exam: Vitals:   07/13/22 2109 07/14/22 0851  BP: 137/82 119/77  Pulse:  60  Resp:  14  Temp:  98.7 F (37.1 C)  SpO2:  96%   Gen: In bed, comfortable  Resp: non-labored breathing, no grossly audible wheezing Cardiac: Perfusing extremities well  Abd: soft, nt  Neuro: MS: Sleeping but opens eyes to voice readily.  Mimics but does not follow commands.  States "is it time for my medications" but does not answer any orientation questions including name CN: Tracks examiner bilaterally, pupils equal round reactive to light, face symmetric, tongue midline on mimicing Motor/sensory: The right side is relatively weaker than the left.  Continuous motor twitching of the right foot, but able to withdraw foot slightly to tickle.  Right upper extremity quickly drifts to the bed.  Left upper extremity drifts more slowly.  Left lower  extremity withdraws more quickly to light tickle in the right, neither lower extremities antigravity DTR: 3+ right patellar, 2+ left patellar  Pertinent Labs:  Basic Metabolic Panel: Recent Labs  Lab 07/08/22 0437 07/09/22 0314 07/10/22 0427 07/11/22 0405 07/12/22 0359  NA 126* 126* 128* 131* 129*  K 3.9 4.1 4.4 4.1 3.9  CL 92* 92* 95* 96* 93*  CO2 26 26 26 26 27   GLUCOSE 102* 100* 97 99 115*  BUN 9 10 15 13 12   CREATININE 0.72 0.64 0.77 0.71 0.85  CALCIUM 8.9 8.5* 8.4* 8.8* 8.8*    CBC: Recent Labs  Lab 07/08/22 0437 07/09/22 0314 07/10/22 0427 07/11/22 0405 07/12/22 0359  WBC 5.0 4.8 5.7 5.8 8.4  HGB 13.4 11.1* 11.1* 12.1 11.5*  HCT 38.5 31.9* 32.5* 35.9* 34.2*  MCV 92.3 95.2 93.1 96.2 94.0  PLT 399 330 351 364 343    Coagulation Studies: No results for input(s): "LABPROT", "INR" in the last 72 hours.    Impression: Focal motor status in a patient who is comfort care.  Given this can progress further and become uncomfortable, discussed with Dr. Isidoro Donning and will pursue a toxic/metabolic/infectious workup to try to treat any etiology that is lowering the seizure threshold.  If no etiology is identified, may try addition of low-dose benzodiazepine given she has been very sleepy with Keppra  Recommendations: -CBC, CMP, magnesium, Phos levels -UA, chest x-ray -Continue Keppra 500 mg twice daily for now -Neurology will follow along, thank you for involving [place] in care of this patient  Addendum:  -No clear seizure trigger on workup, patient planned for discharge home today -  Exam stable from this morning -Added clonzepam 0.125 mg ODT BID; this may be gradually increased by hospice team for seizure control as sedation allows -Ativan liquid 2 mg as needed for generalized seizure for rescue medication -Family and Dr. Isidoro Donning updated by phone (Dr. Isidoro Donning additionally updated via secure chat)  Brooke Dare MD-PhD [facility] [phone]  Available 7 AM to 7 PM,  outside these hours please contact Neurologist on call listed on AMION   Greater than 50 min spent in care of patient

## 2022-07-14 NOTE — Progress Notes (Signed)
Nursing Discharge Note  Name: VIRGINIA GEARHEART MRN: [id number]  Admit Date: 07/03/2022 Discharge date: 07/14/2022   Jahnee Kubiak is to be discharged home with hospice care services. Non-emergency ambulance transport arranged. Education provided to patient's daughter, Almyra Free by Discharge Nurse Gust Brooms.   Allergies as of 07/14/2022       Reactions   Aricept [donepezil Hcl]    GI upset         Medication List     STOP taking these medications    amLODipine 2.5 MG tablet Commonly known as: NORVASC   amlodipine-benazepril 2.5-10 MG capsule Commonly known as: LOTREL   aspirin 81 MG tablet   atorvastatin 10 MG tablet Commonly known as: LIPITOR   benazepril 10 MG tablet Commonly known as: LOTENSIN   Vitamin D3 50 MCG (2000 UT) Tabs       TAKE these medications    amiodarone 200 MG tablet Commonly known as: PACERONE Take 1 tablet (200 mg total) by mouth daily. Start taking on: July 15, 2022   amoxicillin-clavulanate 875-125 MG tablet Commonly known as: AUGMENTIN Take 1 tablet by mouth 2 (two) times daily for 4 days.   clonazepam 0.125 MG disintegrating tablet Commonly known as: KLONOPIN Take 1 tablet (0.125 mg total) by mouth 2 (two) times daily.   levETIRAcetam 500 MG tablet Commonly known as: KEPPRA Take 1 tablet (500 mg total) by mouth 2 (two) times daily.   levothyroxine 125 MCG tablet Commonly known as: SYNTHROID Take 1 tablet (125 mcg total) by mouth daily before breakfast.   LORazepam 2 MG/ML concentrated solution Commonly known as: ATIVAN Take 1 mL (2 mg total) by mouth 2 (two) times daily as needed for seizure (Generalized seizure > 5 min or worsening seizure activity / discomfort).   mirtazapine 7.5 MG tablet Commonly known as: REMERON Take 7.5 mg by mouth at bedtime. Take 1 tablet daily   ondansetron 4 MG tablet Commonly known as: ZOFRAN Take 1 tablet (4 mg total) by mouth every 8 (eight) hours as needed for nausea or vomiting.   rivastigmine 9.5  mg/24hr Commonly known as: EXELON Place 9.5 mg onto the skin daily.   sodium chloride 1 g tablet Take 1 tablet (1 g total) by mouth 2 (two) times daily with a meal.   Tylenol 325 MG tablet Generic drug: acetaminophen Take 650 mg by mouth every 6 (six) hours as needed for mild pain or moderate pain.        Discharge Instructions     Ambulatory referral to Neurology   Complete by: As directed    An appointment is requested in approximately: 2 weeks, for focal seizures   Discharge instructions   Complete by: As directed    Dysphagia 2 diet with thin liquids   Increase activity slowly   Complete by: As directed        [facility] is to provide transportation home for patient. Non-emergency ambulance transport at bedside. Handoff completed with [facility] staff/EMTs.  Patient discharged from hospital unit via stretcher. Stable at time of discharge.

## 2022-07-14 NOTE — Progress Notes (Signed)
Discharge instructions given to patient's daughter (libby). She verbalized understanding and all questions were answered.

## 2022-10-25 DEATH — deceased
